# Patient Record
Sex: Male | Born: 1935 | Race: White | Hispanic: No | Marital: Married | Smoking: Former smoker
Health system: Southern US, Community
[De-identification: ages and names within clinical notes are randomized; demographics above are authoritative.]

## PROBLEM LIST (undated history)

## (undated) DIAGNOSIS — F0390 Unspecified dementia without behavioral disturbance: Secondary | ICD-10-CM

## (undated) DIAGNOSIS — K2270 Barrett's esophagus without dysplasia: Secondary | ICD-10-CM

## (undated) DIAGNOSIS — J45909 Unspecified asthma, uncomplicated: Secondary | ICD-10-CM

## (undated) DIAGNOSIS — I499 Cardiac arrhythmia, unspecified: Secondary | ICD-10-CM

## (undated) DIAGNOSIS — F1910 Other psychoactive substance abuse, uncomplicated: Secondary | ICD-10-CM

## (undated) DIAGNOSIS — J449 Chronic obstructive pulmonary disease, unspecified: Secondary | ICD-10-CM

## (undated) DIAGNOSIS — M549 Dorsalgia, unspecified: Secondary | ICD-10-CM

## (undated) DIAGNOSIS — R1310 Dysphagia, unspecified: Secondary | ICD-10-CM

## (undated) DIAGNOSIS — K219 Gastro-esophageal reflux disease without esophagitis: Secondary | ICD-10-CM

## (undated) DIAGNOSIS — F32A Depression, unspecified: Secondary | ICD-10-CM

## (undated) DIAGNOSIS — I509 Heart failure, unspecified: Secondary | ICD-10-CM

## (undated) DIAGNOSIS — C801 Malignant (primary) neoplasm, unspecified: Secondary | ICD-10-CM

## (undated) DIAGNOSIS — F329 Major depressive disorder, single episode, unspecified: Secondary | ICD-10-CM

## (undated) DIAGNOSIS — I10 Essential (primary) hypertension: Secondary | ICD-10-CM

## (undated) DIAGNOSIS — Z950 Presence of cardiac pacemaker: Secondary | ICD-10-CM

## (undated) DIAGNOSIS — F419 Anxiety disorder, unspecified: Secondary | ICD-10-CM

## (undated) HISTORY — PX: UPPER GASTROINTESTINAL ENDOSCOPY: SHX188

## (undated) HISTORY — PX: ANKLE SURGERY: SHX546

## (undated) HISTORY — PX: COLONOSCOPY: SHX174

## (undated) HISTORY — PX: PACEMAKER INSERTION: SHX728

## (undated) HISTORY — DX: Other psychoactive substance abuse, uncomplicated: F19.10

## (undated) HISTORY — DX: Major depressive disorder, single episode, unspecified: F32.9

## (undated) HISTORY — PX: TONSILLECTOMY: SUR1361

## (undated) HISTORY — DX: Depression, unspecified: F32.A

## (undated) HISTORY — DX: Gastro-esophageal reflux disease without esophagitis: K21.9

## (undated) HISTORY — DX: Malignant (primary) neoplasm, unspecified: C80.1

## (undated) HISTORY — DX: Anxiety disorder, unspecified: F41.9

## (undated) HISTORY — PX: SKIN GRAFT: SHX250

## (undated) HISTORY — PX: ESOPHAGEAL DILATION: SHX303

## (undated) HISTORY — PX: NECK SURGERY: SHX720

---

## 2007-03-27 DIAGNOSIS — K430 Incisional hernia with obstruction, without gangrene: Secondary | ICD-10-CM | POA: Insufficient documentation

## 2012-03-23 ENCOUNTER — Encounter (HOSPITAL_COMMUNITY): Payer: Self-pay | Admitting: Emergency Medicine

## 2012-03-23 ENCOUNTER — Emergency Department (HOSPITAL_COMMUNITY): Payer: Medicare Other

## 2012-03-23 ENCOUNTER — Observation Stay (HOSPITAL_COMMUNITY)
Admission: EM | Admit: 2012-03-23 | Discharge: 2012-03-24 | Disposition: A | Discharge status: Home / Self Care | Payer: Medicare Other | Attending: Internal Medicine | Admitting: Internal Medicine

## 2012-03-23 DIAGNOSIS — R4182 Altered mental status, unspecified: Secondary | ICD-10-CM | POA: Diagnosis present

## 2012-03-23 DIAGNOSIS — J440 Chronic obstructive pulmonary disease with acute lower respiratory infection: Secondary | ICD-10-CM | POA: Insufficient documentation

## 2012-03-23 DIAGNOSIS — M48061 Spinal stenosis, lumbar region without neurogenic claudication: Secondary | ICD-10-CM | POA: Diagnosis present

## 2012-03-23 DIAGNOSIS — Z950 Presence of cardiac pacemaker: Secondary | ICD-10-CM | POA: Insufficient documentation

## 2012-03-23 DIAGNOSIS — R404 Transient alteration of awareness: Secondary | ICD-10-CM | POA: Insufficient documentation

## 2012-03-23 DIAGNOSIS — J449 Chronic obstructive pulmonary disease, unspecified: Secondary | ICD-10-CM

## 2012-03-23 DIAGNOSIS — M5416 Radiculopathy, lumbar region: Secondary | ICD-10-CM

## 2012-03-23 DIAGNOSIS — S2249XA Multiple fractures of ribs, unspecified side, initial encounter for closed fracture: Principal | ICD-10-CM | POA: Diagnosis present

## 2012-03-23 DIAGNOSIS — Y92009 Unspecified place in unspecified non-institutional (private) residence as the place of occurrence of the external cause: Secondary | ICD-10-CM | POA: Insufficient documentation

## 2012-03-23 DIAGNOSIS — W108XXA Fall (on) (from) other stairs and steps, initial encounter: Secondary | ICD-10-CM | POA: Diagnosis present

## 2012-03-23 DIAGNOSIS — I10 Essential (primary) hypertension: Secondary | ICD-10-CM | POA: Diagnosis present

## 2012-03-23 DIAGNOSIS — Z981 Arthrodesis status: Secondary | ICD-10-CM | POA: Insufficient documentation

## 2012-03-23 DIAGNOSIS — J209 Acute bronchitis, unspecified: Secondary | ICD-10-CM | POA: Insufficient documentation

## 2012-03-23 DIAGNOSIS — F0390 Unspecified dementia without behavioral disturbance: Secondary | ICD-10-CM | POA: Insufficient documentation

## 2012-03-23 HISTORY — DX: Chronic obstructive pulmonary disease, unspecified: J44.9

## 2012-03-23 HISTORY — DX: Essential (primary) hypertension: I10

## 2012-03-23 HISTORY — DX: Unspecified dementia, unspecified severity, without behavioral disturbance, psychotic disturbance, mood disturbance, and anxiety: F03.90

## 2012-03-23 HISTORY — DX: Dorsalgia, unspecified: M54.9

## 2012-03-23 HISTORY — DX: Presence of cardiac pacemaker: Z95.0

## 2012-03-23 HISTORY — DX: Unspecified asthma, uncomplicated: J45.909

## 2012-03-23 LAB — CBC WITH DIFFERENTIAL/PLATELET
Basophils Absolute: 0.1 10*3/uL (ref 0.0–0.1)
Basophils Relative: 1 % (ref 0–1)
Eosinophils Absolute: 0.4 10*3/uL (ref 0.0–0.7)
Eosinophils Relative: 3 % (ref 0–5)
HCT: 41.5 % (ref 39.0–52.0)
Hemoglobin: 14.2 g/dL (ref 13.0–17.0)
Lymphocytes Relative: 29 % (ref 12–46)
Lymphs Abs: 3.2 10*3/uL (ref 0.7–4.0)
MCH: 31.8 pg (ref 26.0–34.0)
MCHC: 34.2 g/dL (ref 30.0–36.0)
MCV: 92.8 fL (ref 78.0–100.0)
Monocytes Absolute: 0.9 10*3/uL (ref 0.1–1.0)
Monocytes Relative: 8 % (ref 3–12)
Neutro Abs: 6.6 10*3/uL (ref 1.7–7.7)
Neutrophils Relative %: 59 % (ref 43–77)
Platelets: 328 10*3/uL (ref 150–400)
RBC: 4.47 MIL/uL (ref 4.22–5.81)
RDW: 15.3 % (ref 11.5–15.5)
WBC: 11.2 10*3/uL — ABNORMAL HIGH (ref 4.0–10.5)

## 2012-03-23 LAB — COMPREHENSIVE METABOLIC PANEL WITH GFR
ALT: 21 U/L (ref 0–53)
AST: 32 U/L (ref 0–37)
Albumin: 3.9 g/dL (ref 3.5–5.2)
Alkaline Phosphatase: 125 U/L — ABNORMAL HIGH (ref 39–117)
BUN: 19 mg/dL (ref 6–23)
CO2: 23 meq/L (ref 19–32)
Calcium: 10.2 mg/dL (ref 8.4–10.5)
Chloride: 97 meq/L (ref 96–112)
Creatinine, Ser: 0.82 mg/dL (ref 0.50–1.35)
GFR calc Af Amer: >90 mL/min
GFR calc non Af Amer: 84 mL/min — ABNORMAL LOW
Glucose, Bld: 126 mg/dL — ABNORMAL HIGH (ref 70–99)
Potassium: 4 meq/L (ref 3.5–5.1)
Sodium: 138 meq/L (ref 135–145)
Total Bilirubin: 0.3 mg/dL (ref 0.3–1.2)
Total Protein: 7.7 g/dL (ref 6.0–8.3)

## 2012-03-23 LAB — PROTIME-INR
INR: 1.03 (ref 0.00–1.49)
Prothrombin Time: 13.4 seconds (ref 11.6–15.2)

## 2012-03-23 MED ORDER — HYDROCODONE-ACETAMINOPHEN 5-325 MG PO TABS
2.0000 | ORAL_TABLET | ORAL | Status: DC | PRN
Start: 2012-03-23 — End: 2012-03-24

## 2012-03-23 MED ORDER — ALBUTEROL SULFATE (5 MG/ML) 0.5% IN NEBU
5.0000 mg | INHALATION_SOLUTION | Freq: Once | RESPIRATORY_TRACT | Status: AC
  Administered 2012-03-23: 5 mg via RESPIRATORY_TRACT

## 2012-03-23 MED ORDER — LORAZEPAM 2 MG/ML IJ SOLN
INTRAMUSCULAR | Status: AC
  Filled 2012-03-23: qty 1

## 2012-03-23 MED ORDER — LORAZEPAM 2 MG/ML IJ SOLN
0.5000 mg | Freq: Once | INTRAMUSCULAR | Status: AC
  Administered 2012-03-23: 0.5 mg via INTRAVENOUS

## 2012-03-23 MED ORDER — ALBUTEROL SULFATE (5 MG/ML) 0.5% IN NEBU
INHALATION_SOLUTION | RESPIRATORY_TRACT | Status: AC
  Filled 2012-03-23: qty 1

## 2012-03-23 NOTE — ED Notes (Signed)
Dr Delo to bedside 

## 2012-03-23 NOTE — ED Notes (Signed)
To ED from home via EMS for fall down unknown number of steps, ?LOC, pt able to asn questions, will follow commands, pt has dementia, pt moaning on arrival, pt vomited pta, no obvious truama or deformity, 208/116, ST 108, 18g left AC, is on LSB, CBG 105

## 2012-03-23 NOTE — ED Provider Notes (Signed)
History     CSN: [id number]  Arrival date & time 03/23/12  2140   First MD Initiated Contact with Patient 03/23/12 2147      Chief Complaint  Patient presents with  . Fall    (Consider location/radiation/quality/duration/timing/severity/associated sxs/prior treatment) Patient is a 78 y.o. male presenting with injury. The history is provided by the patient. No language interpreter was used.  Injury  The incident occurred just prior to arrival. The incident occurred at home. The injury mechanism was a fall. Context: stairs. The wounds were self-inflicted. No protective equipment was used. He came to the ER via EMS. There is an injury to the head. There is an injury to the chest. The pain is moderate. Associated symptoms include chest pain. Pertinent negatives include no abdominal pain, no nausea, no vomiting, no headaches and no cough.    Past Medical History  Diagnosis Date  . Asthma   . Dementia   . Back pain   . COPD (chronic obstructive pulmonary disease)   . Hypertension   . Pacemaker     Past Surgical History  Procedure Date  . Neck surgery     No family history on file.  History  Substance Use Topics  . Smoking status: Never Smoker   . Smokeless tobacco: Not on file  . Alcohol Use: Yes      Review of Systems  Constitutional: Negative for fever and chills.  HENT: Negative for congestion and sore throat.   Respiratory: Positive for shortness of breath. Negative for cough.   Cardiovascular: Positive for chest pain. Negative for leg swelling.  Gastrointestinal: Negative for nausea, vomiting, abdominal pain, diarrhea and constipation.  Genitourinary: Negative for dysuria and frequency.  Musculoskeletal: Positive for back pain.  Skin: Negative for color change and rash.  Neurological: Negative for dizziness and headaches.  Psychiatric/Behavioral: Negative for confusion and agitation.  All other systems reviewed and are negative.    Allergies  Review of  patient's allergies indicates not on file.  Home Medications  No current outpatient prescriptions on file.  BP 210/100  Pulse 114  Temp 96.6 F (35.9 C) (Rectal)  Resp 20  SpO2 96%  Physical Exam  Constitutional: He is oriented to person, place, and time. He appears well-developed and well-nourished. No distress.  HENT:  Head: Normocephalic and atraumatic.  Eyes: EOM are normal. Pupils are equal, round, and reactive to light.  Neck: Normal range of motion. Neck supple.  Cardiovascular: Normal rate and regular rhythm.     Pulmonary/Chest: Effort normal. No respiratory distress.  Abdominal: Soft. He exhibits no distension.  Musculoskeletal: Normal range of motion. He exhibits no edema.  Neurological: He is alert and oriented to person, place, and time. He has normal strength. No cranial nerve deficit. GCS eye subscore is 4. GCS verbal subscore is 4. GCS motor subscore is 6.  Skin: Skin is warm and dry.  Psychiatric: He has a normal mood and affect. His behavior is normal.    ED Course  Procedures (including critical care time)   Labs Reviewed  PROTIME-INR  CBC WITH DIFFERENTIAL  COMPREHENSIVE METABOLIC PANEL   Results for orders placed during the hospital encounter of 03/23/12  PROTIME-INR      Component Value Range   Prothrombin Time 13.4  11.6 - 15.2 seconds   INR 1.03  0.00 - 1.49  CBC WITH DIFFERENTIAL      Component Value Range   WBC 11.2 (*) 4.0 - 10.5 K/uL   RBC 4.47  4.22 -  5.81 MIL/uL   Hemoglobin 14.2  13.0 - 17.0 g/dL   HCT 16.1  09.6 - 04.5 %   MCV 92.8  78.0 - 100.0 fL   MCH 31.8  26.0 - 34.0 pg   MCHC 34.2  30.0 - 36.0 g/dL   RDW 40.9  81.1 - 91.4 %   Platelets 328  150 - 400 K/uL   Neutrophils Relative 59  43 - 77 %   Neutro Abs 6.6  1.7 - 7.7 K/uL   Lymphocytes Relative 29  12 - 46 %   Lymphs Abs 3.2  0.7 - 4.0 K/uL   Monocytes Relative 8  3 - 12 %   Monocytes Absolute 0.9  0.1 - 1.0 K/uL   Eosinophils Relative 3  0 - 5 %   Eosinophils  Absolute 0.4  0.0 - 0.7 K/uL   Basophils Relative 1  0 - 1 %   Basophils Absolute 0.1  0.0 - 0.1 K/uL  COMPREHENSIVE METABOLIC PANEL      Component Value Range   Sodium 138  135 - 145 mEq/L   Potassium 4.0  3.5 - 5.1 mEq/L   Chloride 97  96 - 112 mEq/L   CO2 23  19 - 32 mEq/L   Glucose, Bld 126 (*) 70 - 99 mg/dL   BUN 19  6 - 23 mg/dL   Creatinine, Ser 7.82  0.50 - 1.35 mg/dL   Calcium 95.6  8.4 - 21.3 mg/dL   Total Protein 7.7  6.0 - 8.3 g/dL   Albumin 3.9  3.5 - 5.2 g/dL   AST 32  0 - 37 U/L   ALT 21  0 - 53 U/L   Alkaline Phosphatase 125 (*) 39 - 117 U/L   Total Bilirubin 0.3  0.3 - 1.2 mg/dL   GFR calc non Af Amer 84 (*) >90 mL/min   GFR calc Af Amer >90  >90 mL/min    CT Head Wo Contrast (Final result)   Result time:03/23/12 2253    Final result by Rad Results In Interface (03/23/12 22:53:35)    Narrative:   *RADIOLOGY REPORT*  Clinical Data: Status post fall down steps; question of loss of consciousness. Vomiting. Concern for head or cervical spine injury.  CT HEAD WITHOUT CONTRAST AND CT CERVICAL SPINE WITHOUT CONTRAST  Technique: Multidetector CT imaging of the head and cervical spine was performed following the standard protocol without intravenous contrast. Multiplanar CT image reconstructions of the cervical spine were also generated.  Comparison: None  CT HEAD  Findings: There is no evidence of acute infarction, mass lesion, or intra- or extra-axial hemorrhage on CT. Evaluation is suboptimal due to motion artifact.  Prominence of the ventricles and sulci reflects mild cortical volume loss. Scattered periventricular white matter change likely reflects small vessel ischemic microangiopathy. Mild cerebellar atrophy is noted.  The brainstem and fourth ventricle are within normal limits. The basal ganglia are unremarkable in appearance. The cerebral hemispheres demonstrate grossly normal gray-white differentiation. No mass effect or midline shift is  seen.  There is no evidence of fracture; visualized osseous structures are unremarkable in appearance. The orbits are within normal limits. The paranasal sinuses and mastoid air cells are well-aerated. No significant soft tissue abnormalities are seen.  IMPRESSION:  1. No evidence of traumatic intracranial injury or fracture. 2. Mild cortical volume loss and scattered small vessel ischemic microangiopathy.  CT CERVICAL SPINE  Findings: There is no evidence of fracture or subluxation. Vertebral bodies demonstrate normal height and alignment.  The patient is status post anterior cervical spinal fusion at C3-C5, with an associated spacer element and partial resection of C4. The patient is also status post posterior cervical spinal fusion at C3- C7, with associated decompression. Prevertebral soft tissues are within normal limits. The right-sided screw at C7 appears to extend just barely into the right-sided neural foramen.  The visualized portions of the thyroid gland are grossly unremarkable in appearance. The visualized lung apices are clear. Calcification is noted at the carotid bifurcations bilaterally, extending more superiorly on the right side.  IMPRESSION:  1. No evidence of fracture or subluxation along the cervical spine. 2. Postoperative changes noted along the cervical spine, with fusion from C3-C7 and associated decompression. 3. Calcification at the carotid bifurcations bilaterally, extending more superiorly on the right side. Carotid ultrasound would be helpful for further evaluation, when and as deemed clinically appropriate.   Original Report Authenticated By: Tonia Ghent, M.D.             CT Cervical Spine Wo Contrast (Final result)   Result time:03/23/12 2253    Final result by Rad Results In Interface (03/23/12 22:53:35)    Narrative:   *RADIOLOGY REPORT*  Clinical Data: Status post fall down steps; question of loss of consciousness. Vomiting.  Concern for head or cervical spine injury.  CT HEAD WITHOUT CONTRAST AND CT CERVICAL SPINE WITHOUT CONTRAST  Technique: Multidetector CT imaging of the head and cervical spine was performed following the standard protocol without intravenous contrast. Multiplanar CT image reconstructions of the cervical spine were also generated.  Comparison: None  CT HEAD  Findings: There is no evidence of acute infarction, mass lesion, or intra- or extra-axial hemorrhage on CT. Evaluation is suboptimal due to motion artifact.  Prominence of the ventricles and sulci reflects mild cortical volume loss. Scattered periventricular white matter change likely reflects small vessel ischemic microangiopathy. Mild cerebellar atrophy is noted.  The brainstem and fourth ventricle are within normal limits. The basal ganglia are unremarkable in appearance. The cerebral hemispheres demonstrate grossly normal gray-white differentiation. No mass effect or midline shift is seen.  There is no evidence of fracture; visualized osseous structures are unremarkable in appearance. The orbits are within normal limits. The paranasal sinuses and mastoid air cells are well-aerated. No significant soft tissue abnormalities are seen.  IMPRESSION:  1. No evidence of traumatic intracranial injury or fracture. 2. Mild cortical volume loss and scattered small vessel ischemic microangiopathy.  CT CERVICAL SPINE  Findings: There is no evidence of fracture or subluxation. Vertebral bodies demonstrate normal height and alignment. The patient is status post anterior cervical spinal fusion at C3-C5, with an associated spacer element and partial resection of C4. The patient is also status post posterior cervical spinal fusion at C3- C7, with associated decompression. Prevertebral soft tissues are within normal limits. The right-sided screw at C7 appears to extend just barely into the right-sided neural foramen.  The  visualized portions of the thyroid gland are grossly unremarkable in appearance. The visualized lung apices are clear. Calcification is noted at the carotid bifurcations bilaterally, extending more superiorly on the right side.  IMPRESSION:  1. No evidence of fracture or subluxation along the cervical spine. 2. Postoperative changes noted along the cervical spine, with fusion from C3-C7 and associated decompression. 3. Calcification at the carotid bifurcations bilaterally, extending more superiorly on the right side. Carotid ultrasound would be helpful for further evaluation, when and as deemed clinically appropriate.   Original Report Authenticated By: Tonia Ghent, M.D.  DG Pelvis Portable (Final result)   Result time:03/23/12 2231    Final result by Rad Results In Interface (03/23/12 22:31:25)    Narrative:   *RADIOLOGY REPORT*  Clinical Data: Larey Seat down stairs, shortness of breath, asthma, COPD, hypertension, and pacemaker  PORTABLE PELVIS  Comparison: Portable exam 2214 hours without priors for comparison  Findings: Diffuse osseous demineralization. Symmetric hip and SI joints. No acute fracture, dislocation, or bone destruction. Corticated deformity at the lateral margin of the right iliac wing question sequela of prior surgery. Scattered vascular calcifications.  IMPRESSION: Osseous demineralization. No definite acute osseous abnormalities.   Original Report Authenticated By: Ulyses Southward, M.D.             DG Chest Portable 1 View (Final result)   Result time:03/23/12 2229    Final result by Rad Results In Interface (03/23/12 22:29:15)    Narrative:   *RADIOLOGY REPORT*  Clinical Data: Status post fall down stairs; shortness of breath. History of asthma.  PORTABLE CHEST - 1 VIEW  Comparison: None.  Findings: The lungs are well-aerated and clear. There is no evidence of focal opacification, pleural effusion or  pneumothorax.  The cardiomediastinal silhouette is borderline normal in size; a pacemaker is noted overlying the left chest wall, with leads ending overlying the right atrium and right ventricle. There appear to be mildly displaced fractures of the left lateral seventh and eighth ribs.  IMPRESSION:  1. Apparent mildly displaced fractures of the left lateral seventh and eighth ribs. 2. No acute cardiopulmonary process seen.   Original Report Authenticated By: Tonia Ghent, M.D.     No results found.   No diagnosis found.    MDM  Pt sustained un witnessed Fall - approx 15 stairs, + LOC. Vitals stable en route. On arrival - GCS 14, protecting airway, normotensive, head atraumatic, no signs of trauma to chest/abd/pelvis/ext. Plan for CT head/neck, xray chest and pelvis. Will check coags, cmp, cbc.   Course: reassessed, vitals stable, CT head - NAICA, CT neck - no acute fracture or malalignment. CXR reveals Left 7-8 rib fx. Pelvis - NAF. Labs unremarkable. D/w trauma surgery and medicine and pt admitted for obs status 2/2 persistent AMS. Remains hemodynamically stable, persistent tachycardia and mild hypoxia sats 95-92% on RA.   1. Multiple rib fractures   2. Fall down steps   3. Altered mental status            Audelia Hives, MD 03/24/12 [phone]

## 2012-03-23 NOTE — ED Notes (Signed)
Pt to CT with RN accompanyment and returned with RN

## 2012-03-23 NOTE — ED Notes (Signed)
Wife sts pt has been drinking tonight and that "he drinks a lot" - EMS also reported ETOH pta

## 2012-03-23 NOTE — Consult Note (Signed)
Reason for Consult: rib fx Referring Physician: Dr. Despina Arias Jerry Kerr is an 78 y.o. male.  HPI: s/p fall down stairs.  Pt was worked up in ED and found to have 2 rib fx that were minimally displaced.  Pt was able to breathe deep and cough without pain.  Pt was saturating well with no work of breathing.  Past Medical History  Diagnosis Date  . Asthma   . Dementia   . Back pain   . COPD (chronic obstructive pulmonary disease)   . Hypertension   . Pacemaker     Past Surgical History  Procedure Date  . Neck surgery     No family history on file.  Social History:  reports that he has never smoked. He does not have any smokeless tobacco history on file. He reports that he drinks alcohol. His drug history not on file.  Allergies: No Known Allergies  Medications: I have reviewed the patient's current medications.  Results for orders placed during the hospital encounter of 03/23/12 (from the past 48 hour(s))  PROTIME-INR     Status: Normal   Collection Time   03/23/12  9:48 PM      Component Value Range Comment   Prothrombin Time 13.4  11.6 - 15.2 seconds    INR 1.03  0.00 - 1.49   CBC WITH DIFFERENTIAL     Status: Abnormal   Collection Time   03/23/12  9:48 PM      Component Value Range Comment   WBC 11.2 (*) 4.0 - 10.5 K/uL    RBC 4.47  4.22 - 5.81 MIL/uL    Hemoglobin 14.2  13.0 - 17.0 g/dL    HCT 16.1  09.6 - 04.5 %    MCV 92.8  78.0 - 100.0 fL    MCH 31.8  26.0 - 34.0 pg    MCHC 34.2  30.0 - 36.0 g/dL    RDW 40.9  81.1 - 91.4 %    Platelets 328  150 - 400 K/uL    Neutrophils Relative 59  43 - 77 %    Neutro Abs 6.6  1.7 - 7.7 K/uL    Lymphocytes Relative 29  12 - 46 %    Lymphs Abs 3.2  0.7 - 4.0 K/uL    Monocytes Relative 8  3 - 12 %    Monocytes Absolute 0.9  0.1 - 1.0 K/uL    Eosinophils Relative 3  0 - 5 %    Eosinophils Absolute 0.4  0.0 - 0.7 K/uL    Basophils Relative 1  0 - 1 %    Basophils Absolute 0.1  0.0 - 0.1 K/uL   COMPREHENSIVE METABOLIC PANEL      Status: Abnormal   Collection Time   03/23/12  9:48 PM      Component Value Range Comment   Sodium 138  135 - 145 mEq/L    Potassium 4.0  3.5 - 5.1 mEq/L    Chloride 97  96 - 112 mEq/L    CO2 23  19 - 32 mEq/L    Glucose, Bld 126 (*) 70 - 99 mg/dL    BUN 19  6 - 23 mg/dL    Creatinine, Ser 7.82  0.50 - 1.35 mg/dL    Calcium 95.6  8.4 - 10.5 mg/dL    Total Protein 7.7  6.0 - 8.3 g/dL    Albumin 3.9  3.5 - 5.2 g/dL    AST 32  0 - 37 U/L  ALT 21  0 - 53 U/L    Alkaline Phosphatase 125 (*) 39 - 117 U/L    Total Bilirubin 0.3  0.3 - 1.2 mg/dL    GFR calc non Af Amer 84 (*) >90 mL/min    GFR calc Af Amer >90  >90 mL/min     Ct Head Wo Contrast  03/23/2012  *RADIOLOGY REPORT*  Clinical Data:  Status post fall down steps; question of loss of consciousness.  Vomiting.  Concern for head or cervical spine injury.  CT HEAD WITHOUT CONTRAST AND CT CERVICAL SPINE WITHOUT CONTRAST  Technique:  Multidetector CT imaging of the head and cervical spine was performed following the standard protocol without intravenous contrast.  Multiplanar CT image reconstructions of the cervical spine were also generated.  Comparison: None  CT HEAD  Findings: There is no evidence of acute infarction, mass lesion, or intra- or extra-axial hemorrhage on CT.  Evaluation is suboptimal due to motion artifact.  Prominence of the ventricles and sulci reflects mild cortical volume loss.  Scattered periventricular white matter change likely reflects small vessel ischemic microangiopathy.  Mild cerebellar atrophy is noted.  The brainstem and fourth ventricle are within normal limits.  The basal ganglia are unremarkable in appearance.  The cerebral hemispheres demonstrate grossly normal gray-white differentiation. No mass effect or midline shift is seen.  There is no evidence of fracture; visualized osseous structures are unremarkable in appearance.  The orbits are within normal limits. The paranasal sinuses and mastoid air cells are  well-aerated.  No significant soft tissue abnormalities are seen.  IMPRESSION:  1.  No evidence of traumatic intracranial injury or fracture. 2.  Mild cortical volume loss and scattered small vessel ischemic microangiopathy.  CT CERVICAL SPINE  Findings: There is no evidence of fracture or subluxation. Vertebral bodies demonstrate normal height and alignment.  The patient is status post anterior cervical spinal fusion at C3-C5, with an associated spacer element and partial resection of C4.  The patient is also status post posterior cervical spinal fusion at C3- C7, with associated decompression.  Prevertebral soft tissues are within normal limits.  The right-sided screw at C7 appears to extend just barely into the right-sided neural foramen.  The visualized portions of the thyroid gland are grossly unremarkable in appearance.  The visualized lung apices are clear. Calcification is noted at the carotid bifurcations bilaterally, extending more superiorly on the right side.  IMPRESSION:  1.  No evidence of fracture or subluxation along the cervical spine. 2.  Postoperative changes noted along the cervical spine, with fusion from C3-C7 and associated decompression. 3.  Calcification at the carotid bifurcations bilaterally, extending more superiorly on the right side.  Carotid ultrasound would be helpful for further evaluation, when and as deemed clinically appropriate.   Original Report Authenticated By: Tonia Ghent, M.D.    Ct Cervical Spine Wo Contrast  03/23/2012  *RADIOLOGY REPORT*  Clinical Data:  Status post fall down steps; question of loss of consciousness.  Vomiting.  Concern for head or cervical spine injury.  CT HEAD WITHOUT CONTRAST AND CT CERVICAL SPINE WITHOUT CONTRAST  Technique:  Multidetector CT imaging of the head and cervical spine was performed following the standard protocol without intravenous contrast.  Multiplanar CT image reconstructions of the cervical spine were also generated.   Comparison: None  CT HEAD  Findings: There is no evidence of acute infarction, mass lesion, or intra- or extra-axial hemorrhage on CT.  Evaluation is suboptimal due to motion artifact.  Prominence of  the ventricles and sulci reflects mild cortical volume loss.  Scattered periventricular white matter change likely reflects small vessel ischemic microangiopathy.  Mild cerebellar atrophy is noted.  The brainstem and fourth ventricle are within normal limits.  The basal ganglia are unremarkable in appearance.  The cerebral hemispheres demonstrate grossly normal gray-white differentiation. No mass effect or midline shift is seen.  There is no evidence of fracture; visualized osseous structures are unremarkable in appearance.  The orbits are within normal limits. The paranasal sinuses and mastoid air cells are well-aerated.  No significant soft tissue abnormalities are seen.  IMPRESSION:  1.  No evidence of traumatic intracranial injury or fracture. 2.  Mild cortical volume loss and scattered small vessel ischemic microangiopathy.  CT CERVICAL SPINE  Findings: There is no evidence of fracture or subluxation. Vertebral bodies demonstrate normal height and alignment.  The patient is status post anterior cervical spinal fusion at C3-C5, with an associated spacer element and partial resection of C4.  The patient is also status post posterior cervical spinal fusion at C3- C7, with associated decompression.  Prevertebral soft tissues are within normal limits.  The right-sided screw at C7 appears to extend just barely into the right-sided neural foramen.  The visualized portions of the thyroid gland are grossly unremarkable in appearance.  The visualized lung apices are clear. Calcification is noted at the carotid bifurcations bilaterally, extending more superiorly on the right side.  IMPRESSION:  1.  No evidence of fracture or subluxation along the cervical spine. 2.  Postoperative changes noted along the cervical spine, with  fusion from C3-C7 and associated decompression. 3.  Calcification at the carotid bifurcations bilaterally, extending more superiorly on the right side.  Carotid ultrasound would be helpful for further evaluation, when and as deemed clinically appropriate.   Original Report Authenticated By: Tonia Ghent, M.D.    Dg Pelvis Portable  03/23/2012  *RADIOLOGY REPORT*  Clinical Data: Larey Seat down stairs, shortness of breath, asthma, COPD, hypertension, and pacemaker  PORTABLE PELVIS  Comparison: Portable exam 2214 hours without priors for comparison  Findings: Diffuse osseous demineralization. Symmetric hip and SI joints. No acute fracture, dislocation, or bone destruction. Corticated deformity at the lateral margin of the right iliac wing question sequela of prior surgery. Scattered vascular calcifications.  IMPRESSION: Osseous demineralization. No definite acute osseous abnormalities.   Original Report Authenticated By: Ulyses Southward, M.D.    Dg Chest Portable 1 View  03/23/2012  *RADIOLOGY REPORT*  Clinical Data: Status post fall down stairs; shortness of breath. History of asthma.  PORTABLE CHEST - 1 VIEW  Comparison: None.  Findings: The lungs are well-aerated and clear.  There is no evidence of focal opacification, pleural effusion or pneumothorax.  The cardiomediastinal silhouette is borderline normal in size; a pacemaker is noted overlying the left chest wall, with leads ending overlying the right atrium and right ventricle.  There appear to be mildly displaced fractures of the left lateral seventh and eighth ribs.  IMPRESSION:  1.  Apparent mildly displaced fractures of the left lateral seventh and eighth ribs. 2.  No acute cardiopulmonary process seen.   Original Report Authenticated By: Tonia Ghent, M.D.     Review of Systems  Constitutional: Negative for fever and chills.  HENT: Negative.   Respiratory: Negative.   Cardiovascular: Negative.   Musculoskeletal: Negative.   Skin: Negative.     Blood pressure 169/90, pulse 109, temperature 96.6 F (35.9 C), temperature source Rectal, resp. rate 22, SpO2 93.00%. Physical Exam  Constitutional: He  appears well-developed and well-nourished.  HENT:  Head: Normocephalic and atraumatic.  Eyes: Conjunctivae normal are normal. Pupils are equal, round, and reactive to light.  Neck: Normal range of motion. Neck supple.  Cardiovascular: Normal rate and normal heart sounds.   Respiratory: Effort normal and breath sounds normal.  GI: Soft. Bowel sounds are normal.  Musculoskeletal: Normal range of motion.    Assessment/Plan: 70 M with 2 rib fxs  -We will admit the pt to OBS, pain control. -Home in AM if pain control good and satting well. -Medicine to see the patient to assist with his multp medical problems.  Marigene Ehlers., Signa Cheek 03/23/2012, 11:48 PM

## 2012-03-24 ENCOUNTER — Encounter (HOSPITAL_COMMUNITY): Payer: Self-pay | Admitting: Internal Medicine

## 2012-03-24 DIAGNOSIS — R4182 Altered mental status, unspecified: Secondary | ICD-10-CM

## 2012-03-24 DIAGNOSIS — M5416 Radiculopathy, lumbar region: Secondary | ICD-10-CM | POA: Diagnosis present

## 2012-03-24 DIAGNOSIS — M48061 Spinal stenosis, lumbar region without neurogenic claudication: Secondary | ICD-10-CM

## 2012-03-24 DIAGNOSIS — S2249XA Multiple fractures of ribs, unspecified side, initial encounter for closed fracture: Secondary | ICD-10-CM

## 2012-03-24 DIAGNOSIS — I10 Essential (primary) hypertension: Secondary | ICD-10-CM | POA: Diagnosis present

## 2012-03-24 DIAGNOSIS — W108XXA Fall (on) (from) other stairs and steps, initial encounter: Secondary | ICD-10-CM

## 2012-03-24 DIAGNOSIS — IMO0002 Reserved for concepts with insufficient information to code with codable children: Secondary | ICD-10-CM

## 2012-03-24 DIAGNOSIS — J449 Chronic obstructive pulmonary disease, unspecified: Secondary | ICD-10-CM

## 2012-03-24 DIAGNOSIS — D62 Acute posthemorrhagic anemia: Secondary | ICD-10-CM

## 2012-03-24 LAB — BASIC METABOLIC PANEL
BUN: 15 mg/dL (ref 6–23)
Chloride: 100 mEq/L (ref 96–112)
Creatinine, Ser: 0.76 mg/dL (ref 0.50–1.35)
GFR calc Af Amer: >90 mL/min (ref >90)
Glucose, Bld: 140 mg/dL — ABNORMAL HIGH (ref 70–99)
Potassium: 3.9 mEq/L (ref 3.5–5.1)

## 2012-03-24 LAB — CBC
HCT: 34.1 % — ABNORMAL LOW (ref 39.0–52.0)
Hemoglobin: 11.9 g/dL — ABNORMAL LOW (ref 13.0–17.0)
MCHC: 34.9 g/dL (ref 30.0–36.0)
MCV: 91.7 fL (ref 78.0–100.0)
RDW: 15.4 % (ref 11.5–15.5)
WBC: 11 10*3/uL — ABNORMAL HIGH (ref 4.0–10.5)

## 2012-03-24 LAB — URINALYSIS, ROUTINE W REFLEX MICROSCOPIC
Glucose, UA: NEGATIVE mg/dL
Leukocytes, UA: NEGATIVE
Specific Gravity, Urine: 1.016 (ref 1.005–1.030)
pH: 5 (ref 5.0–8.0)

## 2012-03-24 LAB — TROPONIN I: Troponin I: <0.3 ng/mL (ref <0.30)

## 2012-03-24 LAB — URINE MICROSCOPIC-ADD ON

## 2012-03-24 MED ORDER — ONDANSETRON HCL 4 MG PO TABS: 4.0000 mg | ORAL_TABLET | Freq: Four times a day (QID) | ORAL | Status: DC | PRN

## 2012-03-24 MED ORDER — ZOLPIDEM TARTRATE 5 MG PO TABS: 5.0000 mg | ORAL_TABLET | Freq: Every evening | ORAL | Status: DC | PRN

## 2012-03-24 MED ORDER — SODIUM CHLORIDE 0.9 % IV SOLN: INTRAVENOUS | Status: DC

## 2012-03-24 MED ORDER — ACETAMINOPHEN 325 MG PO TABS: 650.0000 mg | ORAL_TABLET | Freq: Four times a day (QID) | ORAL | Status: DC | PRN

## 2012-03-24 MED ORDER — LORAZEPAM 2 MG/ML IJ SOLN
0.5000 mg | Freq: Once | INTRAMUSCULAR | Status: AC
  Administered 2012-03-24: 0.5 mg via INTRAVENOUS
  Filled 2012-03-24: qty 1

## 2012-03-24 MED ORDER — ONDANSETRON HCL 4 MG/2ML IJ SOLN: 4.0000 mg | Freq: Four times a day (QID) | INTRAMUSCULAR | Status: DC | PRN

## 2012-03-24 MED ORDER — HYDROCODONE-ACETAMINOPHEN 5-325 MG PO TABS: 2.0000 | ORAL_TABLET | ORAL | Status: DC | PRN

## 2012-03-24 MED ORDER — AZITHROMYCIN 500 MG PO TABS
500.0000 mg | ORAL_TABLET | Freq: Every day | ORAL | Status: DC
  Administered 2012-03-24: 500 mg via ORAL
  Filled 2012-03-24: qty 1

## 2012-03-24 MED ORDER — GUAIFENESIN ER 600 MG PO TB12: 1200.0000 mg | ORAL_TABLET | Freq: Two times a day (BID) | ORAL | Status: AC

## 2012-03-24 MED ORDER — ACETAMINOPHEN 650 MG RE SUPP: 650.0000 mg | Freq: Four times a day (QID) | RECTAL | Status: DC | PRN

## 2012-03-24 MED ORDER — ENOXAPARIN SODIUM 40 MG/0.4ML ~~LOC~~ SOLN
40.0000 mg | SUBCUTANEOUS | Status: DC
  Filled 2012-03-24: qty 0.4

## 2012-03-24 MED ORDER — LEVALBUTEROL HCL 1.25 MG/0.5ML IN NEBU
1.2500 mg | INHALATION_SOLUTION | Freq: Three times a day (TID) | RESPIRATORY_TRACT | Status: DC
  Administered 2012-03-24: 1.25 mg via RESPIRATORY_TRACT
  Filled 2012-03-24 (×5): qty 0.5

## 2012-03-24 MED ORDER — OXYCODONE HCL 5 MG PO TABS: 5.0000 mg | ORAL_TABLET | ORAL | Status: DC | PRN

## 2012-03-24 MED ORDER — OXYCODONE HCL 5 MG PO TABS
5.0000 mg | ORAL_TABLET | ORAL | Status: DC | PRN
  Administered 2012-03-24: 5 mg via ORAL
  Filled 2012-03-24: qty 1

## 2012-03-24 MED ORDER — AZITHROMYCIN 500 MG PO TABS: 500.0000 mg | ORAL_TABLET | Freq: Every day | ORAL | Status: DC

## 2012-03-24 MED ORDER — IPRATROPIUM-ALBUTEROL 18-103 MCG/ACT IN AERO: 2.0000 | INHALATION_SPRAY | Freq: Four times a day (QID) | RESPIRATORY_TRACT | Status: DC | PRN

## 2012-03-24 MED ORDER — HYDROMORPHONE HCL PF 1 MG/ML IJ SOLN: 0.5000 mg | INTRAMUSCULAR | Status: DC | PRN

## 2012-03-24 MED ORDER — GUAIFENESIN ER 600 MG PO TB12
1200.0000 mg | ORAL_TABLET | Freq: Two times a day (BID) | ORAL | Status: DC
  Filled 2012-03-24 (×2): qty 2

## 2012-03-24 MED ORDER — ALUM & MAG HYDROXIDE-SIMETH 200-200-20 MG/5ML PO SUSP: 30.0000 mL | Freq: Four times a day (QID) | ORAL | Status: DC | PRN

## 2012-03-24 NOTE — Progress Notes (Signed)
Pt discharged, ordered rolling walker.  Pt refused to wait for walker, discharged home without equipment per patients request.

## 2012-03-24 NOTE — Discharge Summary (Signed)
Discharge diagnosis  Multiple rib fractures  Fall down steps  Altered mental status  COPD (chronic obstructive pulmonary disease)  Hypertension  Spinal stenosis of lumbar region with radiculopathy  Acute bronchitis   Chief Complaint: Larey Seat down Stairs, now with rib pain  HPI: Jerry Kerr is a 78 y.o. male who was brought to the ED after falling down 15 stairs. He had complaints of severe pain on the left side. He has a history of Dementia so his wife gives the history and reports that he may have fallen due to back pain from spinal stenosis and arthritis. In the ED he was found to have fractures of the left lateral 7th and 8th ribs. The EDP spoke to the Trauma Service about the patient, Trauma was   consulted.   Review of Systems: The patient denies anorexia, fever, weight loss, vision loss, decreased hearing, hoarseness, chest pain, syncope, dyspnea on exertion, peripheral edema, balance deficits, hemoptysis, abdominal pain, nausea, vomiting, diarrhea, constipation, hematemesis, melena, hematochezia, severe indigestion/heartburn, hematuria, Dysuria, incontinence, genital sores, muscle weakness, suspicious skin lesions, transient blindness, difficulty walking, depression, unusual weight change, abnormal bleeding, enlarged lymph nodes, angioedema, and breast masses.  Past Medical History   Diagnosis  Date   .  Asthma    .  Dementia    .  Back pain    .  COPD (chronic obstructive pulmonary disease)    .  Hypertension    .  Pacemaker     Past Surgical History   Procedure  Date   .  Neck surgery     Medications:  HOME MEDS:  Prior to Admission medications   Medication  Sig  Start Date  End Date  Taking?  Authorizing Provider   HYDROcodone-acetaminophen (NORCO) 5-325 MG per tablet  Take 2 tablets by mouth every 4 (four) hours as needed for pain.  03/23/12    Audelia Hives, MD   Allergies:  No Known Allergies  Social History:  reports that he has never smoked. He does not have any  smokeless tobacco history on file. He reports that he drinks alcohol. His drug history not on file.  Family History:  CAD in Father  HTN in Mother  Mother had AAA  Physical Exam:  GEN: Pleasant 78 year old well nourished and well developed Caucasian Elderly examined and in no acute distress; cooperative with exam  Filed Vitals:    03/23/12 2230  03/23/12 2330  03/23/12 2351  03/24/12 0206   BP:  169/90   147/80  145/86   Pulse:  114  109  116  106   Temp:     98.6 F (37 C)   TempSrc:       Resp:  28  22  20  20    SpO2:  98%  93%  92%  96%   Blood pressure 145/86, pulse 106, temperature 98.6 F (37 C), temperature source Rectal, resp. rate 20, SpO2 96.00%.  PSYCH: He is alert and oriented x4; does not appear anxious does not appear depressed; affect is normal  HEENT: Normocephalic and Atraumatic, Mucous membranes pink; PERRLA; EOM intact; Fundi: Benign; No scleral icterus, Nares: Patent, Oropharynx: Clear, Edentulous or Fair Dentition, Neck: FROM, no cervical lymphadenopathy nor thyromegaly or carotid bruit; no JVD;  Breasts:: Not examined  CHEST WALL: No tenderness  CHEST: Decreased Breach Sounds, Diffuse Rhonchorus Breath Sounds (Upeer Airway Noise)  HEART: Regular rate and rhythm; no murmurs rubs or gallops  BACK: No kyphosis or scoliosis; no CVA tenderness  ABDOMEN: Positive Bowel Sounds, soft non-tender; no masses, no organomegaly.  Rectal Exam: Not done  EXTREMITIES: No bone or joint deformity; age-appropriate arthropathy of the hands and knees; no cyanosis, clubbing or edema; no ulcerations.  Genitalia: not examined  PULSES: 2+ and symmetric  SKIN: Normal hydration no rash or ulceration  CNS: Cranial nerves 2-12 grossly intact no focal neurologic deficit  Labs on Admission:  Basic Metabolic Panel:   Lab  03/23/12 2148   NA  138   K  4.0   CL  97   CO2  23   GLUCOSE  126*   BUN  19   CREATININE  0.82   CALCIUM  10.2   MG  --   PHOS  --    Liver Function Tests:    Lab  03/23/12 2148   AST  32   ALT  21   ALKPHOS  125*   BILITOT  0.3   PROT  7.7   ALBUMIN  3.9    No results found for this basename: LIPASE:5,AMYLASE:5 in the last 168 hours  No results found for this basename: AMMONIA:5 in the last 168 hours  CBC:   Lab  03/23/12 2148   WBC  11.2*   NEUTROABS  6.6   HGB  14.2   HCT  41.5   MCV  92.8   PLT  328    Cardiac Enzymes:  No results found for this basename: CKTOTAL:5,CKMB:5,CKMBINDEX:5,TROPONINI:5 in the last 168 hours  BNP (last 3 results)  No results found for this basename: PROBNP:3 in the last 8760 hours  CBG:  No results found for this basename: GLUCAP:5 in the last 168 hours  Radiological Exams on Admission:  Ct Head Wo Contrast  03/23/2012 *RADIOLOGY REPORT* Clinical Data: Status post fall down steps; question of loss of consciousness. Vomiting. Concern for head or cervical spine injury. CT HEAD WITHOUT CONTRAST AND CT CERVICAL SPINE WITHOUT CONTRAST Technique: Multidetector CT imaging of the head and cervical spine was performed following the standard protocol without intravenous contrast. Multiplanar CT image reconstructions of the cervical spine were also generated. Comparison: None CT HEAD Findings: There is no evidence of acute infarction, mass lesion, or intra- or extra-axial hemorrhage on CT. Evaluation is suboptimal due to motion artifact. Prominence of the ventricles and sulci reflects mild cortical volume loss. Scattered periventricular white matter change likely reflects small vessel ischemic microangiopathy. Mild cerebellar atrophy is noted. The brainstem and fourth ventricle are within normal limits. The basal ganglia are unremarkable in appearance. The cerebral hemispheres demonstrate grossly normal gray-white differentiation. No mass effect or midline shift is seen. There is no evidence of fracture; visualized osseous structures are unremarkable in appearance. The orbits are within normal limits. The paranasal  sinuses and mastoid air cells are well-aerated. No significant soft tissue abnormalities are seen. IMPRESSION: 1. No evidence of traumatic intracranial injury or fracture. 2. Mild cortical volume loss and scattered small vessel ischemic microangiopathy. CT CERVICAL SPINE Findings: There is no evidence of fracture or subluxation. Vertebral bodies demonstrate normal height and alignment. The patient is status post anterior cervical spinal fusion at C3-C5, with an associated spacer element and partial resection of C4. The patient is also status post posterior cervical spinal fusion at C3- C7, with associated decompression. Prevertebral soft tissues are within normal limits. The right-sided screw at C7 appears to extend just barely into the right-sided neural foramen. The visualized portions of the thyroid gland are grossly unremarkable in appearance. The visualized lung apices  are clear. Calcification is noted at the carotid bifurcations bilaterally, extending more superiorly on the right side. IMPRESSION: 1. No evidence of fracture or subluxation along the cervical spine. 2. Postoperative changes noted along the cervical spine, with fusion from C3-C7 and associated decompression. 3. Calcification at the carotid bifurcations bilaterally, extending more superiorly on the right side. Carotid ultrasound would be helpful for further evaluation, when and as deemed clinically appropriate. Original Report Authenticated By: Tonia Ghent, M.D.  Ct Cervical Spine Wo Contrast  03/23/2012 *RADIOLOGY REPORT* Clinical Data: Status post fall down steps; question of loss of consciousness. Vomiting. Concern for head or cervical spine injury. CT HEAD WITHOUT CONTRAST AND CT CERVICAL SPINE WITHOUT CONTRAST Technique: Multidetector CT imaging of the head and cervical spine was performed following the standard protocol without intravenous contrast. Multiplanar CT image reconstructions of the cervical spine were also generated.  Comparison: None CT HEAD Findings: There is no evidence of acute infarction, mass lesion, or intra- or extra-axial hemorrhage on CT. Evaluation is suboptimal due to motion artifact. Prominence of the ventricles and sulci reflects mild cortical volume loss. Scattered periventricular white matter change likely reflects small vessel ischemic microangiopathy. Mild cerebellar atrophy is noted. The brainstem and fourth ventricle are within normal limits. The basal ganglia are unremarkable in appearance. The cerebral hemispheres demonstrate grossly normal gray-white differentiation. No mass effect or midline shift is seen. There is no evidence of fracture; visualized osseous structures are unremarkable in appearance. The orbits are within normal limits. The paranasal sinuses and mastoid air cells are well-aerated. No significant soft tissue abnormalities are seen. IMPRESSION: 1. No evidence of traumatic intracranial injury or fracture. 2. Mild cortical volume loss and scattered small vessel ischemic microangiopathy. CT CERVICAL SPINE Findings: There is no evidence of fracture or subluxation. Vertebral bodies demonstrate normal height and alignment. The patient is status post anterior cervical spinal fusion at C3-C5, with an associated spacer element and partial resection of C4. The patient is also status post posterior cervical spinal fusion at C3- C7, with associated decompression. Prevertebral soft tissues are within normal limits. The right-sided screw at C7 appears to extend just barely into the right-sided neural foramen. The visualized portions of the thyroid gland are grossly unremarkable in appearance. The visualized lung apices are clear. Calcification is noted at the carotid bifurcations bilaterally, extending more superiorly on the right side. IMPRESSION: 1. No evidence of fracture or subluxation along the cervical spine. 2. Postoperative changes noted along the cervical spine, with fusion from C3-C7 and  associated decompression. 3. Calcification at the carotid bifurcations bilaterally, extending more superiorly on the right side. Carotid ultrasound would be helpful for further evaluation, when and as deemed clinically appropriate. Original Report Authenticated By: Tonia Ghent, M.D.  Dg Pelvis Portable  03/23/2012 *RADIOLOGY REPORT* Clinical Data: Larey Seat down stairs, shortness of breath, asthma, COPD, hypertension, and pacemaker PORTABLE PELVIS Comparison: Portable exam 2214 hours without priors for comparison Findings: Diffuse osseous demineralization. Symmetric hip and SI joints. No acute fracture, dislocation, or bone destruction. Corticated deformity at the lateral margin of the right iliac wing question sequela of prior surgery. Scattered vascular calcifications. IMPRESSION: Osseous demineralization. No definite acute osseous abnormalities. Original Report Authenticated By: Ulyses Southward, M.D.  Dg Chest Portable 1 View  03/23/2012 *RADIOLOGY REPORT* Clinical Data: Status post fall down stairs; shortness of breath. History of asthma. PORTABLE CHEST - 1 VIEW Comparison: None. Findings: The lungs are well-aerated and clear. There is no evidence of focal opacification, pleural effusion or pneumothorax. The cardiomediastinal silhouette  is borderline normal in size; a pacemaker is noted overlying the left chest wall, with leads ending overlying the right atrium and right ventricle. There appear to be mildly displaced fractures of the left lateral seventh and eighth ribs. IMPRESSION: 1. Apparent mildly displaced fractures of the left lateral seventh and eighth ribs. 2. No acute cardiopulmonary process seen. Original Report Authenticated By: Tonia Ghent, M.D.   EKG: Independently reviewed.     Hospital course #1 rib fracture Pulmonary toilet, treated conservatively with pain management incentive spirometry Patient was seen by the trauma team prior to discharge Patient was assessed by physical therapy because  of her fall risk He was given a rolling walker  #2 acute bronchitis The patient was started on azithromycin for 5 days He was also given Combivent inhaler prescription and Mucinex No steroids were given because of no wheezing  #3 normocytic anemia hemoglobin remained in the acceptable range without any suspicion for active bleeding

## 2012-03-24 NOTE — ED Provider Notes (Signed)
I saw and evaluated the patient, reviewed the resident's note and I agree with the findings and plan. The patient was brought here for eval after a fall down multiple steps.  He has a history of parkinsons and dementia.  He adds little to history due to this.  For this reason, a Level 5 caveat applies.    On exam, the patient is anxious and confused.  He is unable to cooperate.  The heart and lung exam are unremarkable.  The abd is benign.  He is ttp over the left ribs but breath sounds are equal.    The patient arrived by ems and was seen immediately upon arrival.  His nrb was changed to a nasal cannula and he was given ativan.  This settled him down significantly.  He was then sent for imaging studies which only revealed rib fractures.  He was evaluated by Dr. Derrell Lolling from trauma surgery who feels as though the patient is stable for admission to medicine.  Medicine contacted, patient admitted to them.  Geoffery Lyons, MD 03/24/12 [phone]

## 2012-03-24 NOTE — Progress Notes (Signed)
LOS: 1 day   Subjective: Pt feeling okay, pt with minimal pain, no significant SOB, normally takes albuterol 5-6x/day at home.  Pt didn't feel like eating this am.  Pt is up to chair.  Urinating well and BM yesterday.  No other complaints.  Objective: Vital signs in last 24 hours: Temp:  [96.6 F (35.9 C)-98.6 F (37 C)] 98.6 F (37 C) (12/25 0206) Pulse Rate:  [106-119] 106  (12/25 0206) Resp:  [20-28] 20  (12/25 0206) BP: (145-210)/(80-105) 145/86 mmHg (12/25 0206) SpO2:  [92 %-98 %] 97 % (12/25 0813) Last BM Date: 03/23/12  Lab Results:  CBC  Basename 03/24/12 0530 03/23/12 2148  WBC 11.0* 11.2*  HGB 11.9* 14.2  HCT 34.1* 41.5  PLT 286 328   BMET  Basename 03/24/12 0530 03/23/12 2148  NA 137 138  K 3.9 4.0  CL 100 97  CO2 21 23  GLUCOSE 140* 126*  BUN 15 19  CREATININE 0.76 0.82  CALCIUM 9.5 10.2    Imaging: Ct Head Wo Contrast  03/23/2012  *RADIOLOGY REPORT*  Clinical Data:  Status post fall down steps; question of loss of consciousness.  Vomiting.  Concern for head or cervical spine injury.  CT HEAD WITHOUT CONTRAST AND CT CERVICAL SPINE WITHOUT CONTRAST  Technique:  Multidetector CT imaging of the head and cervical spine was performed following the standard protocol without intravenous contrast.  Multiplanar CT image reconstructions of the cervical spine were also generated.  Comparison: None  CT HEAD  Findings: There is no evidence of acute infarction, mass lesion, or intra- or extra-axial hemorrhage on CT.  Evaluation is suboptimal due to motion artifact.  Prominence of the ventricles and sulci reflects mild cortical volume loss.  Scattered periventricular white matter change likely reflects small vessel ischemic microangiopathy.  Mild cerebellar atrophy is noted.  The brainstem and fourth ventricle are within normal limits.  The basal ganglia are unremarkable in appearance.  The cerebral hemispheres demonstrate grossly normal gray-white differentiation. No mass  effect or midline shift is seen.  There is no evidence of fracture; visualized osseous structures are unremarkable in appearance.  The orbits are within normal limits. The paranasal sinuses and mastoid air cells are well-aerated.  No significant soft tissue abnormalities are seen.  IMPRESSION:  1.  No evidence of traumatic intracranial injury or fracture. 2.  Mild cortical volume loss and scattered small vessel ischemic microangiopathy.  CT CERVICAL SPINE  Findings: There is no evidence of fracture or subluxation. Vertebral bodies demonstrate normal height and alignment.  The patient is status post anterior cervical spinal fusion at C3-C5, with an associated spacer element and partial resection of C4.  The patient is also status post posterior cervical spinal fusion at C3- C7, with associated decompression.  Prevertebral soft tissues are within normal limits.  The right-sided screw at C7 appears to extend just barely into the right-sided neural foramen.  The visualized portions of the thyroid gland are grossly unremarkable in appearance.  The visualized lung apices are clear. Calcification is noted at the carotid bifurcations bilaterally, extending more superiorly on the right side.  IMPRESSION:  1.  No evidence of fracture or subluxation along the cervical spine. 2.  Postoperative changes noted along the cervical spine, with fusion from C3-C7 and associated decompression. 3.  Calcification at the carotid bifurcations bilaterally, extending more superiorly on the right side.  Carotid ultrasound would be helpful for further evaluation, when and as deemed clinically appropriate.   Original Report Authenticated By:  Tonia Ghent, M.D.    Ct Cervical Spine Wo Contrast  03/23/2012  *RADIOLOGY REPORT*  Clinical Data:  Status post fall down steps; question of loss of consciousness.  Vomiting.  Concern for head or cervical spine injury.  CT HEAD WITHOUT CONTRAST AND CT CERVICAL SPINE WITHOUT CONTRAST  Technique:   Multidetector CT imaging of the head and cervical spine was performed following the standard protocol without intravenous contrast.  Multiplanar CT image reconstructions of the cervical spine were also generated.  Comparison: None  CT HEAD  Findings: There is no evidence of acute infarction, mass lesion, or intra- or extra-axial hemorrhage on CT.  Evaluation is suboptimal due to motion artifact.  Prominence of the ventricles and sulci reflects mild cortical volume loss.  Scattered periventricular white matter change likely reflects small vessel ischemic microangiopathy.  Mild cerebellar atrophy is noted.  The brainstem and fourth ventricle are within normal limits.  The basal ganglia are unremarkable in appearance.  The cerebral hemispheres demonstrate grossly normal gray-white differentiation. No mass effect or midline shift is seen.  There is no evidence of fracture; visualized osseous structures are unremarkable in appearance.  The orbits are within normal limits. The paranasal sinuses and mastoid air cells are well-aerated.  No significant soft tissue abnormalities are seen.  IMPRESSION:  1.  No evidence of traumatic intracranial injury or fracture. 2.  Mild cortical volume loss and scattered small vessel ischemic microangiopathy.  CT CERVICAL SPINE  Findings: There is no evidence of fracture or subluxation. Vertebral bodies demonstrate normal height and alignment.  The patient is status post anterior cervical spinal fusion at C3-C5, with an associated spacer element and partial resection of C4.  The patient is also status post posterior cervical spinal fusion at C3- C7, with associated decompression.  Prevertebral soft tissues are within normal limits.  The right-sided screw at C7 appears to extend just barely into the right-sided neural foramen.  The visualized portions of the thyroid gland are grossly unremarkable in appearance.  The visualized lung apices are clear. Calcification is noted at the carotid  bifurcations bilaterally, extending more superiorly on the right side.  IMPRESSION:  1.  No evidence of fracture or subluxation along the cervical spine. 2.  Postoperative changes noted along the cervical spine, with fusion from C3-C7 and associated decompression. 3.  Calcification at the carotid bifurcations bilaterally, extending more superiorly on the right side.  Carotid ultrasound would be helpful for further evaluation, when and as deemed clinically appropriate.   Original Report Authenticated By: Tonia Ghent, M.D.    Dg Pelvis Portable  03/23/2012  *RADIOLOGY REPORT*  Clinical Data: Larey Seat down stairs, shortness of breath, asthma, COPD, hypertension, and pacemaker  PORTABLE PELVIS  Comparison: Portable exam 2214 hours without priors for comparison  Findings: Diffuse osseous demineralization. Symmetric hip and SI joints. No acute fracture, dislocation, or bone destruction. Corticated deformity at the lateral margin of the right iliac wing question sequela of prior surgery. Scattered vascular calcifications.  IMPRESSION: Osseous demineralization. No definite acute osseous abnormalities.   Original Report Authenticated By: Ulyses Southward, M.D.    Dg Chest Portable 1 View  03/23/2012  *RADIOLOGY REPORT*  Clinical Data: Status post fall down stairs; shortness of breath. History of asthma.  PORTABLE CHEST - 1 VIEW  Comparison: None.  Findings: The lungs are well-aerated and clear.  There is no evidence of focal opacification, pleural effusion or pneumothorax.  The cardiomediastinal silhouette is borderline normal in size; a pacemaker is noted overlying the left chest  wall, with leads ending overlying the right atrium and right ventricle.  There appear to be mildly displaced fractures of the left lateral seventh and eighth ribs.  IMPRESSION:  1.  Apparent mildly displaced fractures of the left lateral seventh and eighth ribs. 2.  No acute cardiopulmonary process seen.   Original Report Authenticated By: Tonia Ghent, M.D.      PE: General appearance: alert, cooperative and no distress Resp: clear to auscultation bilaterally, low effort, but 1500 in IS Chest wall: no tenderness Cardio: regular rate and rhythm GI: soft, non-tender; bowel sounds normal; no masses,  no organomegaly Extremities: extremities normal, atraumatic, no cyanosis or edema   Assessment/Plan: 85 M s/p fall Left lateral 7th, 8th rib fxs - Pulmonary Toilet, up to 1500 today COPD - nebs, per patient uses albuterol 5-6x/day Multiple medical problems - per IM team ABL anemia - mild VTE - SCD's, Lovenox FEN - Pt with anorexia, Heart diet when able Dispo -- May be able to go home today if pain well controlled and eating, walking   Aris [place], [place] Pager: [phone] General Trauma PA Pager: [phone]   03/24/2012

## 2012-03-24 NOTE — H&P (Signed)
[facility] History and Physical  Jerry Kerr ZOX:[id number] DOB: 14-Nov-1933 DOA: 03/23/2012  Referring physician: EDP PCP: No primary provider on file.  Specialists:   Chief Complaint: Larey Seat down Stairs, now with rib pain   HPI: Jerry Kerr is a 78 y.o. male who was brought to the ED after falling down 15 stairs.  He had complaints of severe pain on the left side.   He has a history of Dementia so his wife gives the history and reports that he may have fallen due to back pain from spinal stenosis and arthritis.   In the ED he was found to have fractures of the left lateral 7th and 8th ribs.  The EDP spoke to the Trauma Service about the patient, Trauma was not consulted.    Review of Systems: The patient denies anorexia, fever, weight loss, vision loss, decreased hearing, hoarseness, chest pain, syncope, dyspnea on exertion, peripheral edema, balance deficits, hemoptysis, abdominal pain, nausea, vomiting, diarrhea, constipation, hematemesis, melena, hematochezia, severe indigestion/heartburn, hematuria,  Dysuria, incontinence, genital sores, muscle weakness, suspicious skin lesions, transient blindness, difficulty walking, depression, unusual weight change, abnormal bleeding, enlarged lymph nodes, angioedema, and breast masses.    Past Medical History  Diagnosis Date  . Asthma   . Dementia   . Back pain   . COPD (chronic obstructive pulmonary disease)   . Hypertension   . Pacemaker    Past Surgical History  Procedure Date  . Neck surgery     Medications:  HOME MEDS: Prior to Admission medications   Medication Sig Start Date End Date Taking? Authorizing Provider  HYDROcodone-acetaminophen (NORCO) 5-325 MG per tablet Take 2 tablets by mouth every 4 (four) hours as needed for pain. 03/23/12   Audelia Hives, MD    Allergies:  No Known Allergies   Social History:   reports that he has never smoked. He does not have any smokeless tobacco history on file. He reports  that he drinks alcohol. His drug history not on file.   Family History:         CAD in Father      HTN in Mother      Mother had AAA       Physical Exam:  GEN:  Pleasant 78 year old well nourished and well developed Caucasian Elderly  examined  and in no acute distress; cooperative with exam Filed Vitals:   03/23/12 2230 03/23/12 2330 03/23/12 2351 03/24/12 0206  BP: 169/90  147/80 145/86  Pulse: 114 109 116 106  Temp:    98.6 F (37 C)  TempSrc:      Resp: 28 22 20 20   SpO2: 98% 93% 92% 96%   Blood pressure 145/86, pulse 106, temperature 98.6 F (37 C), temperature source Rectal, resp. rate 20, SpO2 96.00%. PSYCH: He is alert and oriented x4; does not appear anxious does not appear depressed; affect is normal HEENT: Normocephalic and Atraumatic, Mucous membranes pink; PERRLA; EOM intact; Fundi:  Benign;  No scleral icterus, Nares: Patent, Oropharynx: Clear, Edentulous or Fair Dentition, Neck:  FROM, no cervical lymphadenopathy nor thyromegaly or carotid bruit; no JVD; Breasts:: Not examined CHEST WALL: No tenderness CHEST: Decreased Breach Sounds,   Diffuse Rhonchorus Breath Sounds ([place] Airway Noise) HEART: Regular rate and rhythm; no murmurs rubs or gallops BACK: No kyphosis or scoliosis; no CVA tenderness ABDOMEN: Positive Bowel Sounds, soft non-tender; no masses, no organomegaly. Rectal Exam: Not done EXTREMITIES: No bone or joint deformity; age-appropriate arthropathy of the hands and knees; no cyanosis,  clubbing or edema; no ulcerations. Genitalia: not examined PULSES: 2+ and symmetric SKIN: Normal hydration no rash or ulceration CNS: Cranial nerves 2-12 grossly intact no focal neurologic deficit    Labs on Admission:  Basic Metabolic Panel:  Lab 03/23/12 9604  NA 138  K 4.0  CL 97  CO2 23  GLUCOSE 126*  BUN 19  CREATININE 0.82  CALCIUM 10.2  MG --  PHOS --   Liver Function Tests:  Lab 03/23/12 2148  AST 32  ALT 21  ALKPHOS 125*  BILITOT 0.3   PROT 7.7  ALBUMIN 3.9   No results found for this basename: LIPASE:5,AMYLASE:5 in the last 168 hours No results found for this basename: AMMONIA:5 in the last 168 hours CBC:  Lab 03/23/12 2148  WBC 11.2*  NEUTROABS 6.6  HGB 14.2  HCT 41.5  MCV 92.8  PLT 328   Cardiac Enzymes: No results found for this basename: CKTOTAL:5,CKMB:5,CKMBINDEX:5,TROPONINI:5 in the last 168 hours  BNP (last 3 results) No results found for this basename: PROBNP:3 in the last 8760 hours CBG: No results found for this basename: GLUCAP:5 in the last 168 hours  Radiological Exams on Admission: Ct Head Wo Contrast  03/23/2012  *RADIOLOGY REPORT*  Clinical Data:  Status post fall down steps; question of loss of consciousness.  Vomiting.  Concern for head or cervical spine injury.  CT HEAD WITHOUT CONTRAST AND CT CERVICAL SPINE WITHOUT CONTRAST  Technique:  Multidetector CT imaging of the head and cervical spine was performed following the standard protocol without intravenous contrast.  Multiplanar CT image reconstructions of the cervical spine were also generated.  Comparison: None  CT HEAD  Findings: There is no evidence of acute infarction, mass lesion, or intra- or extra-axial hemorrhage on CT.  Evaluation is suboptimal due to motion artifact.  Prominence of the ventricles and sulci reflects mild cortical volume loss.  Scattered periventricular white matter change likely reflects small vessel ischemic microangiopathy.  Mild cerebellar atrophy is noted.  The brainstem and fourth ventricle are within normal limits.  The basal ganglia are unremarkable in appearance.  The cerebral hemispheres demonstrate grossly normal gray-white differentiation. No mass effect or midline shift is seen.  There is no evidence of fracture; visualized osseous structures are unremarkable in appearance.  The orbits are within normal limits. The paranasal sinuses and mastoid air cells are well-aerated.  No significant soft tissue  abnormalities are seen.  IMPRESSION:  1.  No evidence of traumatic intracranial injury or fracture. 2.  Mild cortical volume loss and scattered small vessel ischemic microangiopathy.  CT CERVICAL SPINE  Findings: There is no evidence of fracture or subluxation. Vertebral bodies demonstrate normal height and alignment.  The patient is status post anterior cervical spinal fusion at C3-C5, with an associated spacer element and partial resection of C4.  The patient is also status post posterior cervical spinal fusion at C3- C7, with associated decompression.  Prevertebral soft tissues are within normal limits.  The right-sided screw at C7 appears to extend just barely into the right-sided neural foramen.  The visualized portions of the thyroid gland are grossly unremarkable in appearance.  The visualized lung apices are clear. Calcification is noted at the carotid bifurcations bilaterally, extending more superiorly on the right side.  IMPRESSION:  1.  No evidence of fracture or subluxation along the cervical spine. 2.  Postoperative changes noted along the cervical spine, with fusion from C3-C7 and associated decompression. 3.  Calcification at the carotid bifurcations bilaterally, extending more superiorly  on the right side.  Carotid ultrasound would be helpful for further evaluation, when and as deemed clinically appropriate.   Original Report Authenticated By: Tonia Ghent, M.D.    Ct Cervical Spine Wo Contrast  03/23/2012  *RADIOLOGY REPORT*  Clinical Data:  Status post fall down steps; question of loss of consciousness.  Vomiting.  Concern for head or cervical spine injury.  CT HEAD WITHOUT CONTRAST AND CT CERVICAL SPINE WITHOUT CONTRAST  Technique:  Multidetector CT imaging of the head and cervical spine was performed following the standard protocol without intravenous contrast.  Multiplanar CT image reconstructions of the cervical spine were also generated.  Comparison: None  CT HEAD  Findings: There is no  evidence of acute infarction, mass lesion, or intra- or extra-axial hemorrhage on CT.  Evaluation is suboptimal due to motion artifact.  Prominence of the ventricles and sulci reflects mild cortical volume loss.  Scattered periventricular white matter change likely reflects small vessel ischemic microangiopathy.  Mild cerebellar atrophy is noted.  The brainstem and fourth ventricle are within normal limits.  The basal ganglia are unremarkable in appearance.  The cerebral hemispheres demonstrate grossly normal gray-white differentiation. No mass effect or midline shift is seen.  There is no evidence of fracture; visualized osseous structures are unremarkable in appearance.  The orbits are within normal limits. The paranasal sinuses and mastoid air cells are well-aerated.  No significant soft tissue abnormalities are seen.  IMPRESSION:  1.  No evidence of traumatic intracranial injury or fracture. 2.  Mild cortical volume loss and scattered small vessel ischemic microangiopathy.  CT CERVICAL SPINE  Findings: There is no evidence of fracture or subluxation. Vertebral bodies demonstrate normal height and alignment.  The patient is status post anterior cervical spinal fusion at C3-C5, with an associated spacer element and partial resection of C4.  The patient is also status post posterior cervical spinal fusion at C3- C7, with associated decompression.  Prevertebral soft tissues are within normal limits.  The right-sided screw at C7 appears to extend just barely into the right-sided neural foramen.  The visualized portions of the thyroid gland are grossly unremarkable in appearance.  The visualized lung apices are clear. Calcification is noted at the carotid bifurcations bilaterally, extending more superiorly on the right side.  IMPRESSION:  1.  No evidence of fracture or subluxation along the cervical spine. 2.  Postoperative changes noted along the cervical spine, with fusion from C3-C7 and associated decompression. 3.   Calcification at the carotid bifurcations bilaterally, extending more superiorly on the right side.  Carotid ultrasound would be helpful for further evaluation, when and as deemed clinically appropriate.   Original Report Authenticated By: Tonia Ghent, M.D.    Dg Pelvis Portable  03/23/2012  *RADIOLOGY REPORT*  Clinical Data: Larey Seat down stairs, shortness of breath, asthma, COPD, hypertension, and pacemaker  PORTABLE PELVIS  Comparison: Portable exam 2214 hours without priors for comparison  Findings: Diffuse osseous demineralization. Symmetric hip and SI joints. No acute fracture, dislocation, or bone destruction. Corticated deformity at the lateral margin of the right iliac wing question sequela of prior surgery. Scattered vascular calcifications.  IMPRESSION: Osseous demineralization. No definite acute osseous abnormalities.   Original Report Authenticated By: Ulyses Southward, M.D.    Dg Chest Portable 1 View  03/23/2012  *RADIOLOGY REPORT*  Clinical Data: Status post fall down stairs; shortness of breath. History of asthma.  PORTABLE CHEST - 1 VIEW  Comparison: None.  Findings: The lungs are well-aerated and clear.  There is no  evidence of focal opacification, pleural effusion or pneumothorax.  The cardiomediastinal silhouette is borderline normal in size; a pacemaker is noted overlying the left chest wall, with leads ending overlying the right atrium and right ventricle.  There appear to be mildly displaced fractures of the left lateral seventh and eighth ribs.  IMPRESSION:  1.  Apparent mildly displaced fractures of the left lateral seventh and eighth ribs. 2.  No acute cardiopulmonary process seen.   Original Report Authenticated By: Tonia Ghent, M.D.     EKG: Independently reviewed.   Assessment: Active Problems:  Multiple rib fractures  Fall down steps  Altered mental status  COPD (chronic obstructive pulmonary disease)  Hypertension  Spinal stenosis of lumbar region with radiculopathy   URI    Plan:   Pain Control Incentive Spirometry PT and OT evaluations Azithromycin and Mucinex Nebs, O2 PRN Reconcile Home Medications DVT Prophylaxis   Code Status:  FULL CODE Family Communication:  Family at Bedside Disposition Plan:  TBA  Time spent:  41 Minutes  Ron Parker [facility] Pager [phone]  If 7PM-7AM, please contact night-coverage [email or web address] Password [email or web address] 03/24/2012, 2:50 AM

## 2012-03-24 NOTE — Progress Notes (Signed)
Patient seen and examined   #1 COPD exacerbation continue azithromycin started on Xopenex nebulizer treatments, if continues to wheeze will start low-dose Solu-Medrol #2 dementia/fall will need physical occupational therapy evaluation. No evidence of intracranial injury. Does have a history of severe cervical spinal stenosis which could be contribution to the falls #3 rib fracture continue incentive spirometry/oxygen pain control

## 2012-03-24 NOTE — Evaluation (Signed)
Physical Therapy Evaluation Patient Details Name: Jerry Kerr MRN: [id number] DOB: Sep 20, 1933 Today's Date: 03/24/2012 Time: 3086-5784 PT Time Calculation (min): 12 min  PT Assessment / Plan / Recommendation Clinical Impression  Pt admitted s/p fall down stairs with multiple rib fxs. Pt with history of back pain and plan for surgery in the near future in [place]. Pt currently requiring assistance secondary to pain only, will have 24/7 family support at home. Plan to d/c this afternoon for Christmas dinner. Discussed use of RW for increased support as well as family prior to surgery for safety. No further acute PT needs    PT Assessment  Patent does not need any further PT services    Follow Up Recommendations  No PT follow up    Does the patient have the potential to tolerate intense rehabilitation      Barriers to Discharge        Equipment Recommendations  Rolling walker with 5" wheels    Recommendations for Other Services     Frequency      Precautions / Restrictions Precautions Precautions: Fall Restrictions Weight Bearing Restrictions: No   Pertinent Vitals/Pain Pain in back. RN aware.       Mobility  Bed Mobility Bed Mobility: Not assessed Transfers Transfers: Sit to Stand;Stand to Sit Sit to Stand: 5: Supervision;With upper extremity assist;From chair/3-in-1 Stand to Sit: 5: Supervision;With upper extremity assist;To chair/3-in-1 Details for Transfer Assistance: Supervision for safety Ambulation/Gait Ambulation/Gait Assistance: 4: Min guard Ambulation Distance (Feet): 100 Feet Assistive device: None Ambulation/Gait Assistance Details: Minguard as pt with antalgic gait(back) and holds onto things for support. Discussed RW for discharge at home.  Gait Pattern: Antalgic;Trunk flexed Gait velocity: slow Stairs: Yes Stairs Assistance: 5: Supervision Stairs Assistance Details (indicate cue type and reason): Supervision for safety. Pt with multiple rest breaks  secondary to increased pain Stair Management Technique: One rail Right;Forwards;Step to pattern Number of Stairs: 10     Shoulder Instructions     Exercises     PT Diagnosis:    PT Problem List:   PT Treatment Interventions:     PT Goals    Visit Information  Last PT Received On: 03/24/12 Assistance Needed: +1    Subjective Data  Patient Stated Goal: to go home ASAP for christmas lunch/dinner   Prior Functioning  Home Living Lives With: Spouse Available Help at Discharge: Family;Available 24 hours/day Type of Home: House Home Access: Stairs to enter [employer] of Steps: 10 Entrance Stairs-Rails: Can reach both Home Layout: Two level Alternate Level Stairs-Number of Steps: 13 Alternate Level Stairs-Rails: Right;Left;Can reach both Bathroom Shower/Tub: Walk-in shower;Door [employer] Toilet: Standard Bathroom Accessibility: Yes How Accessible: Accessible via walker Home Adaptive Equipment: Walker - rolling Prior Function Level of Independence: Independent Able to Take Stairs?: Yes Driving: Yes Vocation: Retired Musician: No difficulties Dominant Hand: Right    Cognition  Overall Cognitive Status: Appears within functional limits for tasks assessed/performed Arousal/Alertness: Awake/alert Orientation Level: Appears intact for tasks assessed Behavior During Session: [facility] for tasks performed    Extremity/Trunk Assessment Right Lower Extremity Assessment RLE ROM/Strength/Tone: Within functional levels RLE Sensation: WFL - Light Touch Left Lower Extremity Assessment LLE ROM/Strength/Tone: Within functional levels LLE Sensation: WFL - Light Touch Trunk Assessment Trunk Assessment: Kyphotic   Balance    End of Session PT - End of Session Equipment Utilized During Treatment: Gait belt Activity Tolerance: Patient limited by pain Patient left: in chair;with call bell/phone within reach Nurse Communication: Mobility status   GP  Functional Assessment Tool Used: clinical judgement Functional Limitation: Mobility: Walking and moving around Mobility: Walking and Moving Around Current Status [phone]): At least 1 percent but less than 20 percent impaired, limited or restricted Mobility: Walking and Moving Around Goal Status [phone]): At least 1 percent but less than 20 percent impaired, limited or restricted Mobility: Walking and Moving Around Discharge Status [phone]): At least 1 percent but less than 20 percent impaired, limited or restricted   Milana Kidney 03/24/2012, 12:06 PM  03/24/2012 Milana Kidney DPT PAGER: [phone] OFFICE: [phone]

## 2012-03-25 NOTE — Progress Notes (Signed)
Utilization review completed. Kaleeyah Cuffie, RN, BSN. 

## 2013-03-08 DIAGNOSIS — G309 Alzheimer's disease, unspecified: Secondary | ICD-10-CM | POA: Insufficient documentation

## 2013-03-08 DIAGNOSIS — F0281 Dementia in other diseases classified elsewhere with behavioral disturbance: Secondary | ICD-10-CM | POA: Insufficient documentation

## 2013-03-08 DIAGNOSIS — Z950 Presence of cardiac pacemaker: Secondary | ICD-10-CM | POA: Insufficient documentation

## 2013-03-08 DIAGNOSIS — Z8371 Family history of colonic polyps: Secondary | ICD-10-CM | POA: Insufficient documentation

## 2013-03-08 DIAGNOSIS — I10 Essential (primary) hypertension: Secondary | ICD-10-CM | POA: Insufficient documentation

## 2013-03-08 DIAGNOSIS — J449 Chronic obstructive pulmonary disease, unspecified: Secondary | ICD-10-CM | POA: Insufficient documentation

## 2013-03-08 DIAGNOSIS — F1010 Alcohol abuse, uncomplicated: Secondary | ICD-10-CM | POA: Insufficient documentation

## 2013-03-08 DIAGNOSIS — M8580 Other specified disorders of bone density and structure, unspecified site: Secondary | ICD-10-CM | POA: Insufficient documentation

## 2013-03-08 DIAGNOSIS — D649 Anemia, unspecified: Secondary | ICD-10-CM | POA: Insufficient documentation

## 2013-03-08 DIAGNOSIS — Z8546 Personal history of malignant neoplasm of prostate: Secondary | ICD-10-CM | POA: Insufficient documentation

## 2013-03-08 DIAGNOSIS — E785 Hyperlipidemia, unspecified: Secondary | ICD-10-CM | POA: Insufficient documentation

## 2013-03-08 DIAGNOSIS — R945 Abnormal results of liver function studies: Secondary | ICD-10-CM | POA: Insufficient documentation

## 2013-03-08 DIAGNOSIS — I4891 Unspecified atrial fibrillation: Secondary | ICD-10-CM | POA: Insufficient documentation

## 2013-06-08 ENCOUNTER — Telehealth: Payer: Self-pay | Admitting: Internal Medicine

## 2013-06-08 NOTE — Telephone Encounter (Signed)
Patient is scheduled for 9;15 on 06/20/13

## 2013-06-15 ENCOUNTER — Encounter: Payer: Self-pay | Admitting: Internal Medicine

## 2013-06-20 ENCOUNTER — Encounter: Payer: Self-pay | Admitting: Internal Medicine

## 2013-06-20 ENCOUNTER — Ambulatory Visit (INDEPENDENT_AMBULATORY_CARE_PROVIDER_SITE_OTHER): Payer: Medicare Other | Admitting: Internal Medicine

## 2013-06-20 VITALS — BP 134/88 | HR 64 | Ht 69.0 in | Wt 152.0 lb

## 2013-06-20 DIAGNOSIS — R1314 Dysphagia, pharyngoesophageal phase: Secondary | ICD-10-CM

## 2013-06-20 DIAGNOSIS — Z86010 Personal history of colonic polyps: Secondary | ICD-10-CM

## 2013-06-20 DIAGNOSIS — Q391 Atresia of esophagus with tracheo-esophageal fistula: Secondary | ICD-10-CM

## 2013-06-20 DIAGNOSIS — Q393 Congenital stenosis and stricture of esophagus: Secondary | ICD-10-CM

## 2013-06-20 DIAGNOSIS — K222 Esophageal obstruction: Secondary | ICD-10-CM | POA: Insufficient documentation

## 2013-06-20 DIAGNOSIS — K219 Gastro-esophageal reflux disease without esophagitis: Secondary | ICD-10-CM

## 2013-06-20 DIAGNOSIS — R1310 Dysphagia, unspecified: Secondary | ICD-10-CM | POA: Insufficient documentation

## 2013-06-20 DIAGNOSIS — R1319 Other dysphagia: Secondary | ICD-10-CM

## 2013-06-20 DIAGNOSIS — Z8719 Personal history of other diseases of the digestive system: Secondary | ICD-10-CM

## 2013-06-20 NOTE — Progress Notes (Signed)
Patient ID: Jerry Kerr, male   DOB: Nov 06, 1933, 79 y.o.   MRN: [id number] HPI: Mr. Seehafer is a 80 yo male with PMH of Alzheimer's dementia, alcohol abuse, COPD, hypertension, afib s/p pacemaker placement, GERD with possible eosinophilic esophagitis and Barrett's, diverticulosis and colon polyps who is seen to evaluate dysphagia. He is here today with his daughter, [name], who made the appointment.  Prior to the appointment his daughter reported that he had been having trouble swallowing pills and occasionally swallowing solid food. She wondered if it is time for a repeat dilation. Today the patient reports he is doing fine with his swallowing he doesn't feel he needs a repeat dilation. He reports he does have trouble with large pills but this has been an ongoing problem for many years which did not respond to dilation in the past. He reports he is eating fine and denies solid or liquid food dysphagia today. He denies odynophagia. No nausea or vomiting. Appetite is good weight is stable. He reports normal bowel habits without blood in his stool or melena. He has recently moved to [facility] to be closer to family and has entered an assisted living type setting. There are times when he is very angry about this move.  Past Medical History  Diagnosis Date  . Asthma   . Dementia   . Back pain   . COPD (chronic obstructive pulmonary disease)   . Hypertension   . Pacemaker     Past Surgical History  Procedure Laterality Date  . Neck surgery      Current Outpatient Prescriptions  Medication Sig Dispense Refill  . acetaminophen (TYLENOL) 500 MG tablet Take 500 mg by mouth every 6 (six) hours as needed.      Marland Kitchen albuterol (PROAIR HFA) 108 (90 BASE) MCG/ACT inhaler Inhale 2 puffs into the lungs every 6 (six) hours as needed for wheezing or shortness of breath.      Marland Kitchen albuterol-ipratropium (COMBIVENT) 18-103 MCG/ACT inhaler Inhale 2 puffs into the lungs every 6 (six) hours as needed for wheezing.  1  Inhaler  0  . alum & mag hydroxide-simeth (MAALOX ADVANCED) 200-200-20 MG/5ML suspension Take 30 mLs by mouth every 6 (six) hours as needed for indigestion or heartburn.      Marland Kitchen aspirin 81 MG tablet Take 81 mg by mouth daily.      Marland Kitchen azithromycin (ZITHROMAX) 500 MG tablet Take 1 tablet (500 mg total) by mouth daily.  5 tablet  0  . cyclobenzaprine (FLEXERIL) 5 MG tablet Take 5 mg by mouth 3 (three) times daily as needed for muscle spasms.      . divalproex (DEPAKOTE) 125 MG DR tablet Take 125 mg by mouth 3 (three) times daily.      . haloperidol (HALDOL) 2 MG tablet Take 2 mg by mouth 2 (two) times daily.      Marland Kitchen HYDROcodone-acetaminophen (NORCO) 5-325 MG per tablet Take 2 tablets by mouth every 4 (four) hours as needed for pain.  20 tablet  0  . Lactobacillus Rhamnosus, GG, (CULTURELLE KIDS) PACK Take 1 packet by mouth daily.      Marland Kitchen lisinopril (PRINIVIL,ZESTRIL) 5 MG tablet Take 5 mg by mouth daily.      Marland Kitchen loperamide (IMODIUM) 2 MG capsule Take 2 mg by mouth as needed for diarrhea or loose stools.      Marland Kitchen LORazepam (ATIVAN) 0.5 MG tablet Take 0.5 mg by mouth every 8 (eight) hours.      . memantine (NAMENDA) 10 MG tablet  Take 10 mg by mouth 2 (two) times daily.      . metoprolol (LOPRESSOR) 50 MG tablet Take 50 mg by mouth 2 (two) times daily.      Marland Kitchen omeprazole (PRILOSEC) 20 MG capsule Take 20 mg by mouth daily.      . potassium chloride 20 MEQ/15ML (10%) solution Take 20 mEq by mouth daily.      . QUEtiapine (SEROQUEL) 50 MG tablet Take 50 mg by mouth at bedtime.      . sertraline (ZOLOFT) 50 MG tablet Take 50 mg by mouth daily.      Marland Kitchen tiotropium (SPIRIVA) 18 MCG inhalation capsule Place 18 mcg into inhaler and inhale daily.      . traMADol (ULTRAM) 50 MG tablet Take by mouth every 6 (six) hours as needed.      . traZODone (DESYREL) 50 MG tablet Take 50 mg by mouth at bedtime.       No current facility-administered medications for this visit.    No Known Allergies  Family History  Problem  Relation Age of Onset  . CAD Father   . Hypertension Mother   . AAA (abdominal aortic aneurysm) Mother     History  Substance Use Topics  . 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 Smokeless tobacco: Never Used  . Alcohol Use: No    ROS: As per history of present illness, otherwise negative  BP 134/88  Pulse 64  Ht 5\' 9"  (1.753 m)  Wt 152 lb (68.947 kg)  BMI 22.44 kg/m2 Constitutional: Well-developed and well-nourished. No distress. HEENT: Normocephalic and atraumatic. Oropharynx is clear and moist. No oropharyngeal exudate. Conjunctivae are normal.  No scleral icterus. Neck: Neck supple. Trachea midline. Cardiovascular: Normal rate, regular rhythm and intact distal pulses. Pacemaker in place, nontender Pulmonary/chest: Effort normal and breath sounds normal. No wheezing, rales or rhonchi. Abdominal: Soft, nontender, nondistended. Bowel sounds active throughout.  Extremities: no clubbing, cyanosis, or edema Neurological: Alert and oriented to person place and time. Psychiatric: Normal mood and affect. Behavior is normal.  RELEVANT LABS AND IMAGING: [facility] --EGD and colonoscopy 05/17/2010, Dr. Lyda Kalata, MD --EGD moderate narrowing at the end of the esophagus which was dilated to 15 mm with balloon. Changes suspicious for eosinophilic esophagitis.  2-3 cm sliding hernia. Severe pan gastritis biopsies taken. Superficial ulcerations throughout the stomach with adherent coffee-ground material. Duodenitis in the bulb otherwise unremarkable duodenum. --Colonoscopy radiation proctitis treated with APC, 2+ internal hemorrhoids, severe pan diverticulosis, moderate polyp in the mid transverse colon removed with cold forcep = tubular adenoma without high-grade dysplasia  Additional records not available but requested today. The patient's daughter feels that endoscopy has been performed since 2012  ASSESSMENT/PLAN: 80 yo male with PMH of  Alzheimer's dementia, alcohol abuse, COPD, hypertension, afib s/p pacemaker placement, GERD with possible eosinophilic esophagitis and Barrett's, diverticulosis and colon polyps who is seen to evaluate dysphagia.  1.  Dysphagia/hx of esophageal ring/? EoE/hiatal hernia/? Barrett's -- at present the patient does not feel he needs an endoscopy nor does he want to have an endoscopy performed. He reports that he is eating fine and will let me know as soon as he has trouble. We discussed barium swallow a tablet but he does not want to pursue this test. There may be a disconnect between what the patient is telling me today and what symptoms he is actually having. I tried in several ways to discuss endoscopy but he is adamant he  does not wish to proceed at this time. I have advised that he remain on omeprazole daily given his history of reflux, esophageal stricture, and the possibility of eosinophilic esophagitis. There was no mention of Barrett's and 2012, and therefore my suspicion for Barrett's esophagus is low. I will review any additional records that we receive. Regarding the possibility of eosinophilic esophagitis, he does not recall ever being treated with topical steroid such as fluticasone or budesonide.  2.  Hx of colon polyp -- small adenoma in 2012, await additional records. Surveillance would be due in 5621, though I'm uncertain if you won't proceed with this test. This can be discussed with his primary care provider and with me in the future.  Return as needed

## 2013-06-20 NOTE — Patient Instructions (Signed)
Continue omeprazole daily.  Dr. Hilarie Fredrickson recommends a endoscopy with Dilation, please call our office when you are ready to schedule this procedure.   Follow up with Dr. Hilarie Fredrickson in office as needed

## 2013-06-21 ENCOUNTER — Telehealth: Payer: Self-pay | Admitting: Internal Medicine

## 2013-06-21 NOTE — Telephone Encounter (Signed)
Received 28 pages from [facility], sent to Dr. Norman Herrlich on 06/21/13/ss.

## 2013-07-20 ENCOUNTER — Other Ambulatory Visit: Payer: Self-pay | Admitting: Gastroenterology

## 2013-07-20 ENCOUNTER — Telehealth: Payer: Self-pay | Admitting: Gastroenterology

## 2013-07-20 DIAGNOSIS — K2270 Barrett's esophagus without dysplasia: Secondary | ICD-10-CM

## 2013-07-20 DIAGNOSIS — R1310 Dysphagia, unspecified: Secondary | ICD-10-CM

## 2013-07-20 NOTE — Telephone Encounter (Signed)
Received records from [facility]. If daughter calls please let her know she will need to be added to pt's list of people we can discuss info with.

## 2013-08-23 ENCOUNTER — Encounter: Payer: Medicare Other | Admitting: Internal Medicine

## 2013-08-29 ENCOUNTER — Ambulatory Visit (INDEPENDENT_AMBULATORY_CARE_PROVIDER_SITE_OTHER)
Admission: RE | Admit: 2013-08-29 | Discharge: 2013-08-29 | Disposition: A | Discharge status: Home / Self Care | Payer: Medicare Other | Source: Ambulatory Visit | Attending: Internal Medicine | Admitting: Internal Medicine

## 2013-08-29 ENCOUNTER — Telehealth: Payer: Self-pay | Admitting: Internal Medicine

## 2013-08-29 ENCOUNTER — Encounter: Payer: Self-pay | Admitting: Internal Medicine

## 2013-08-29 ENCOUNTER — Ambulatory Visit (AMBULATORY_SURGERY_CENTER): Payer: Medicare Other | Admitting: Internal Medicine

## 2013-08-29 VITALS — BP 191/95 | HR 60 | Temp 97.4°F | Resp 15 | Ht 69.0 in | Wt 152.0 lb

## 2013-08-29 DIAGNOSIS — Q391 Atresia of esophagus with tracheo-esophageal fistula: Secondary | ICD-10-CM

## 2013-08-29 DIAGNOSIS — R1310 Dysphagia, unspecified: Secondary | ICD-10-CM

## 2013-08-29 DIAGNOSIS — K222 Esophageal obstruction: Secondary | ICD-10-CM

## 2013-08-29 DIAGNOSIS — K219 Gastro-esophageal reflux disease without esophagitis: Secondary | ICD-10-CM

## 2013-08-29 DIAGNOSIS — K2270 Barrett's esophagus without dysplasia: Secondary | ICD-10-CM

## 2013-08-29 DIAGNOSIS — Q393 Congenital stenosis and stricture of esophagus: Secondary | ICD-10-CM

## 2013-08-29 DIAGNOSIS — R1314 Dysphagia, pharyngoesophageal phase: Secondary | ICD-10-CM

## 2013-08-29 DIAGNOSIS — R1319 Other dysphagia: Secondary | ICD-10-CM

## 2013-08-29 MED ORDER — SODIUM CHLORIDE 0.9 % IV SOLN: 500.0000 mL | INTRAVENOUS | Status: DC

## 2013-08-29 NOTE — Telephone Encounter (Signed)
I spoke with Crystal from the patient's nursing home, and she had a lot of medication questions about the patient.   I phoned Dr. Hilarie Fredrickson, and he said that he would speak to her.  Dr. Aletha Halim call me if he needs the nursing home med list.

## 2013-08-29 NOTE — Progress Notes (Addendum)
Patient's daughter asked for a washcloth to clean the patient's ear.   The Left ear is scaley and scabby; she cleaned it, and now it is a little bloody do to the fact that it was washed.  The family will take him to see a Dermatologist per the wife of patient.  Patient does not speak unless spoken to directly.   The patient's daughter states that she "knows Dr. Hilarie Fredrickson".  Dr. Hilarie Fredrickson has ordered a chest X-ray for the patient on the way out of the building.

## 2013-08-29 NOTE — Patient Instructions (Signed)
YOU HAD AN ENDOSCOPIC PROCEDURE TODAY AT [facility]: Refer to the procedure report that was given to you for any specific questions about what was found during the examination.  If the procedure report does not answer your questions, please call your gastroenterologist to clarify.  If you requested that your care partner not be given the details of your procedure findings, then the procedure report has been included in a sealed envelope for you to review at your convenience later.  YOU SHOULD EXPECT: Some feelings of bloating in the abdomen. Passage of more gas than usual.  Walking can help get rid of the air that was put into your GI tract during the procedure and reduce the bloating. If you had a lower endoscopy (such as a colonoscopy or flexible sigmoidoscopy) you may notice spotting of blood in your stool or on the toilet paper. If you underwent a bowel prep for your procedure, then you may not have a normal bowel movement for a few days.  DIET: Stay on a clear liquid diet for lunch, and he may go to a soft diet for the rest of today.  Continue the PPI 1/2 hour before breakfast every day.  Drink plenty of fluids but you should avoid alcoholic beverages for 24 hours.  ACTIVITY: Your care partner should take you home directly after the procedure.  You should plan to take it easy, moving slowly for the rest of the day.  You can resume normal activity the day after the procedure however you should NOT DRIVE or use heavy machinery for 24 hours (because of the sedation medicines used during the test).    SYMPTOMS TO REPORT IMMEDIATELY: A gastroenterologist can be reached at any hour.  During normal business hours, 8:30 AM to 5:00 PM Monday through Friday, call [phone].  After hours and on weekends, please call the GI answering service at [phone] who will take a message and have the physician on call contact you.   Following upper endoscopy (EGD)  Vomiting of blood or coffee  ground material  New chest pain or pain under the shoulder blades  Painful or persistently difficult swallowing  New shortness of breath  Fever of 100F or higher  Black, tarry-looking stools  FOLLOW UP: If any biopsies were taken you will be contacted by phone or by letter within the next 1-3 weeks.  Call your gastroenterologist if you have not heard about the biopsies in 3 weeks.  Our staff will call the home number listed on your records the next business day following your procedure to check on you and address any questions or concerns that you may have at that time regarding the information given to you following your procedure. This is a courtesy call and so if there is no answer at the home number and we have not heard from you through the emergency physician on call, we will assume that you have returned to your regular daily activities without incident.  SIGNATURES/CONFIDENTIALITY: You and/or your care partner have signed paperwork which will be entered into your electronic medical record.  These signatures attest to the fact that that the information above on your After Visit Summary has been reviewed and is understood.  Full responsibility of the confidentiality of this discharge information lies with you and/or your care-partner.

## 2013-08-29 NOTE — Op Note (Signed)
[place]  [address] [place], [id number]   ENDOSCOPY PROCEDURE REPORT  PATIENT: Jerry Kerr, Jerry Kerr  MR#: [id number] BIRTHDATE: [birth year] , 77  yrs. old GENDER: Male ENDOSCOPIST: Jerene Bears, MD PROCEDURE DATE:  08/29/2013 PROCEDURE:  EGD w/ biopsy ASA CLASS:     Class III INDICATIONS:  esophageal dysphagia, history of distal esophageal stricture with multiple previous dilations, last dilation January 2015 with balloon to 13.5 mm (performed in [place]), history of Barrett's esophagus, history of GERD. MEDICATIONS: MAC sedation, administered by CRNA and Propofol (Diprivan) 90 mg IV TOPICAL ANESTHETIC: none  DESCRIPTION OF PROCEDURE: After the risks benefits and alternatives of the procedure were thoroughly explained, informed consent was obtained.  The LB [id number] endoscope was introduced through the mouth and advanced to the second portion of the duodenum. Without limitations.  The instrument was slowly withdrawn as the mucosa was fully examined.   ESOPHAGUS: A series of distal esophageal rings was found in the lower third of the esophagus.  The esophageal mucosa had a ringed/feline type appearance raising the suspicion of eosinophilic esophagitis.  There was evidence of suspected Barrett's esophagus in the lower third of the esophagus. There are multiple islands of salmon-colored mucosa in one tongue extending from the top of the gastric folds for approximately 1 cm.  Multiple biopsies were performed using cold forceps.  Sample sent for histology.   A 3 cm hiatal hernia was noted.   On withdrawal of the endoscope a superficial mucosal tear was seen in the distal esophagus at the place of esophageal narrowing/rings.  For this reason, further dilation was not performed today.  STOMACH: The mucosa of the stomach appeared normal.  DUODENUM: The duodenal mucosa showed no abnormalities in the bulb and second portion of the duodenum.  Retroflexed views  revealed a hiatal hernia.     The scope was then withdrawn from the patient and the procedure completed.  COMPLICATIONS: There were no complications.  ENDOSCOPIC IMPRESSION: 1.   Feline appearance to the esophageal mucosa, changes of reflux versus eosinophilic esophagitis 2.   Distal esophageal mucosal rings were found in the lower third of the esophagus, mucosal tear with passage of endoscope, thus no further dilation performed 2.   There was evidence of suspected Barrett's esophagus; multiple biopsies 3.   3 cm hiatal hernia 4.   The mucosa of the stomach appeared normal 5.   The duodenal mucosa showed no abnormalities in the bulb and second portion of the duodenum  RECOMMENDATIONS: 1.  Await biopsy results 2.  Continue taking your PPI (antiacid medicine) once daily.  It is best to be taken 20-30 minutes prior to breakfast meal. 3.  Liquid diet for lunch and soft solids thereafter 4.  Repeat dilation can be performed as symptoms necessitate   eSigned:  Jerene Bears, MD 08/29/2013 10:44 AM   CC:The Patient  PATIENT NAME:  [name], [name] MR#: [id number]

## 2013-08-29 NOTE — Progress Notes (Signed)
Pt stable to RR Mental status as preop

## 2013-08-29 NOTE — Progress Notes (Signed)
Called to room to assist during endoscopic procedure.  Patient ID and intended procedure confirmed with present staff. Received instructions for my participation in the procedure from the performing physician.  

## 2013-08-30 ENCOUNTER — Telehealth: Payer: Self-pay | Admitting: *Deleted

## 2013-08-30 NOTE — Telephone Encounter (Signed)
  Follow up Call-  Call back number 08/29/2013  Post procedure Call Back phone  # [phone]  ROOM 211 ASK FOR NURSE ASK TO SPEAK TO LEAD  Permission to leave phone message Yes     Patient questions:  Do you have a fever, pain , or abdominal swelling? no Pain Score  0 *  Have you tolerated food without any problems? yes  Have you been able to return to your normal activities? yes  Do you have any questions about your discharge instructions: Diet   no Medications  no Follow up visit  no  Do you have questions or concerns about your Care? no  Actions: * If pain score is 4 or above: No action needed, pain <4.

## 2013-08-30 NOTE — Telephone Encounter (Signed)
  Follow up Call-  Call back number 08/29/2013  Post procedure Call Back phone  # [phone]  ROOM 211 ASK FOR NURSE ASK TO SPEAK TO LEAD  Permission to leave phone message Yes     Patient questions:  The staff member where the patient lives told me to call back later to speak to the patient's long term care nurse.  She said to try to call back in 10 minutes.  Will try to call back after next few patients.

## 2013-09-01 ENCOUNTER — Encounter: Payer: Self-pay | Admitting: Internal Medicine

## 2013-09-28 ENCOUNTER — Other Ambulatory Visit: Payer: Self-pay | Admitting: Dermatology

## 2013-11-07 ENCOUNTER — Encounter (HOSPITAL_COMMUNITY): Payer: Self-pay | Admitting: Emergency Medicine

## 2013-11-07 ENCOUNTER — Emergency Department (HOSPITAL_COMMUNITY): Payer: Medicare Other

## 2013-11-07 ENCOUNTER — Observation Stay (HOSPITAL_COMMUNITY)
Admission: EM | Admit: 2013-11-07 | Discharge: 2013-11-09 | Disposition: A | Discharge status: Home / Self Care | Payer: Medicare Other | Attending: Family Medicine | Admitting: Family Medicine

## 2013-11-07 DIAGNOSIS — R1314 Dysphagia, pharyngoesophageal phase: Secondary | ICD-10-CM | POA: Diagnosis not present

## 2013-11-07 DIAGNOSIS — J4489 Other specified chronic obstructive pulmonary disease: Secondary | ICD-10-CM | POA: Insufficient documentation

## 2013-11-07 DIAGNOSIS — R0602 Shortness of breath: Secondary | ICD-10-CM | POA: Diagnosis present

## 2013-11-07 DIAGNOSIS — M48061 Spinal stenosis, lumbar region without neurogenic claudication: Secondary | ICD-10-CM

## 2013-11-07 DIAGNOSIS — Z87891 Personal history of nicotine dependence: Secondary | ICD-10-CM | POA: Insufficient documentation

## 2013-11-07 DIAGNOSIS — W19XXXA Unspecified fall, initial encounter: Secondary | ICD-10-CM | POA: Insufficient documentation

## 2013-11-07 DIAGNOSIS — K222 Esophageal obstruction: Secondary | ICD-10-CM

## 2013-11-07 DIAGNOSIS — Z79899 Other long term (current) drug therapy: Secondary | ICD-10-CM | POA: Diagnosis not present

## 2013-11-07 DIAGNOSIS — R55 Syncope and collapse: Principal | ICD-10-CM

## 2013-11-07 DIAGNOSIS — I10 Essential (primary) hypertension: Secondary | ICD-10-CM

## 2013-11-07 DIAGNOSIS — R1319 Other dysphagia: Secondary | ICD-10-CM

## 2013-11-07 DIAGNOSIS — Z8546 Personal history of malignant neoplasm of prostate: Secondary | ICD-10-CM | POA: Diagnosis not present

## 2013-11-07 DIAGNOSIS — K219 Gastro-esophageal reflux disease without esophagitis: Secondary | ICD-10-CM | POA: Diagnosis not present

## 2013-11-07 DIAGNOSIS — F411 Generalized anxiety disorder: Secondary | ICD-10-CM | POA: Diagnosis not present

## 2013-11-07 DIAGNOSIS — F3289 Other specified depressive episodes: Secondary | ICD-10-CM | POA: Diagnosis not present

## 2013-11-07 DIAGNOSIS — Z860101 Personal history of adenomatous and serrated colon polyps: Secondary | ICD-10-CM

## 2013-11-07 DIAGNOSIS — Z7982 Long term (current) use of aspirin: Secondary | ICD-10-CM | POA: Diagnosis not present

## 2013-11-07 DIAGNOSIS — Z86010 Personal history of colonic polyps: Secondary | ICD-10-CM

## 2013-11-07 DIAGNOSIS — J449 Chronic obstructive pulmonary disease, unspecified: Secondary | ICD-10-CM

## 2013-11-07 DIAGNOSIS — F0390 Unspecified dementia without behavioral disturbance: Secondary | ICD-10-CM

## 2013-11-07 DIAGNOSIS — F329 Major depressive disorder, single episode, unspecified: Secondary | ICD-10-CM | POA: Diagnosis not present

## 2013-11-07 DIAGNOSIS — M5416 Radiculopathy, lumbar region: Secondary | ICD-10-CM

## 2013-11-07 DIAGNOSIS — IMO0002 Reserved for concepts with insufficient information to code with codable children: Secondary | ICD-10-CM | POA: Diagnosis not present

## 2013-11-07 DIAGNOSIS — Z950 Presence of cardiac pacemaker: Secondary | ICD-10-CM | POA: Diagnosis not present

## 2013-11-07 DIAGNOSIS — R1310 Dysphagia, unspecified: Secondary | ICD-10-CM

## 2013-11-07 LAB — COMPREHENSIVE METABOLIC PANEL
ALBUMIN: 3.4 g/dL — AB (ref 3.5–5.2)
ALT: 11 U/L (ref 0–53)
AST: 18 U/L (ref 0–37)
Alkaline Phosphatase: 103 U/L (ref 39–117)
Anion gap: 15 (ref 5–15)
BILIRUBIN TOTAL: 0.4 mg/dL (ref 0.3–1.2)
BUN: 19 mg/dL (ref 6–23)
CO2: 24 mEq/L (ref 19–32)
CREATININE: 0.89 mg/dL (ref 0.50–1.35)
Calcium: 10.1 mg/dL (ref 8.4–10.5)
Chloride: 98 mEq/L (ref 96–112)
GFR calc Af Amer: >90 mL/min (ref >90)
GFR calc non Af Amer: 80 mL/min — ABNORMAL LOW (ref >90)
Glucose, Bld: 90 mg/dL (ref 70–99)
Potassium: 4.4 mEq/L (ref 3.7–5.3)
SODIUM: 137 meq/L (ref 137–147)
Total Protein: 7 g/dL (ref 6.0–8.3)

## 2013-11-07 LAB — I-STAT CHEM 8, ED
BUN: 18 mg/dL (ref 6–23)
Calcium, Ion: 1.19 mmol/L (ref 1.13–1.30)
Chloride: 101 mEq/L (ref 96–112)
Creatinine, Ser: 0.9 mg/dL (ref 0.50–1.35)
Glucose, Bld: 89 mg/dL (ref 70–99)
HEMATOCRIT: 40 % (ref 39.0–52.0)
HEMOGLOBIN: 13.6 g/dL (ref 13.0–17.0)
Potassium: 4.3 mEq/L (ref 3.7–5.3)
SODIUM: 138 meq/L (ref 137–147)
TCO2: 23 mmol/L (ref 0–100)

## 2013-11-07 LAB — URINALYSIS, ROUTINE W REFLEX MICROSCOPIC
Bilirubin Urine: NEGATIVE
Glucose, UA: NEGATIVE mg/dL
Ketones, ur: NEGATIVE mg/dL
LEUKOCYTES UA: NEGATIVE
NITRITE: NEGATIVE
Protein, ur: NEGATIVE mg/dL
Specific Gravity, Urine: 1.015 (ref 1.005–1.030)
Urobilinogen, UA: 0.2 mg/dL (ref 0.0–1.0)
pH: 6 (ref 5.0–8.0)

## 2013-11-07 LAB — I-STAT TROPONIN, ED: Troponin i, poc: 0 ng/mL (ref 0.00–0.08)

## 2013-11-07 LAB — CBC
HCT: 37 % — ABNORMAL LOW (ref 39.0–52.0)
Hemoglobin: 12.6 g/dL — ABNORMAL LOW (ref 13.0–17.0)
MCH: 30.4 pg (ref 26.0–34.0)
MCHC: 34.1 g/dL (ref 30.0–36.0)
MCV: 89.2 fL (ref 78.0–100.0)
PLATELETS: 172 10*3/uL (ref 150–400)
RBC: 4.15 MIL/uL — ABNORMAL LOW (ref 4.22–5.81)
RDW: 13.1 % (ref 11.5–15.5)
WBC: 12.1 10*3/uL — ABNORMAL HIGH (ref 4.0–10.5)

## 2013-11-07 LAB — I-STAT CG4 LACTIC ACID, ED: LACTIC ACID, VENOUS: 2.42 mmol/L — AB (ref 0.5–2.2)

## 2013-11-07 LAB — PRO B NATRIURETIC PEPTIDE: Pro B Natriuretic peptide (BNP): 1584 pg/mL — ABNORMAL HIGH (ref 0–450)

## 2013-11-07 LAB — CBG MONITORING, ED: Glucose-Capillary: 91 mg/dL (ref 70–99)

## 2013-11-07 LAB — URINE MICROSCOPIC-ADD ON

## 2013-11-07 NOTE — H&P (Signed)
[facility] History and Physical  Jerry Kerr FOY:[id number] DOB: 1933/05/10 DOA: 11/07/2013  Referring physician: Debby Freiberg, MD PCP: No primary provider on file.   Chief Complaint: Near Syncope  HPI: Jerry Kerr is a 80 y.o. male with prior history of dementia presents with an episode of possible near syncope. Apparently the patient resides in a assisted living community and was taking a stroll outside. He states that next thing he knew he had fallen. He did not hit his head. He did not lose consciousness. Patient states he had no chest pain. He had no injury to his hip or extremities. Patient states that he has no headaches. His companion who is with him states that he thinks he just tripped. Patient has a prior history of cardiac disease and is therefore being admitted for further workup.   Review of Systems:  Constitutional:  No weight loss, night sweats, Fevers HEENT:  No headaches  Cardio-vascular:  No chest pain, Orthopnea, swelling in lower extremities, dizziness, palpitations  GI:  No heartburn, indigestion, abdominal pain, nausea, vomiting, diarrhea Resp:  No shortness of breath with exertion or at rest. No excess mucus, no productive cough, No non-productive cough, No coughing up of blood  Skin:  no rash or lesions. GU:  no dysuria, change in color of urine, no urgency or frequency. Musculoskeletal:  ++back pain.  Psych:  No change in mood or affect. No depression or anxiety. ++memory loss.   Past Medical History  Diagnosis Date  . Asthma   . Dementia   . Back pain   . COPD (chronic obstructive pulmonary disease)   . Hypertension   . Pacemaker   . Anxiety   . Depression   . Cancer     SKIN, PROSTATE  . GERD (gastroesophageal reflux disease)   . Substance abuse     ETOH   Past Surgical History  Procedure Laterality Date  . Neck surgery    . Upper gastrointestinal endoscopy    . Colonoscopy    . Ankle surgery    . Skin graft    .  Pacemaker insertion    . Tonsillectomy     Social History:  reports that he has quit smoking. He has never used smokeless tobacco. He reports that he does not drink alcohol or use illicit drugs.  No Known Allergies  Family History  Problem Relation Age of Onset  . CAD Father   . Heart disease Father   . Hypertension Mother   . AAA (abdominal aortic aneurysm) Mother   . Heart disease Mother      Prior to Admission medications   Medication Sig Start Date End Date Taking? Authorizing Provider  acetaminophen (TYLENOL) 500 MG tablet Take 500 mg by mouth every 6 (six) hours as needed.   Yes Historical Provider, MD  albuterol (PROAIR HFA) 108 (90 BASE) MCG/ACT inhaler Inhale 2 puffs into the lungs every 6 (six) hours as needed for wheezing or shortness of breath.   Yes Historical Provider, MD  aspirin 81 MG tablet Take 81 mg by mouth daily.   Yes Historical Provider, MD  divalproex (DEPAKOTE SPRINKLE) 125 MG capsule Take 500 mg by mouth 2 (two) times daily.   Yes Historical Provider, MD  Lactobacillus Rhamnosus, GG, (CULTURELLE KIDS) PACK Take 1 packet by mouth daily.   Yes Historical Provider, MD  lisinopril (PRINIVIL,ZESTRIL) 5 MG tablet Take 5 mg by mouth daily.   Yes Historical Provider, MD  LORazepam (ATIVAN) 0.5 MG tablet Take 0.5 mg  by mouth every 8 (eight) hours.   Yes Historical Provider, MD  LORazepam (ATIVAN) 1 MG tablet Take 1 mg by mouth See admin instructions. Take 1 tab by mouth every 6 hours as needed for anxiety and 1 tablet at bedtime   Yes Historical Provider, MD  memantine (NAMENDA) 10 MG tablet Take 10 mg by mouth 2 (two) times daily.   Yes Historical Provider, MD  metoprolol (LOPRESSOR) 50 MG tablet Take 50 mg by mouth 2 (two) times daily.   Yes Historical Provider, MD  omeprazole (PRILOSEC) 20 MG capsule Take 20 mg by mouth daily.   Yes Historical Provider, MD  potassium chloride 20 MEQ/15ML (10%) solution Take 20 mEq by mouth daily.   Yes Historical Provider, MD    QUEtiapine (SEROQUEL) 50 MG tablet Take 50 mg by mouth at bedtime.   Yes Historical Provider, MD  sertraline (ZOLOFT) 50 MG tablet Take 50 mg by mouth daily.   Yes Historical Provider, MD  tiotropium (SPIRIVA) 18 MCG inhalation capsule Place 18 mcg into inhaler and inhale daily.   Yes Historical Provider, MD  traMADol (ULTRAM) 50 MG tablet Take by mouth every 6 (six) hours as needed.   Yes Historical Provider, MD  alum & mag hydroxide-simeth (MAALOX ADVANCED) 200-200-20 MG/5ML suspension Take 30 mLs by mouth every 6 (six) hours as needed for indigestion or heartburn.    Historical Provider, MD  loperamide (IMODIUM) 2 MG capsule Take 2 mg by mouth as needed for diarrhea or loose stools.    Historical Provider, MD  traZODone (DESYREL) 50 MG tablet Take 50 mg by mouth at bedtime.    Historical Provider, MD   Physical Exam: Filed Vitals:   11/07/13 2000 11/07/13 2149 11/07/13 2247  BP: 189/101 174/79 195/86  Temp: 98.8 F (37.1 C)    TempSrc: Oral    Resp: 17 17 21   SpO2: 97% 94% 94%    Wt Readings from Last 3 Encounters:  08/29/13 68.947 kg (152 lb)  06/20/13 68.947 kg (152 lb)    General:  Appears calm and comfortable Eyes: PERRL, normal lids, irises & conjunctiva ENT: grossly normal hearing, lips & tongue Neck: no LAD, masses or thyromegaly Cardiovascular: RRR, no m/r/g. No LE edema. Respiratory: CTA bilaterally, no w/r/r. Normal respiratory effort. Abdomen: soft, ntnd Skin: no rash or induration seen on limited exam Musculoskeletal: grossly normal tone BUE/BLE Psychiatric: grossly normal mood pleasent Neurologic: grossly non-focal.          Labs on Admission:  Basic Metabolic Panel:  Recent Labs Lab 11/07/13 2031 11/07/13 2048  NA 137 138  K 4.4 4.3  CL 98 101  CO2 24  --   GLUCOSE 90 89  BUN 19 18  CREATININE 0.89 0.90  CALCIUM 10.1  --    Liver Function Tests:  Recent Labs Lab 11/07/13 2031  AST 18  ALT 11  ALKPHOS 103  BILITOT 0.4  PROT 7.0   ALBUMIN 3.4*   No results found for this basename: LIPASE, AMYLASE,  in the last 168 hours No results found for this basename: AMMONIA,  in the last 168 hours CBC:  Recent Labs Lab 11/07/13 2031 11/07/13 2048  WBC 12.1*  --   HGB 12.6* 13.6  HCT 37.0* 40.0  MCV 89.2  --   PLT 172  --    Cardiac Enzymes: No results found for this basename: CKTOTAL, CKMB, CKMBINDEX, TROPONINI,  in the last 168 hours  BNP (last 3 results)  Recent Labs  11/07/13 2031  PROBNP 1584.0*   CBG:  Recent Labs Lab 11/07/13 2035  GLUCAP 91    Radiological Exams on Admission: Dg Chest 2 View  11/07/2013   CLINICAL DATA:  Weakness.  History of fall.  EXAM: CHEST  2 VIEW  COMPARISON:  Chest x-ray 08/29/2013.  FINDINGS: Emphysematous changes again noted in the lungs bilaterally. No acute consolidative airspace disease. No pleural effusions. No evidence of pulmonary edema. Heart size is normal. The patient is rotated to the left on today's exam, resulting in distortion of the mediastinal contours and reduced diagnostic sensitivity and specificity for mediastinal pathology. Atherosclerosis in the thoracic aorta. Left-sided pacemaker device in place with lead tip projecting over the right atrium and right ventricular apex. Orthopedic fixation hardware in the lower cervical spine is incompletely visualized. Old fracture of the lateral aspect of the right seventh rib.  IMPRESSION: 1. No radiographic evidence of acute cardiopulmonary disease. 2. Emphysema. 3. Atherosclerosis.   Electronically Signed   By: Vinnie Langton M.D.   On: 11/07/2013 21:41   Ct Head Wo Contrast  11/07/2013   CLINICAL DATA:  Generalized weakness  EXAM: CT HEAD WITHOUT CONTRAST  TECHNIQUE: Contiguous axial images were obtained from the base of the skull through the vertex without intravenous contrast.  COMPARISON:  03/23/2012  FINDINGS: The brain shows chronic generalized atrophy. There chronic small-vessel changes of the deep white matter.  No sign of acute infarction, mass lesion, hemorrhage, hydrocephalus or extra-axial collection. Sinuses, middle ears and mastoids are clear. No skull or skullbase lesion. There is atherosclerotic calcification of the major vessels at the base of the brain.  IMPRESSION: No acute finding. Chronic atrophy. Chronic small-vessel disease of the white matter.   Electronically Signed   By: Nelson Chimes M.D.   On: 11/07/2013 21:27     Assessment/Plan Principal Problem:   Near syncope Active Problems:   COPD (chronic obstructive pulmonary disease)   Hypertension   Dementia   1. Near Syncope -based on his companions observation he likely fell -no LOC noted -he is back to baseline at this time -will admit for observation and check enzymes  2. COPD -will continue with inhalers -oxygen as needed  3. Hypertension -will continue with home medications -Currently elevated  4. Dementia -continue with present home medications    Code Status: Full Code (must indicate code status--if unknown or must be presumed, indicate so) DVT Prophylaxis:Heparin Family Communication: No Immediate family present (indicate person spoken with, if applicable, with phone number if by telephone) Disposition Plan: Home (indicate anticipated LOS)  Time spent: 93min  Izabell Schalk A [facility] Pager [phone]  **Disclaimer: This note may have been dictated with voice recognition software. Similar sounding words can inadvertently be transcribed and this note may contain transcription errors which may not have been corrected upon publication of note.**

## 2013-11-07 NOTE — ED Provider Notes (Signed)
CSN: [id number]     Arrival date & time 11/07/13  1948 History   First MD Initiated Contact with Patient 11/07/13 2013     Chief Complaint  Patient presents with  . Weakness     (Consider location/radiation/quality/duration/timing/severity/associated sxs/prior Treatment) Patient is a 80 y.o. male presenting with general illness.  Illness Location:  Generalized Quality:  Weakness Severity:  Moderate Onset quality:  Gradual Duration:  1 day Timing:  Constant Progression:  Worsening Chronicity:  New Context:  Dementia, lives in [place], stumbled 1 time Relieved by:  Nothing Worsened by:  Nothing Associated symptoms: cough   Associated symptoms: no abdominal pain, no chest pain, no congestion, no diarrhea, no fever (but has had chills), no headaches, no loss of consciousness, no nausea, no rash, no rhinorrhea, no shortness of breath, no sore throat and no vomiting     Past Medical History  Diagnosis Date  . Asthma   . Dementia   . Back pain   . COPD (chronic obstructive pulmonary disease)   . Hypertension   . Pacemaker   . Anxiety   . Depression   . Cancer     SKIN, PROSTATE  . GERD (gastroesophageal reflux disease)   . Substance abuse     ETOH   Past Surgical History  Procedure Laterality Date  . Neck surgery    . Upper gastrointestinal endoscopy    . Colonoscopy    . Ankle surgery    . Skin graft    . Pacemaker insertion    . Tonsillectomy     Family History  Problem Relation Age of Onset  . CAD Father   . Heart disease Father   . Hypertension Mother   . AAA (abdominal aortic aneurysm) Mother   . Heart disease Mother    History  Substance Use Topics  . 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 Smokeless tobacco: Never Used  . Alcohol Use: No    Review of Systems  Constitutional: Negative for fever (but has had chills) and chills.  HENT: Negative for congestion, rhinorrhea and sore throat.   Eyes: Negative for photophobia and visual disturbance.   Respiratory: Positive for cough. Negative for shortness of breath.   Cardiovascular: Negative for chest pain and leg swelling.  Gastrointestinal: Negative for nausea, vomiting, abdominal pain, diarrhea and constipation.  Endocrine: Negative for polydipsia and polyuria.  Genitourinary: Negative for dysuria and hematuria.  Musculoskeletal: Negative for arthralgias and back pain.  Skin: Negative for color change and rash.  Neurological: Negative for dizziness, loss of consciousness, syncope, light-headedness and headaches.  Hematological: Negative for adenopathy. Does not bruise/bleed easily.  All other systems reviewed and are negative.     Allergies  Review of patient's allergies indicates no known allergies.  Home Medications   Prior to Admission medications   Medication Sig Start Date End Date Taking? Authorizing Provider  acetaminophen (TYLENOL) 500 MG tablet Take 500 mg by mouth every 6 (six) hours as needed.   Yes Historical Provider, MD  albuterol (PROAIR HFA) 108 (90 BASE) MCG/ACT inhaler Inhale 2 puffs into the lungs every 6 (six) hours as needed for wheezing or shortness of breath.   Yes Historical Provider, MD  aspirin 81 MG tablet Take 81 mg by mouth daily.   Yes Historical Provider, MD  divalproex (DEPAKOTE SPRINKLE) 125 MG capsule Take 500 mg by mouth 2 (two) times daily.   Yes Historical Provider, MD  Lactobacillus Rhamnosus, GG, (CULTURELLE KIDS) PACK Take 1 packet by mouth daily.  Yes Historical Provider, MD  lisinopril (PRINIVIL,ZESTRIL) 5 MG tablet Take 5 mg by mouth daily.   Yes Historical Provider, MD  LORazepam (ATIVAN) 0.5 MG tablet Take 0.5 mg by mouth every 8 (eight) hours.   Yes Historical Provider, MD  LORazepam (ATIVAN) 1 MG tablet Take 1 mg by mouth See admin instructions. Take 1 tab by mouth every 6 hours as needed for anxiety and 1 tablet at bedtime   Yes Historical Provider, MD  memantine (NAMENDA) 10 MG tablet Take 10 mg by mouth 2 (two) times daily.    Yes Historical Provider, MD  metoprolol (LOPRESSOR) 50 MG tablet Take 50 mg by mouth 2 (two) times daily.   Yes Historical Provider, MD  omeprazole (PRILOSEC) 20 MG capsule Take 20 mg by mouth daily.   Yes Historical Provider, MD  potassium chloride 20 MEQ/15ML (10%) solution Take 20 mEq by mouth daily.   Yes Historical Provider, MD  QUEtiapine (SEROQUEL) 50 MG tablet Take 50 mg by mouth at bedtime.   Yes Historical Provider, MD  sertraline (ZOLOFT) 50 MG tablet Take 50 mg by mouth daily.   Yes Historical Provider, MD  tiotropium (SPIRIVA) 18 MCG inhalation capsule Place 18 mcg into inhaler and inhale daily.   Yes Historical Provider, MD  traMADol (ULTRAM) 50 MG tablet Take by mouth every 6 (six) hours as needed.   Yes Historical Provider, MD  alum & mag hydroxide-simeth (MAALOX ADVANCED) 200-200-20 MG/5ML suspension Take 30 mLs by mouth every 6 (six) hours as needed for indigestion or heartburn.    Historical Provider, MD  loperamide (IMODIUM) 2 MG capsule Take 2 mg by mouth as needed for diarrhea or loose stools.    Historical Provider, MD  traZODone (DESYREL) 50 MG tablet Take 50 mg by mouth at bedtime.    Historical Provider, MD   BP 195/86  Temp(Src) 98.8 F (37.1 C) (Oral)  Resp 21  SpO2 94% Physical Exam  Vitals reviewed. Constitutional: He appears well-developed and well-nourished.  HENT:  Head: Normocephalic and atraumatic.  Eyes: Conjunctivae and EOM are normal.  Neck: Normal range of motion. Neck supple.  Cardiovascular: Normal rate, regular rhythm and normal heart sounds.   Pulmonary/Chest: Effort normal and breath sounds normal. No respiratory distress.  Abdominal: He exhibits no distension. There is no tenderness. There is no rebound and no guarding.  Musculoskeletal: Normal range of motion.  Neurological: He is alert. He has normal strength and normal reflexes. He is disoriented (chronically). No cranial nerve deficit or sensory deficit.  Skin: Skin is warm and dry.     ED Course  Procedures (including critical care time) Labs Review Labs Reviewed  CBC - Abnormal; Notable for the following:    WBC 12.1 (*)    RBC 4.15 (*)    Hemoglobin 12.6 (*)    HCT 37.0 (*)    All other components within normal limits  COMPREHENSIVE METABOLIC PANEL - Abnormal; Notable for the following:    Albumin 3.4 (*)    GFR calc non Af Amer 80 (*)    All other components within normal limits  PRO B NATRIURETIC PEPTIDE - Abnormal; Notable for the following:    Pro B Natriuretic peptide (BNP) 1584.0 (*)    All other components within normal limits  URINALYSIS, ROUTINE W REFLEX MICROSCOPIC - Abnormal; Notable for the following:    Hgb urine dipstick SMALL (*)    All other components within normal limits  I-STAT CG4 LACTIC ACID, ED - Abnormal; Notable for the  following:    Lactic Acid, Venous 2.42 (*)    All other components within normal limits  URINE MICROSCOPIC-ADD ON  CBG MONITORING, ED  I-STAT CHEM 8, ED  Randolm Idol, ED    Imaging Review Dg Chest 2 View  11/07/2013   CLINICAL DATA:  Weakness.  History of fall.  EXAM: CHEST  2 VIEW  COMPARISON:  Chest x-ray 08/29/2013.  FINDINGS: Emphysematous changes again noted in the lungs bilaterally. No acute consolidative airspace disease. No pleural effusions. No evidence of pulmonary edema. Heart size is normal. The patient is rotated to the left on today's exam, resulting in distortion of the mediastinal contours and reduced diagnostic sensitivity and specificity for mediastinal pathology. Atherosclerosis in the thoracic aorta. Left-sided pacemaker device in place with lead tip projecting over the right atrium and right ventricular apex. Orthopedic fixation hardware in the lower cervical spine is incompletely visualized. Old fracture of the lateral aspect of the right seventh rib.  IMPRESSION: 1. No radiographic evidence of acute cardiopulmonary disease. 2. Emphysema. 3. Atherosclerosis.   Electronically Signed   By:  Vinnie Langton M.D.   On: 11/07/2013 21:41   Ct Head Wo Contrast  11/07/2013   CLINICAL DATA:  Generalized weakness  EXAM: CT HEAD WITHOUT CONTRAST  TECHNIQUE: Contiguous axial images were obtained from the base of the skull through the vertex without intravenous contrast.  COMPARISON:  03/23/2012  FINDINGS: The brain shows chronic generalized atrophy. There chronic small-vessel changes of the deep white matter. No sign of acute infarction, mass lesion, hemorrhage, hydrocephalus or extra-axial collection. Sinuses, middle ears and mastoids are clear. No skull or skullbase lesion. There is atherosclerotic calcification of the major vessels at the base of the brain.  IMPRESSION: No acute finding. Chronic atrophy. Chronic small-vessel disease of the white matter.   Electronically Signed   By: Nelson Chimes M.D.   On: 11/07/2013 21:27     EKG Interpretation None       Date: 11/07/2013  Rate: 77  Rhythm: normal sinus rhythm  QRS Axis: normal  Intervals: normal  ST/T Wave abnormalities: normal  Conduction Disutrbances: none  Narrative Interpretation: unremarkable      MDM   Final diagnoses:  None    80 y.o. male  with pertinent PMH of dementia, HTN, COPD presents with generalized weakness and stumbling episode today.  Pt was in normal state of health, and is generally functional and able A. metolazone, however today he was walking with one of his caregivers and nearly fell. They deny any syncope, antecedent symptoms, or hit the head, however the mechanism is unclear based on the patient's history of dementia.  On arrival physical exam as above without signs of trauma, no focal motor deficits on exam, patient mildly hypoxic at times, however maintaining sats greater than 90%.    Labs and imaging as above reviewed. BNP elevated and pt with mild bibasilar rales.  No echo was available for review, and the patient has no history of CHF which is documented.  Attempted to ambulate pt multiple  times, initially he was unable, then able to ambulate with significant assistance.  In speaking with caregiver this is apparently a significant decrease from baseline, however at times the patient does need significant assistance walking and refuses to ask for the assistance in this facility.  He caregiver does not feel the patient would be safe returning to the facility at this time.  Consult hospitalist for admission.    1. Near syncope  Debby Freiberg, MD 11/07/13 [phone]

## 2013-11-07 NOTE — ED Notes (Signed)
Pt here from [facility] via [facility] [place] c/o generalized weakness and "being cold" since this a.m. Pt reports pain, shortness of breath. Pt alert and oriented per baseline.

## 2013-11-07 NOTE — ED Notes (Signed)
Family at bedside. 

## 2013-11-07 NOTE — ED Notes (Signed)
Bed: LT53 Expected date:  Expected time:  Means of arrival:  Comments: EMS/80 yo male from [facility]

## 2013-11-07 NOTE — ED Notes (Signed)
MD at bedside. 

## 2013-11-07 NOTE — ED Notes (Signed)
Family contacts persons: Will [phone] son in-law [name] [phone] daughter

## 2013-11-08 ENCOUNTER — Observation Stay (HOSPITAL_COMMUNITY): Payer: Medicare Other

## 2013-11-08 DIAGNOSIS — Q393 Congenital stenosis and stricture of esophagus: Secondary | ICD-10-CM

## 2013-11-08 DIAGNOSIS — F0390 Unspecified dementia without behavioral disturbance: Secondary | ICD-10-CM

## 2013-11-08 DIAGNOSIS — M48061 Spinal stenosis, lumbar region without neurogenic claudication: Secondary | ICD-10-CM

## 2013-11-08 DIAGNOSIS — IMO0002 Reserved for concepts with insufficient information to code with codable children: Secondary | ICD-10-CM

## 2013-11-08 DIAGNOSIS — Q391 Atresia of esophagus with tracheo-esophageal fistula: Secondary | ICD-10-CM

## 2013-11-08 DIAGNOSIS — R1314 Dysphagia, pharyngoesophageal phase: Secondary | ICD-10-CM

## 2013-11-08 DIAGNOSIS — R55 Syncope and collapse: Secondary | ICD-10-CM | POA: Diagnosis not present

## 2013-11-08 LAB — TROPONIN I
Troponin I: <0.3 ng/mL (ref <0.30)
Troponin I: <0.3 ng/mL (ref <0.30)

## 2013-11-08 LAB — TSH: TSH: 2.23 u[IU]/mL (ref 0.350–4.500)

## 2013-11-08 LAB — CBC
HCT: 34.8 % — ABNORMAL LOW (ref 39.0–52.0)
HEMOGLOBIN: 11.4 g/dL — AB (ref 13.0–17.0)
MCH: 29.5 pg (ref 26.0–34.0)
MCHC: 32.8 g/dL (ref 30.0–36.0)
MCV: 90.2 fL (ref 78.0–100.0)
Platelets: 149 10*3/uL — ABNORMAL LOW (ref 150–400)
RBC: 3.86 MIL/uL — ABNORMAL LOW (ref 4.22–5.81)
RDW: 13.3 % (ref 11.5–15.5)
WBC: 10.9 10*3/uL — ABNORMAL HIGH (ref 4.0–10.5)

## 2013-11-08 LAB — COMPREHENSIVE METABOLIC PANEL
ALK PHOS: 88 U/L (ref 39–117)
ALT: 8 U/L (ref 0–53)
AST: 14 U/L (ref 0–37)
Albumin: 3 g/dL — ABNORMAL LOW (ref 3.5–5.2)
Anion gap: 12 (ref 5–15)
BUN: 17 mg/dL (ref 6–23)
CALCIUM: 9.3 mg/dL (ref 8.4–10.5)
CO2: 25 meq/L (ref 19–32)
Chloride: 98 mEq/L (ref 96–112)
Creatinine, Ser: 0.91 mg/dL (ref 0.50–1.35)
GFR, EST NON AFRICAN AMERICAN: 79 mL/min — AB (ref >90)
Glucose, Bld: 109 mg/dL — ABNORMAL HIGH (ref 70–99)
Potassium: 4.2 mEq/L (ref 3.7–5.3)
SODIUM: 135 meq/L — AB (ref 137–147)
TOTAL PROTEIN: 6.1 g/dL (ref 6.0–8.3)
Total Bilirubin: 0.5 mg/dL (ref 0.3–1.2)

## 2013-11-08 LAB — HEMOGLOBIN A1C
Hgb A1c MFr Bld: 5.7 % — ABNORMAL HIGH (ref <5.7)
MEAN PLASMA GLUCOSE: 117 mg/dL — AB (ref <117)

## 2013-11-08 LAB — MRSA PCR SCREENING: MRSA by PCR: POSITIVE — AB

## 2013-11-08 MED ORDER — SODIUM CHLORIDE 0.9 % IV SOLN
INTRAVENOUS | Status: DC
  Administered 2013-11-08 (×2): via INTRAVENOUS

## 2013-11-08 MED ORDER — ONDANSETRON HCL 4 MG PO TABS: 4.0000 mg | ORAL_TABLET | Freq: Four times a day (QID) | ORAL | Status: DC | PRN

## 2013-11-08 MED ORDER — QUETIAPINE FUMARATE 50 MG PO TABS
50.0000 mg | ORAL_TABLET | Freq: Every day | ORAL | Status: DC
  Administered 2013-11-08 (×2): 50 mg via ORAL
  Filled 2013-11-08 (×3): qty 1

## 2013-11-08 MED ORDER — MEMANTINE HCL 10 MG PO TABS
10.0000 mg | ORAL_TABLET | Freq: Two times a day (BID) | ORAL | Status: DC
  Administered 2013-11-08 – 2013-11-09 (×4): 10 mg via ORAL
  Filled 2013-11-08 (×5): qty 1

## 2013-11-08 MED ORDER — TIOTROPIUM BROMIDE MONOHYDRATE 18 MCG IN CAPS
18.0000 ug | ORAL_CAPSULE | Freq: Every day | RESPIRATORY_TRACT | Status: DC
  Filled 2013-11-08: qty 5

## 2013-11-08 MED ORDER — IOHEXOL 300 MG/ML  SOLN
100.0000 mL | Freq: Once | INTRAMUSCULAR | Status: AC | PRN
  Administered 2013-11-08: 100 mL via INTRAVENOUS

## 2013-11-08 MED ORDER — ADULT MULTIVITAMIN W/MINERALS CH
1.0000 | ORAL_TABLET | Freq: Every day | ORAL | Status: DC
  Administered 2013-11-08 – 2013-11-09 (×2): 1 via ORAL
  Filled 2013-11-08 (×2): qty 1

## 2013-11-08 MED ORDER — PANTOPRAZOLE SODIUM 40 MG PO TBEC
40.0000 mg | DELAYED_RELEASE_TABLET | Freq: Every day | ORAL | Status: DC
  Administered 2013-11-08 – 2013-11-09 (×2): 40 mg via ORAL
  Filled 2013-11-08 (×2): qty 1

## 2013-11-08 MED ORDER — LORAZEPAM 1 MG PO TABS: 1.0000 mg | ORAL_TABLET | Freq: Four times a day (QID) | ORAL | Status: DC | PRN

## 2013-11-08 MED ORDER — RISAQUAD PO CAPS
1.0000 | ORAL_CAPSULE | Freq: Every day | ORAL | Status: DC
  Administered 2013-11-08 – 2013-11-09 (×2): 1 via ORAL
  Filled 2013-11-08 (×2): qty 1

## 2013-11-08 MED ORDER — DIVALPROEX SODIUM 125 MG PO CPSP
500.0000 mg | ORAL_CAPSULE | Freq: Two times a day (BID) | ORAL | Status: DC
  Administered 2013-11-08 – 2013-11-09 (×4): 500 mg via ORAL
  Filled 2013-11-08 (×5): qty 4

## 2013-11-08 MED ORDER — LOPERAMIDE HCL 2 MG PO CAPS: 2.0000 mg | ORAL_CAPSULE | ORAL | Status: DC | PRN

## 2013-11-08 MED ORDER — HEPARIN SODIUM (PORCINE) 5000 UNIT/ML IJ SOLN
5000.0000 [IU] | Freq: Three times a day (TID) | INTRAMUSCULAR | Status: DC
  Administered 2013-11-08 – 2013-11-09 (×4): 5000 [IU] via SUBCUTANEOUS
  Filled 2013-11-08 (×7): qty 1

## 2013-11-08 MED ORDER — SODIUM CHLORIDE 0.9 % IJ SOLN
3.0000 mL | Freq: Two times a day (BID) | INTRAMUSCULAR | Status: DC
  Administered 2013-11-08 (×2): 3 mL via INTRAVENOUS

## 2013-11-08 MED ORDER — OXYCODONE HCL 5 MG PO TABS: 5.0000 mg | ORAL_TABLET | ORAL | Status: DC | PRN

## 2013-11-08 MED ORDER — TRAZODONE HCL 50 MG PO TABS
50.0000 mg | ORAL_TABLET | Freq: Every day | ORAL | Status: DC
  Administered 2013-11-08 (×2): 50 mg via ORAL
  Filled 2013-11-08 (×3): qty 1

## 2013-11-08 MED ORDER — FOLIC ACID 1 MG PO TABS
1.0000 mg | ORAL_TABLET | Freq: Every day | ORAL | Status: DC
  Administered 2013-11-08 – 2013-11-09 (×2): 1 mg via ORAL
  Filled 2013-11-08 (×2): qty 1

## 2013-11-08 MED ORDER — METOPROLOL TARTRATE 50 MG PO TABS
50.0000 mg | ORAL_TABLET | Freq: Two times a day (BID) | ORAL | Status: DC
  Administered 2013-11-08 – 2013-11-09 (×4): 50 mg via ORAL
  Filled 2013-11-08 (×5): qty 1

## 2013-11-08 MED ORDER — RESOURCE THICKENUP CLEAR PO POWD
ORAL | Status: DC | PRN
  Administered 2013-11-08: 22:00:00 via ORAL
  Filled 2013-11-08: qty 125

## 2013-11-08 MED ORDER — ONDANSETRON HCL 4 MG/2ML IJ SOLN: 4.0000 mg | Freq: Four times a day (QID) | INTRAMUSCULAR | Status: DC | PRN

## 2013-11-08 MED ORDER — ASPIRIN 81 MG PO CHEW
81.0000 mg | CHEWABLE_TABLET | Freq: Every day | ORAL | Status: DC
  Administered 2013-11-08 – 2013-11-09 (×2): 81 mg via ORAL
  Filled 2013-11-08 (×2): qty 1

## 2013-11-08 MED ORDER — SERTRALINE HCL 50 MG PO TABS
50.0000 mg | ORAL_TABLET | Freq: Every day | ORAL | Status: DC
  Administered 2013-11-08 – 2013-11-09 (×2): 50 mg via ORAL
  Filled 2013-11-08 (×2): qty 1

## 2013-11-08 MED ORDER — TRAMADOL HCL 50 MG PO TABS
50.0000 mg | ORAL_TABLET | Freq: Four times a day (QID) | ORAL | Status: DC | PRN
  Administered 2013-11-08 (×2): 50 mg via ORAL
  Filled 2013-11-08 (×2): qty 1

## 2013-11-08 MED ORDER — ALBUTEROL SULFATE (2.5 MG/3ML) 0.083% IN NEBU
3.0000 mL | INHALATION_SOLUTION | Freq: Four times a day (QID) | RESPIRATORY_TRACT | Status: DC | PRN
  Administered 2013-11-08 (×2): 3 mL via RESPIRATORY_TRACT
  Filled 2013-11-08 (×2): qty 3

## 2013-11-08 MED ORDER — LISINOPRIL 5 MG PO TABS
5.0000 mg | ORAL_TABLET | Freq: Every day | ORAL | Status: DC
  Administered 2013-11-08 – 2013-11-09 (×2): 5 mg via ORAL
  Filled 2013-11-08 (×2): qty 1

## 2013-11-08 MED ORDER — DOCUSATE SODIUM 100 MG PO CAPS
100.0000 mg | ORAL_CAPSULE | Freq: Two times a day (BID) | ORAL | Status: DC
  Administered 2013-11-08 – 2013-11-09 (×3): 100 mg via ORAL
  Filled 2013-11-08 (×5): qty 1

## 2013-11-08 MED ORDER — VITAMIN B-1 100 MG PO TABS
100.0000 mg | ORAL_TABLET | Freq: Every day | ORAL | Status: DC
  Administered 2013-11-08 – 2013-11-09 (×2): 100 mg via ORAL
  Filled 2013-11-08 (×2): qty 1

## 2013-11-08 MED ORDER — ALUM & MAG HYDROXIDE-SIMETH 200-200-20 MG/5ML PO SUSP: 30.0000 mL | Freq: Four times a day (QID) | ORAL | Status: DC | PRN

## 2013-11-08 NOTE — Procedures (Signed)
Objective Swallowing Evaluation: Modified Barium Swallowing Study  Patient Details  Name: Jerry Kerr MRN: [id number] Date of Birth: 1933/04/04  Today's Date: 11/08/2013 Time: 1445-1510 SLP Time Calculation (min): 25 min  Past Medical History:  Past Medical History  Diagnosis Date  . Asthma   . Dementia   . Back pain   . COPD (chronic obstructive pulmonary disease)   . Hypertension   . Pacemaker   . Anxiety   . Depression   . Cancer     SKIN, PROSTATE  . GERD (gastroesophageal reflux disease)   . Substance abuse     ETOH   Past Surgical History:  Past Surgical History  Procedure Laterality Date  . Neck surgery    . Upper gastrointestinal endoscopy    . Colonoscopy    . Ankle surgery    . Skin graft    . Pacemaker insertion    . Tonsillectomy     HPI:  Jerry Kerr is a 80 y.o. male with prior history of dementia presents with an episode of possible near syncope. Pt has a history of GERD. Was observed to have a choing episode today with breakfast, this is a new problem per pt. 2008 Barium swallow demonstrated Barretts esophagus, inflammation of distal esophagus, significant delay of pill passing distal esophagus.      Assessment / Plan / Recommendation Clinical Impression  Dysphagia Diagnosis: Severe cervical esophageal phase dysphagia;Severe pharyngeal phase dysphagia Clinical impression: Pt demonstrates a primary cervical esophageal dysphagia due to cervical hardware impacting oropharyngeal function. Pts oral function is sufficient. There is cervical hardware extending from C4 to C7 pressing against pts UES and impeding hyolaryngeal movement and epiglottic deflection. Pt also with significant forward leaning posture. As pt transit bolus to pharynx the airway cannot fully close and 50% of the bolus is penetrated/aspirated with resulting hard cough. Pt does expel  and swallow most aspirate with coughing and swallowing. Nectar thick liquids and pureed textures result in min  to moderate penetration that pt can clear with cues to clear throat and swallow again. Masticated solids and pills are likely to lodge more signficantly in the vestibule with high risk of asphyxiation. Compensatory postures were not effective in increasing airway protection. Recommend dys 1 (puree) diet and nectar thick liquids though aspiration risk will still be moderate to severe. Upright posture is essential.     Treatment Recommendation  F/U MBS in ___ days (Comment)    Diet Recommendation Dysphagia 1 (Puree);Nectar-thick liquid   Liquid Administration via: Cup Medication Administration: Crushed with puree Supervision: Full supervision/cueing for compensatory strategies Compensations: Slow rate;Small sips/bites;Multiple dry swallows after each bite/sip;Clear throat after each swallow Postural Changes and/or Swallow Maneuvers: Seated upright 90 degrees;Upright 30-60 min after meal;Out of bed for meals    Other  Recommendations Recommended Consults: MBS Oral Care Recommendations: Oral care BID Other Recommendations: Order thickener from pharmacy   Follow Up Recommendations  Skilled Nursing facility    Frequency and Duration min 2x/week  2 weeks   Pertinent Vitals/Pain NA    SLP Swallow Goals     General HPI: [name] is a 80 y.o. male with prior history of dementia presents with an episode of possible near syncope. Pt has a history of GERD. Was observed to have a choing episode today with breakfast, this is a new problem per pt. 2008 Barium swallow demonstrated Barretts esophagus, inflammation of distal esophagus, significant delay of pill passing distal esophagus.  Type of Study: Modified Barium Swallowing Study Reason for Referral:  Objectively evaluate swallowing function Previous Swallow Assessment: see HPI Diet Prior to this Study: NPO Temperature Spikes Noted: No Respiratory Status: Room air History of Recent Intubation: No Behavior/Cognition: Alert;Cooperative Oral  Cavity - Dentition: Poor condition Oral Motor / Sensory Function: Within functional limits Self-Feeding Abilities: Able to feed self Patient Positioning: Upright in bed Baseline Vocal Quality: Wet Volitional Cough: Strong Volitional Swallow: Able to elicit Anatomy: Other (Comment) (Posterior and anterior cervical hardware from C4-7) Pharyngeal Secretions: Not observed secondary MBS    Reason for Referral Objectively evaluate swallowing function   Oral Phase Oral Preparation/Oral Phase Oral Phase: WFL   Pharyngeal Phase Pharyngeal Phase Pharyngeal Phase: Impaired Pharyngeal - Nectar Pharyngeal - Nectar Teaspoon: Reduced epiglottic inversion;Reduced airway/laryngeal closure;Reduced anterior laryngeal mobility;Penetration/Aspiration during swallow;Penetration/Aspiration after swallow;Pharyngeal residue - valleculae Penetration/Aspiration details (nectar teaspoon): Material enters airway, remains ABOVE vocal cords then ejected out;Material enters airway, remains ABOVE vocal cords and not ejected out Pharyngeal - Nectar Cup: Reduced epiglottic inversion;Reduced airway/laryngeal closure;Reduced anterior laryngeal mobility;Penetration/Aspiration during swallow;Penetration/Aspiration after swallow;Pharyngeal residue - valleculae Penetration/Aspiration details (nectar cup): Material enters airway, remains ABOVE vocal cords then ejected out;Material enters airway, remains ABOVE vocal cords and not ejected out;Material does not enter airway Pharyngeal - Nectar Straw: Not tested Pharyngeal - Thin Pharyngeal - Thin Cup: Reduced epiglottic inversion;Reduced airway/laryngeal closure;Reduced anterior laryngeal mobility;Penetration/Aspiration during swallow;Penetration/Aspiration after swallow;Pharyngeal residue - valleculae;Significant aspiration (Amount) Penetration/Aspiration details (thin cup): Material enters airway, passes BELOW cords then ejected out;Material enters airway, CONTACTS cords then ejected  out;Material enters airway, passes BELOW cords and not ejected out despite cough attempt by patient Pharyngeal - Thin Straw: Reduced epiglottic inversion;Reduced airway/laryngeal closure;Reduced anterior laryngeal mobility;Penetration/Aspiration during swallow;Penetration/Aspiration after swallow;Pharyngeal residue - valleculae;Significant aspiration (Amount) (head turn) Penetration/Aspiration details (thin straw): Material enters airway, CONTACTS cords then ejected out;Material enters airway, passes BELOW cords then ejected out;Material enters airway, passes BELOW cords and not ejected out despite cough attempt by patient Pharyngeal - Solids Pharyngeal - Puree: Reduced epiglottic inversion;Reduced airway/laryngeal closure;Reduced anterior laryngeal mobility;Pharyngeal residue - valleculae Penetration/Aspiration details (puree): Material does not enter airway Pharyngeal - Regular: Reduced epiglottic inversion;Reduced airway/laryngeal closure;Reduced anterior laryngeal mobility;Pharyngeal residue - valleculae;Penetration/Aspiration after swallow Penetration/Aspiration details (regular): Material enters airway, remains ABOVE vocal cords then ejected out  Cervical Esophageal Phase    GO    Cervical Esophageal Phase Cervical Esophageal Phase: Impaired (See impression statement)    Functional Assessment Tool Used: clinical judgement Functional Limitations: Swallowing Swallow Current Status (G2542): At least 40 percent but less than 60 percent impaired, limited or restricted Swallow Goal Status [phone]): At least 20 percent but less than 40 percent impaired, limited or restricted   [facility], [place] CCC-SLP [phone]   Lynann Beaver 11/08/2013, 3:37 PM

## 2013-11-08 NOTE — Progress Notes (Signed)
Clinical Social Work Department BRIEF PSYCHOSOCIAL ASSESSMENT 11/08/2013  Patient:  Jerry Kerr, Jerry Kerr     Account Number:  [id number]     Admit date:  11/07/2013  Clinical Social Worker:  Earlie Server  Date/Time:  11/08/2013 01:45 PM  Referred by:  Physician  Date Referred:  11/08/2013 Referred for  ALF Placement   Other Referral:   Interview type:  Family Other interview type:    PSYCHOSOCIAL DATA Living Status:  FACILITY Admitted from [facility] Level of care:  Assisted Living Primary support name:  Kitty Primary support relationship to patient:  CHILD, ADULT Degree of support available:   Strong    CURRENT CONCERNS Current Concerns  Post-Acute Placement   Other Concerns:    SOCIAL WORK ASSESSMENT / PLAN CSW received referral due to patient being admitted from [facility]. CSW went to room and met with patient and caregiver. Patient was not involved with assessment and caregiver reports best contact person is dtr Insurance account manager).    CSW called dtr via phone. Dtr is currently out of town but reports she is trying to handle affairs while away. Patient has been at [facility] but has planned to move to [facility]. Dtr reports she has already paid and patient was originally planned to move on Monday but then had to be admitted. Dtr reports she feels it will be best to move patient directly from the hospital to [facility]. Dtr reports she will call and talk with [place] to ensure all affairs are in order and then call CSW so that information can be faxed to [place].    CSW will continue to follow.   Assessment/plan status:  Psychosocial Support/Ongoing Assessment of Needs Other assessment/ plan:   Information/referral to community resources:   Will DC to alternative ALF    PATIENT'S/FAMILY'S RESPONSE TO PLAN OF CARE: Patient unable to fully participate in assessment at this time. Dtr reports she feels overwhelmed with making so many  decisions while out of town but feels that she is making the best decision for patient. Dtr reports that patient is not completely happy with ALF placement but feels that since he has 24 hour caregivers that he is enjoying stay better. Dtr agreeable to keep CSW updated and thanked CSW for call.       Sindy Messing, LCSW  (Coverage for [employer])

## 2013-11-08 NOTE — Progress Notes (Signed)
UR Completed.  Jerry Kerr Jane [phone] 11/08/2013  

## 2013-11-08 NOTE — Progress Notes (Signed)
[facility] PROGRESS NOTE   Jerry Kerr WFU:[id number] DOB: [birth year] DOA: 11/07/2013 PCP: No primary provider on file.  HPI/Subjective: Seen with a caregiver from [facility] at bedside. Patient very disengaged, reporting that he does not want physical therapy or other interventions.   Assessment/Plan: Principal Problem:   Near syncope Active Problems:   COPD (chronic obstructive pulmonary disease)   Hypertension   Dementia   Near Syncope -Reported by his caregivers, patient fell without loss of consciousness. -Patient had recent fall, he had polyps problems. -Patient supposed to use walker at his ALF but he is not using it.  Dysphagia -Patient was cough and almost choking while he was eating. -Seen by SLP and recommended dysphagia 1 with nectar thick liquids. -SOB he mentioned there is might be oropharyngeal narrowing secondary to cervical hardware, CT of the neck to be obtained.  COPD -will continue with inhalers  -oxygen as needed   Hypertension -will continue with home medications  -Currently elevated   Dementia -continue with present home medications   Code Status: Full code Family Communication: Plan discussed with the patient. Disposition Plan: Remains inpatient   Consultants:  None  Procedures:  None  Antibiotics:  None   Objective: Filed Vitals:   11/08/13 1507  BP: 152/78  Pulse:   Temp:   Resp:     Intake/Output Summary (Last 24 hours) at 11/08/13 1701 Last data filed at 11/08/13 1500  Gross per 24 hour  Intake 716.33 ml  Output      0 ml  Net 716.33 ml   Filed Weights   11/07/13 2300  Weight: 66.724 kg (147 lb 1.6 oz)    Exam: General: Alert and awake, oriented x3, not in any acute distress. HEENT: anicteric sclera, pupils reactive to light and accommodation, EOMI CVS: S1-S2 clear, no murmur rubs or gallops Chest: clear to auscultation bilaterally, no wheezing, rales or rhonchi Abdomen: soft nontender,  nondistended, normal bowel sounds, no organomegaly Extremities: no cyanosis, clubbing or edema noted bilaterally Neuro: Cranial nerves II-XII intact, no focal neurological deficits  Data Reviewed: Basic Metabolic Panel:  Recent Labs Lab 11/07/13 2031 11/07/13 2048 11/08/13 0545  NA 137 138 135*  K 4.4 4.3 4.2  CL 98 101 98  CO2 24  --  25  GLUCOSE 90 89 109*  BUN 19 18 17   CREATININE 0.89 0.90 0.91  CALCIUM 10.1  --  9.3   Liver Function Tests:  Recent Labs Lab 11/07/13 2031 11/08/13 0545  AST 18 14  ALT 11 8  ALKPHOS 103 88  BILITOT 0.4 0.5  PROT 7.0 6.1  ALBUMIN 3.4* 3.0*   No results found for this basename: LIPASE, AMYLASE,  in the last 168 hours No results found for this basename: AMMONIA,  in the last 168 hours CBC:  Recent Labs Lab 11/07/13 2031 11/07/13 2048 11/08/13 0545  WBC 12.1*  --  10.9*  HGB 12.6* 13.6 11.4*  HCT 37.0* 40.0 34.8*  MCV 89.2  --  90.2  PLT 172  --  149*   Cardiac Enzymes:  Recent Labs Lab 11/08/13 0020 11/08/13 0545 11/08/13 1204  TROPONINI <0.30 <0.30 <0.30   BNP (last 3 results)  Recent Labs  11/07/13 2031  PROBNP 1584.0*   CBG:  Recent Labs Lab 11/07/13 2035  GLUCAP 91    Micro Recent Results (from the past 240 hour(s))  MRSA PCR SCREENING     Status: Abnormal   Collection Time    11/08/13  6:59 AM  Result Value Ref Range Status   MRSA by PCR POSITIVE (*) NEGATIVE Final   Comment:            The GeneXpert MRSA Assay (FDA     approved for NASAL specimens     only), is one component of a     comprehensive MRSA colonization     surveillance program. It is not     intended to diagnose MRSA     infection nor to guide or     monitor treatment for     MRSA infections.     RESULT CALLED TO, READ BACK BY AND VERIFIED WITH:     Jule Ser [id number] @ 0786 BY J SCOTTON     Studies: Dg Chest 2 View  11/07/2013   CLINICAL DATA:  Weakness.  History of fall.  EXAM: CHEST  2 VIEW  COMPARISON:   Chest x-ray 08/29/2013.  FINDINGS: Emphysematous changes again noted in the lungs bilaterally. No acute consolidative airspace disease. No pleural effusions. No evidence of pulmonary edema. Heart size is normal. The patient is rotated to the left on today's exam, resulting in distortion of the mediastinal contours and reduced diagnostic sensitivity and specificity for mediastinal pathology. Atherosclerosis in the thoracic aorta. Left-sided pacemaker device in place with lead tip projecting over the right atrium and right ventricular apex. Orthopedic fixation hardware in the lower cervical spine is incompletely visualized. Old fracture of the lateral aspect of the right seventh rib.  IMPRESSION: 1. No radiographic evidence of acute cardiopulmonary disease. 2. Emphysema. 3. Atherosclerosis.   Electronically Signed   By: Vinnie Langton M.D.   On: 11/07/2013 21:41   Ct Head Wo Contrast  11/07/2013   CLINICAL DATA:  Generalized weakness  EXAM: CT HEAD WITHOUT CONTRAST  TECHNIQUE: Contiguous axial images were obtained from the base of the skull through the vertex without intravenous contrast.  COMPARISON:  03/23/2012  FINDINGS: The brain shows chronic generalized atrophy. There chronic small-vessel changes of the deep white matter. No sign of acute infarction, mass lesion, hemorrhage, hydrocephalus or extra-axial collection. Sinuses, middle ears and mastoids are clear. No skull or skullbase lesion. There is atherosclerotic calcification of the major vessels at the base of the brain.  IMPRESSION: No acute finding. Chronic atrophy. Chronic small-vessel disease of the white matter.   Electronically Signed   By: Nelson Chimes M.D.   On: 11/07/2013 21:27   Dg Swallowing Func-speech Pathology  11/08/2013   Katherene Ponto Deblois, CCC-SLP     11/08/2013  3:39 PM Objective Swallowing Evaluation: Modified Barium Swallowing Study   Patient Details  Name: Jerry Kerr MRN: [id number] Date of Birth: July 05, 1933  Today's Date:  11/08/2013 Time: 1445-1510 SLP Time Calculation (min): 25 min  Past Medical History:  Past Medical History  Diagnosis Date  . Asthma   . Dementia   . Back pain   . COPD (chronic obstructive pulmonary disease)   . Hypertension   . Pacemaker   . Anxiety   . Depression   . Cancer     SKIN, PROSTATE  . GERD (gastroesophageal reflux disease)   . Substance abuse     ETOH   Past Surgical History:  Past Surgical History  Procedure Laterality Date  . Neck surgery    . Upper gastrointestinal endoscopy    . Colonoscopy    . Ankle surgery    . Skin graft    . Pacemaker insertion    . Tonsillectomy     HPI:  Jerry Kerr  Besecker is a 80 y.o. male with prior history of dementia  presents with an episode of possible near syncope. Pt has a  history of GERD. Was observed to have a choing episode today with  breakfast, this is a new problem per pt. 2008 Barium swallow  demonstrated Barretts esophagus, inflammation of distal  esophagus, significant delay of pill passing distal esophagus.      Assessment / Plan / Recommendation Clinical Impression  Dysphagia Diagnosis: Severe cervical esophageal phase  dysphagia;Severe pharyngeal phase dysphagia Clinical impression: Pt demonstrates a primary cervical  esophageal dysphagia due to cervical hardware impacting  oropharyngeal function. Pts oral function is sufficient. There is  cervical hardware extending from C4 to C7 pressing against pts  UES and impeding hyolaryngeal movement and epiglottic deflection.  Pt also with significant forward leaning posture. As pt transit  bolus to pharynx the airway cannot fully close and 50% of the  bolus is penetrated/aspirated with resulting hard cough. Pt does  expel  and swallow most aspirate with coughing and swallowing.  Nectar thick liquids and pureed textures result in min to  moderate penetration that pt can clear with cues to clear throat  and swallow again. Masticated solids and pills are likely to  lodge more signficantly in the vestibule with high risk  of  asphyxiation. Compensatory postures were not effective in  increasing airway protection. Recommend dys 1 (puree) diet and  nectar thick liquids though aspiration risk will still be  moderate to severe. Upright posture is essential.     Treatment Recommendation  F/U MBS in ___ days (Comment)    Diet Recommendation Dysphagia 1 (Puree);Nectar-thick liquid   Liquid Administration via: Cup Medication Administration: Crushed with puree Supervision: Full supervision/cueing for compensatory strategies Compensations: Slow rate;Small sips/bites;Multiple dry swallows  after each bite/sip;Clear throat after each swallow Postural Changes and/or Swallow Maneuvers: Seated upright 90  degrees;Upright 30-60 min after meal;Out of bed for meals    Other  Recommendations Recommended Consults: MBS Oral Care Recommendations: Oral care BID Other Recommendations: Order thickener from pharmacy   Follow Up Recommendations  Skilled Nursing facility    Frequency and Duration min 2x/week  2 weeks   Pertinent Vitals/Pain NA    SLP Swallow Goals     General HPI: Daune Colgate is a 80 y.o. male with prior history  of dementia presents with an episode of possible near syncope. Pt  has a history of GERD. Was observed to have a choing episode  today with breakfast, this is a new problem per pt. 2008 Barium  swallow demonstrated Barretts esophagus, inflammation of distal  esophagus, significant delay of pill passing distal esophagus.  Type of Study: Modified Barium Swallowing Study Reason for Referral: Objectively evaluate swallowing function Previous Swallow Assessment: see HPI Diet Prior to this Study: NPO Temperature Spikes Noted: No Respiratory Status: Room air History of Recent Intubation: No Behavior/Cognition: Alert;Cooperative Oral Cavity - Dentition: Poor condition Oral Motor / Sensory Function: Within functional limits Self-Feeding Abilities: Able to feed self Patient Positioning: Upright in bed Baseline Vocal Quality: Wet Volitional  Cough: Strong Volitional Swallow: Able to elicit Anatomy: Other (Comment) (Posterior and anterior cervical  hardware from C4-7) Pharyngeal Secretions: Not observed secondary MBS    Reason for Referral Objectively evaluate swallowing function   Oral Phase Oral Preparation/Oral Phase Oral Phase: WFL   Pharyngeal Phase Pharyngeal Phase Pharyngeal Phase: Impaired Pharyngeal - Nectar Pharyngeal - Nectar Teaspoon: Reduced epiglottic  inversion;Reduced airway/laryngeal closure;Reduced anterior  laryngeal mobility;Penetration/Aspiration during  swallow;Penetration/Aspiration after  swallow;Pharyngeal residue -  valleculae Penetration/Aspiration details (nectar teaspoon): Material enters  airway, remains ABOVE vocal cords then ejected out;Material  enters airway, remains ABOVE vocal cords and not ejected out Pharyngeal - Nectar Cup: Reduced epiglottic inversion;Reduced  airway/laryngeal closure;Reduced anterior laryngeal  mobility;Penetration/Aspiration during  swallow;Penetration/Aspiration after swallow;Pharyngeal residue -  valleculae Penetration/Aspiration details (nectar cup): Material enters  airway, remains ABOVE vocal cords then ejected out;Material  enters airway, remains ABOVE vocal cords and not ejected  out;Material does not enter airway Pharyngeal - Nectar Straw: Not tested Pharyngeal - Thin Pharyngeal - Thin Cup: Reduced epiglottic inversion;Reduced  airway/laryngeal closure;Reduced anterior laryngeal  mobility;Penetration/Aspiration during  swallow;Penetration/Aspiration after swallow;Pharyngeal residue -  valleculae;Significant aspiration (Amount) Penetration/Aspiration details (thin cup): Material enters  airway, passes BELOW cords then ejected out;Material enters  airway, CONTACTS cords then ejected out;Material enters airway,  passes BELOW cords and not ejected out despite cough attempt by  patient Pharyngeal - Thin Straw: Reduced epiglottic inversion;Reduced  airway/laryngeal closure;Reduced anterior  laryngeal  mobility;Penetration/Aspiration during  swallow;Penetration/Aspiration after swallow;Pharyngeal residue -  valleculae;Significant aspiration (Amount) (head turn) Penetration/Aspiration details (thin straw): Material enters  airway, CONTACTS cords then ejected out;Material enters airway,  passes BELOW cords then ejected out;Material enters airway,  passes BELOW cords and not ejected out despite cough attempt by  patient Pharyngeal - Solids Pharyngeal - Puree: Reduced epiglottic inversion;Reduced  airway/laryngeal closure;Reduced anterior laryngeal  mobility;Pharyngeal residue - valleculae Penetration/Aspiration details (puree): Material does not enter  airway Pharyngeal - Regular: Reduced epiglottic inversion;Reduced  airway/laryngeal closure;Reduced anterior laryngeal  mobility;Pharyngeal residue - valleculae;Penetration/Aspiration  after swallow Penetration/Aspiration details (regular): Material enters airway,  remains ABOVE vocal cords then ejected out  Cervical Esophageal Phase    GO    Cervical Esophageal Phase Cervical Esophageal Phase: Impaired (See impression statement)    Functional Assessment Tool Used: clinical judgement Functional Limitations: Swallowing Swallow Current Status (N1700): At least 40 percent but less than  60 percent impaired, limited or restricted Swallow Goal Status [phone]): At least 20 percent but less than 40  percent impaired, limited or restricted   [facility], [place] CCC-SLP [phone]   Lynann Beaver 11/08/2013, 3:37 PM     Scheduled Meds: . acidophilus  1 capsule Oral Daily  . aspirin  81 mg Oral Daily  . divalproex  500 mg Oral BID  . docusate sodium  100 mg Oral BID  . folic acid  1 mg Oral Daily  . heparin  5,000 Units Subcutaneous 3 times per day  . lisinopril  5 mg Oral Daily  . memantine  10 mg Oral BID  . metoprolol  50 mg Oral BID  . multivitamin with minerals  1 tablet Oral Daily  . pantoprazole  40 mg Oral Daily  . QUEtiapine  50 mg Oral  QHS  . sertraline  50 mg Oral Daily  . sodium chloride  3 mL Intravenous Q12H  . thiamine  100 mg Oral Daily  . tiotropium  18 mcg Inhalation Daily  . traZODone  50 mg Oral QHS   Continuous Infusions: . sodium chloride 50 mL/hr at 11/08/13 0044       Time spent: 35 minutes    [facility]  [facility] Pager [phone] If 7PM-7AM, please contact night-coverage at [email or web address], password [facility] 11/08/2013, 5:01 PM  LOS: 1 day

## 2013-11-08 NOTE — Progress Notes (Signed)
*  PRELIMINARY RESULTS* Vascular Ultrasound Carotid Duplex (Doppler) has been completed.   Findings suggest 1-39% internal carotid artery stenosis bilaterally. Vertebral arteries are patent with antegrade flow.  11/08/2013 10:14 AM Maudry Mayhew, RVT, RDCS, RDMS

## 2013-11-08 NOTE — Evaluation (Signed)
Clinical/Bedside Swallow Evaluation Patient Details  Name: Jerry Kerr MRN: [id number] Date of Birth: 1933/09/22  Today's Date: 11/08/2013 Time: 1237-1300 SLP Time Calculation (min): 23 min  Past Medical History:  Past Medical History  Diagnosis Date  . Asthma   . Dementia   . Back pain   . COPD (chronic obstructive pulmonary disease)   . Hypertension   . Pacemaker   . Anxiety   . Depression   . Cancer     SKIN, PROSTATE  . GERD (gastroesophageal reflux disease)   . Substance abuse     ETOH   Past Surgical History:  Past Surgical History  Procedure Laterality Date  . Neck surgery    . Upper gastrointestinal endoscopy    . Colonoscopy    . Ankle surgery    . Skin graft    . Pacemaker insertion    . Tonsillectomy     HPI:  Jerry Kerr is a 80 y.o. male with prior history of dementia presents with an episode of possible near syncope. Pt has a history of GERD. Was observed to have a choing episode today with breakfast, this is a new problem per pt. 2008 Barium swallow demonstrated Barretts esophagus, inflammation of distal esophagus, significant delay of pill passing distal esophagus.    Assessment / Plan / Recommendation Clinical Impression  Pt presents with direct evidence of an oropharygneal or perhaps cervical esophageal dysphagia. Pt has wet vocal quality, throat clearing and multiple swallows with all trials. Pt also reports discomfort with swallowing and also fear fololwing episode today when he reportedly could not breathe after swallowing a bite of eggs. Though pt reprots this is a new problem, suspect it may be an acutely worsened baseline dysphagia. Recommend objective evaluation of swallowing prior to further POs being given. Pt could continue with meds crushed in puree if needed. Will attempt MBS today or tomorrow.     Aspiration Risk  Moderate    Diet Recommendation NPO;NPO except meds   Medication Administration: Crushed with puree Postural Changes  and/or Swallow Maneuvers: Seated upright 90 degrees    Other  Recommendations Recommended Consults: MBS Oral Care Recommendations: Oral care BID   Follow Up Recommendations  Skilled Nursing facility    Frequency and Duration        Pertinent Vitals/Pain NA    SLP Swallow Goals     Swallow Study Prior Functional Status       General HPI: Jerry Kerr is a 80 y.o. male with prior history of dementia presents with an episode of possible near syncope. Pt has a history of GERD. Was observed to have a choing episode today with breakfast, this is a new problem per pt. 2008 Barium swallow demonstrated Barretts esophagus, inflammation of distal esophagus, significant delay of pill passing distal esophagus.  Type of Study: Bedside swallow evaluation Diet Prior to this Study: Regular;Thin liquids Temperature Spikes Noted: No Respiratory Status: Room air History of Recent Intubation: No Behavior/Cognition: Alert;Cooperative Oral Cavity - Dentition: Poor condition Self-Feeding Abilities: Able to feed self Patient Positioning: Upright in bed Baseline Vocal Quality: Clear Volitional Cough: Strong Volitional Swallow: Able to elicit    Oral/Motor/Sensory Function Overall Oral Motor/Sensory Function: Appears within functional limits for tasks assessed   Ice Chips     Thin Liquid Thin Liquid: Impaired Presentation: Cup;Self Fed Pharyngeal  Phase Impairments: Decreased hyoid-laryngeal movement;Multiple swallows;Wet Vocal Quality;Throat Clearing - Immediate;Cough - Immediate    Nectar Thick Nectar Thick Liquid: Not tested   Honey Thick Honey Thick  Liquid: Not tested   Puree Puree: Impaired Presentation: Spoon;Self Fed Pharyngeal Phase Impairments: Decreased hyoid-laryngeal movement;Multiple swallows;Wet Vocal Quality;Throat Clearing - Immediate   Solid   GO Functional Assessment Tool Used: clinical judgement Functional Limitations: Swallowing Swallow Current Status (L9767): At least  40 percent but less than 60 percent impaired, limited or restricted Swallow Goal Status [phone]): At least 20 percent but less than 40 percent impaired, limited or restricted  Solid: Impaired Presentation: Self Fed Pharyngeal Phase Impairments: Decreased hyoid-laryngeal movement;Multiple swallows;Throat Clearing - Immediate      Herbie Baltimore, MA CCC-SLP [phone]  Lynann Beaver 11/08/2013,1:32 PM

## 2013-11-08 NOTE — Care Management Note (Addendum)
    Page 1 of 1   11/09/2013     1:07:29 PM CARE MANAGEMENT NOTE 11/09/2013  Patient:  Jerry Kerr, Jerry Kerr   Account Number:  [id number]  Date Initiated:  11/08/2013  Documentation initiated by:  Dessa Phi  Subjective/Objective Assessment:   80 Y/O M ADMITTED [facility].     Action/Plan:   FROM ALF.   Anticipated DC Date:  11/09/2013   Anticipated DC Plan:  [place]  CM consult      Choice offered to / List presented to:  C-4 Adult Children        HH arranged  HH-2 PT      [place]   Status of service:  Completed, signed off Medicare Important Message given?  NA - LOS <3 / Initial given by admissions (If response is "NO", the following Medicare IM given date fields will be blank) Date Medicare IM given:   Medicare IM given by:   Date Additional Medicare IM given:   Additional Medicare IM given by:    Discharge Disposition:  [place]  Per UR Regulation:  Reviewed for med. necessity/level of care/duration of stay  If discussed at [place] of Stay Meetings, dates discussed:    Comments:  11/09/13 Mardelle Pandolfi RN,BSN NCM [phone] 1:10P-HHPT ORDER PLACED.[facility] REP DEBBIE AWARE.  PT-HH.SPOKE TO [facility]TR [facility] WHOM [place] RECOMMENDS.TC [place] SPOKE TO [place] RECOMMENDS [facility].TC DEBBIE @ [facility] AWARE OF REFERRAL AWAIT HHPT ORDER.  11/08/13 Kayler Buckholtz RN,BSN NCM [phone] AWAIT PT RECOMMENDATIONS.

## 2013-11-09 DIAGNOSIS — I10 Essential (primary) hypertension: Secondary | ICD-10-CM

## 2013-11-09 DIAGNOSIS — R55 Syncope and collapse: Secondary | ICD-10-CM | POA: Diagnosis not present

## 2013-11-09 MED ORDER — MUPIROCIN 2 % EX OINT
1.0000 "application " | TOPICAL_OINTMENT | Freq: Two times a day (BID) | CUTANEOUS | Status: DC
  Administered 2013-11-09: 1 via NASAL
  Filled 2013-11-09: qty 22

## 2013-11-09 MED ORDER — TRAMADOL HCL 50 MG PO TABS: 50.0000 mg | ORAL_TABLET | Freq: Four times a day (QID) | ORAL | Status: DC | PRN

## 2013-11-09 MED ORDER — CHLORHEXIDINE GLUCONATE CLOTH 2 % EX PADS
6.0000 | MEDICATED_PAD | Freq: Every day | CUTANEOUS | Status: DC
  Administered 2013-11-09: 6 via TOPICAL

## 2013-11-09 NOTE — Progress Notes (Signed)
CSW called & spoke with patient's daughter, Jerry Kerr to confirm discharge plans - daughter has made arrangements for patient to go to [facility]. Daughter states that they have arranged for him to have 24 hour caregivers through [facility]. Daughter states that they have also arranged for a caregiver to provide transportation to [place], though they may not be able to pick patient up until 5pm as they are moving his belongings from [facility] to [place] [facility] today.   Dr. Darrick Meigs & RNCM, Juliann Pulse aware.   No further CSW needs identified - CSW signing off.   Raynaldo Opitz, [facility] Clinical Social Worker cell #: [phone]

## 2013-11-09 NOTE — Progress Notes (Signed)
Speech Language Pathology Treatment: Dysphagia  Patient Details Name: Jerry Kerr MRN: [id number] DOB: April 15, 1933 Today's Date: 11/09/2013 Time: 4765-4650 SLP Time Calculation (min): 22 min  Assessment / Plan / Recommendation Clinical Impression  Skilled follow up intervention after MBS yesterday with pt. And caregivers from private agency.  Pt. With obvious dementia and was unable to recall results/recommedations from study.  Pt. demonstrated compensatory strategies for caregivers (there will be various caregivers) with SLP providing max verbal and visual cues for small sips, cue to cough and swallow again following initial swallow.  Explained that currently pt. Is intermittently penetrating/aspirating even with nectar liquids and the importance of being cued to perform compensatory techniques.  Pt. Is ambulatory which is beneficial for pulmonary status, however aspiration risk will be higher if he becomes immobile.   Caregiver reported pt. Appeared to have significant difficulty with puree texture and eventually quit eating.  SLP recommends home health at [facility] to facilitate optimal solid consistency.  Discussion with family re: swallow status and impact on future health will be very beneficial to prepare for likely progression of dysphagia.   HPI HPI: Jerry Kerr is a 80 y.o. male with prior history of dementia presents with an episode of possible near syncope. Pt has a history of GERD. Was observed to have a choing episode today with breakfast, this is a new problem per pt. 2008 Barium swallow demonstrated Barretts esophagus, inflammation of distal esophagus, significant delay of pill passing distal esophagus.    Pertinent Vitals Pain Assessment: No/denies pain  SLP Plan  Continue with current plan of care    Recommendations Diet recommendations: Dysphagia 1 (puree);Nectar-thick liquid Liquids provided via: Cup;No straw Medication Administration: Crushed with  puree Supervision: Full supervision/cueing for compensatory strategies Compensations: Slow rate;Small sips/bites;Multiple dry swallows after each bite/sip;Clear throat after each swallow Postural Changes and/or Swallow Maneuvers: Seated upright 90 degrees;Upright 30-60 min after meal;Out of bed for meals              Oral Care Recommendations: Oral care BID Follow up Recommendations: Home health SLP Plan: Continue with current plan of care    GO Functional Assessment Tool Used: clinical judgement Functional Limitations: Swallowing Swallow Current Status (P5465): At least 40 percent but less than 60 percent impaired, limited or restricted Swallow Goal Status [phone]): At least 20 percent but less than 40 percent impaired, limited or restricted Swallow Discharge Status [phone]): At least 40 percent but less than 60 percent impaired, limited or restricted   [name] 11/09/2013, 2:58 PM [phone]

## 2013-11-09 NOTE — Progress Notes (Signed)
OT Cancellation Note  Patient Details Name: Jerry Kerr MRN: [id number] DOB: 1933-11-17   Cancelled Treatment:    Reason Eval/Treat Not Completed: Other (comment).  Spoke to caregiver.  Pt is from [facility] and is being transferred to [facility] with 24/7 caregivers.  Pt normally does his own adls.  He walked to bathroom earlier with nursing staff but was unsteady. Meal tray just arrived and pt will need extra time to eat.  If schedule permits, we will check back later today  [facility] 11/09/2013, 10:34 AM Lesle Chris, OTR/L [phone] 11/09/2013

## 2013-11-09 NOTE — Progress Notes (Signed)
CSW faxed discharge summary to Dr. Fredderick Phenix per daughter, Kitty's request.   Raynaldo Opitz, [place] Worker cell #: [phone]

## 2013-11-09 NOTE — Discharge Summary (Signed)
Physician Discharge Summary  Phu Record VHQ:[id number] DOB: Feb 06, 1934 DOA: 11/07/2013  PCP: No primary provider on file.  Admit date: 11/07/2013 Discharge date: 11/09/2013  Time spent: *50 minutes  Recommendations for Outpatient Follow-up:  1. *Follow up PCP in 2 weeks  Discharge Diagnoses:  Principal Problem:   Near syncope Active Problems:   COPD (chronic obstructive pulmonary disease)   Hypertension   Dementia   Discharge Condition: *Stable  Diet recommendation: Low salt diet  Filed Weights   11/07/13 2300  Weight: 66.724 kg (147 lb 1.6 oz)    History of present illness:  80 y.o. male with prior history of dementia presents with an episode of possible near syncope. Apparently the patient resides in a assisted living community and was taking a stroll outside. He states that next thing he knew he had fallen. He did not hit his head. He did not lose consciousness. Patient states he had no chest pain. He had no injury to his hip or extremities. Patient states that he has no headaches. His companion who is with him states that he thinks he just tripped. Patient has a prior history of cardiac disease and is therefore being admitted for further workup.      Hospital Course:   Near syncope Patient presented with near-syncope, but never passed out. Cardiac enzymes were checked which were negative x3. Acute coronary syndrome or cardiac ischemia was ruled out. Patient was seen by physical therapy, and had gait problems. He will be discharged to assisted living facility with home health PT. Patient has 24-hour caregivers him. Called and spoke to the daughter, who has found a different assisted living facility for the patient.   Will discharge the patient to [place] with home health PT.  Dysphagia Patient was seen by AP and started on dysphagia 1 diet with thick liquids  COPD Continue with inhalers  Hypertension Continue her medications  Dementia Stable Continue on  medications  Procedures:  None  Consultations:  None  Discharge Exam: Filed Vitals:   11/08/13 2152  BP: 138/68  Pulse: 60  Temp: 98.5 F (36.9 C)  Resp: 18    General: Appears in no acute distress* Cardiovascular: *S1-S2 regular Respiratory: Clear bilaterally  Discharge Instructions You were cared for by a hospitalist during your hospital stay. If you have any questions about your discharge medications or the care you received while you were in the hospital after you are discharged, you can call the unit and asked to speak with the hospitalist on call if the hospitalist that took care of you is not available. Once you are discharged, your primary care physician will handle any further medical issues. Please note that NO REFILLS for any discharge medications will be authorized once you are discharged, as it is imperative that you return to your primary care physician (or establish a relationship with a primary care physician if you do not have one) for your aftercare needs so that they can reassess your need for medications and monitor your lab values.  Discharge Instructions   Diet - low sodium heart healthy    Complete by:  As directed      Increase activity slowly    Complete by:  As directed             Medication List         acetaminophen 500 MG tablet  Commonly known as:  TYLENOL  Take 500 mg by mouth every 6 (six) hours as needed.     aspirin  81 MG tablet  Take 81 mg by mouth daily.     CULTURELLE KIDS Pack  Take 1 packet by mouth daily.     divalproex 125 MG capsule  Commonly known as:  DEPAKOTE SPRINKLE  Take 500 mg by mouth 2 (two) times daily.     lisinopril 5 MG tablet  Commonly known as:  PRINIVIL,ZESTRIL  Take 5 mg by mouth daily.     loperamide 2 MG capsule  Commonly known as:  IMODIUM  Take 2 mg by mouth as needed for diarrhea or loose stools.     LORazepam 1 MG tablet  Commonly known as:  ATIVAN  Take 1 mg by mouth See admin  instructions. Take 1 tab by mouth every 6 hours as needed for anxiety and 1 tablet at bedtime     LORazepam 0.5 MG tablet  Commonly known as:  ATIVAN  Take 0.5 mg by mouth every 8 (eight) hours.     MAALOX ADVANCED 200-200-20 MG/5ML suspension  Generic drug:  alum & mag hydroxide-simeth  Take 30 mLs by mouth every 6 (six) hours as needed for indigestion or heartburn.     memantine 10 MG tablet  Commonly known as:  NAMENDA  Take 10 mg by mouth 2 (two) times daily.     metoprolol 50 MG tablet  Commonly known as:  LOPRESSOR  Take 50 mg by mouth 2 (two) times daily.     omeprazole 20 MG capsule  Commonly known as:  PRILOSEC  Take 20 mg by mouth daily.     potassium chloride 20 MEQ/15ML (10%) solution  Take 20 mEq by mouth daily.     PROAIR HFA 108 (90 BASE) MCG/ACT inhaler  Generic drug:  albuterol  Inhale 2 puffs into the lungs every 6 (six) hours as needed for wheezing or shortness of breath.     SEROQUEL 50 MG tablet  Generic drug:  QUEtiapine  Take 50 mg by mouth at bedtime.     sertraline 50 MG tablet  Commonly known as:  ZOLOFT  Take 50 mg by mouth daily.     tiotropium 18 MCG inhalation capsule  Commonly known as:  SPIRIVA  Place 18 mcg into inhaler and inhale daily.     traMADol 50 MG tablet  Commonly known as:  ULTRAM  Take 1 tablet (50 mg total) by mouth every 6 (six) hours as needed.     traZODone 50 MG tablet  Commonly known as:  DESYREL  Take 50 mg by mouth at bedtime.       No Known Allergies    The results of significant diagnostics from this hospitalization (including imaging, microbiology, ancillary and laboratory) are listed below for reference.    Significant Diagnostic Studies: Dg Chest 2 View  11/07/2013   CLINICAL DATA:  Weakness.  History of fall.  EXAM: CHEST  2 VIEW  COMPARISON:  Chest x-ray 08/29/2013.  FINDINGS: Emphysematous changes again noted in the lungs bilaterally. No acute consolidative airspace disease. No pleural effusions. No  evidence of pulmonary edema. Heart size is normal. The patient is rotated to the left on today's exam, resulting in distortion of the mediastinal contours and reduced diagnostic sensitivity and specificity for mediastinal pathology. Atherosclerosis in the thoracic aorta. Left-sided pacemaker device in place with lead tip projecting over the right atrium and right ventricular apex. Orthopedic fixation hardware in the lower cervical spine is incompletely visualized. Old fracture of the lateral aspect of the right seventh rib.  IMPRESSION: 1. No  radiographic evidence of acute cardiopulmonary disease. 2. Emphysema. 3. Atherosclerosis.   Electronically Signed   By: Vinnie Langton M.D.   On: 11/07/2013 21:41   Ct Head Wo Contrast  11/07/2013   CLINICAL DATA:  Generalized weakness  EXAM: CT HEAD WITHOUT CONTRAST  TECHNIQUE: Contiguous axial images were obtained from the base of the skull through the vertex without intravenous contrast.  COMPARISON:  03/23/2012  FINDINGS: The brain shows chronic generalized atrophy. There chronic small-vessel changes of the deep white matter. No sign of acute infarction, mass lesion, hemorrhage, hydrocephalus or extra-axial collection. Sinuses, middle ears and mastoids are clear. No skull or skullbase lesion. There is atherosclerotic calcification of the major vessels at the base of the brain.  IMPRESSION: No acute finding. Chronic atrophy. Chronic small-vessel disease of the white matter.   Electronically Signed   By: Nelson Chimes M.D.   On: 11/07/2013 21:27   Ct Soft Tissue Neck W Contrast  11/08/2013   CLINICAL DATA:  Increasing dysphagia and choking sensation with history of C4-C7 fusion and tonsillectomy ; clinical impression from the speech therapist's is There is cervical esophageal dysphagia due to cervical hardware impacting the oropharyngeal function  EXAM: CT NECK WITH CONTRAST  TECHNIQUE: Multidetector CT imaging of the neck was performed using the standard protocol  following the bolus administration of intravenous contrast.  CONTRAST:  173mL OMNIPAQUE IOHEXOL 300 MG/ML  SOLN  COMPARISON:  None.  FINDINGS: The patient has undergone previous anterior cervical fusion from C3. Through C5. Posterior fusion has occurred at these levels with a pedicle screw place through C7 with connecting rods to the levels above. The metallic hardware lies approximately 2 mm deep to the posterior lower hypopharynx shield -upper esophageal walls. There is no evidence of perforation. The laryngeal structures and the epiglottis appear normal. The airway is patent. There is no evidence of abscess or diverticulum formation. The observed portions of the thyroid gland are unremarkable.  More superiorly within the neck the parotid and submandibular glands are normal. The jugular and carotid vessels exhibit no acute abnormalities. There is no lymphadenopathy.  The pulmonary apices are clear.  IMPRESSION: 1. The anterior fusion plate extending from C3-C5 is immediately deep to the lower hypopharyngeal-upper esophageal wall and may be impacting the muscular function as suspected clinically. There is no evidence of perforation or abscess formation. 2. There is no acute abnormality within the neck.   Electronically Signed   By: David  Martinique   On: 11/08/2013 19:37   Dg Swallowing Func-speech Pathology  11/08/2013   Katherene Ponto Deblois, [place]     11/08/2013  3:39 PM Objective Swallowing Evaluation: Modified Barium Swallowing Study   Patient Details  Name: Romelle Reiley MRN: [id number] Date of Birth: 07-05-1933  Today's Date: 11/08/2013 Time: 1445-1510 SLP Time Calculation (min): 25 min  Past Medical History:  Past Medical History  Diagnosis Date  . Asthma   . Dementia   . Back pain   . COPD (chronic obstructive pulmonary disease)   . Hypertension   . Pacemaker   . Anxiety   . Depression   . Cancer     SKIN, PROSTATE  . GERD (gastroesophageal reflux disease)   . Substance abuse     ETOH   Past Surgical  History:  Past Surgical History  Procedure Laterality Date  . Neck surgery    . Upper gastrointestinal endoscopy    . Colonoscopy    . Ankle surgery    . Skin graft    .  Pacemaker insertion    . Tonsillectomy     HPI:  Kele Barthelemy is a 80 y.o. male with prior history of dementia  presents with an episode of possible near syncope. Pt has a  history of GERD. Was observed to have a choing episode today with  breakfast, this is a new problem per pt. 2008 Barium swallow  demonstrated Barretts esophagus, inflammation of distal  esophagus, significant delay of pill passing distal esophagus.      Assessment / Plan / Recommendation Clinical Impression  Dysphagia Diagnosis: Severe cervical esophageal phase  dysphagia;Severe pharyngeal phase dysphagia Clinical impression: Pt demonstrates a primary cervical  esophageal dysphagia due to cervical hardware impacting  oropharyngeal function. Pts oral function is sufficient. There is  cervical hardware extending from C4 to C7 pressing against pts  UES and impeding hyolaryngeal movement and epiglottic deflection.  Pt also with significant forward leaning posture. As pt transit  bolus to pharynx the airway cannot fully close and 50% of the  bolus is penetrated/aspirated with resulting hard cough. Pt does  expel  and swallow most aspirate with coughing and swallowing.  Nectar thick liquids and pureed textures result in min to  moderate penetration that pt can clear with cues to clear throat  and swallow again. Masticated solids and pills are likely to  lodge more signficantly in the vestibule with high risk of  asphyxiation. Compensatory postures were not effective in  increasing airway protection. Recommend dys 1 (puree) diet and  nectar thick liquids though aspiration risk will still be  moderate to severe. Upright posture is essential.     Treatment Recommendation  F/U MBS in ___ days (Comment)    Diet Recommendation Dysphagia 1 (Puree);Nectar-thick liquid   Liquid Administration  via: Cup Medication Administration: Crushed with puree Supervision: Full supervision/cueing for compensatory strategies Compensations: Slow rate;Small sips/bites;Multiple dry swallows  after each bite/sip;Clear throat after each swallow Postural Changes and/or Swallow Maneuvers: Seated upright 90  degrees;Upright 30-60 min after meal;Out of bed for meals    Other  Recommendations Recommended Consults: MBS Oral Care Recommendations: Oral care BID Other Recommendations: Order thickener from pharmacy   Follow Up Recommendations  Skilled Nursing facility    Frequency and Duration min 2x/week  2 weeks   Pertinent Vitals/Pain NA    SLP Swallow Goals     General HPI: Atilano Covelli is a 80 y.o. male with prior history  of dementia presents with an episode of possible near syncope. Pt  has a history of GERD. Was observed to have a choing episode  today with breakfast, this is a new problem per pt. 2008 Barium  swallow demonstrated Barretts esophagus, inflammation of distal  esophagus, significant delay of pill passing distal esophagus.  Type of Study: Modified Barium Swallowing Study Reason for Referral: Objectively evaluate swallowing function Previous Swallow Assessment: see HPI Diet Prior to this Study: NPO Temperature Spikes Noted: No Respiratory Status: Room air History of Recent Intubation: No Behavior/Cognition: Alert;Cooperative Oral Cavity - Dentition: Poor condition Oral Motor / Sensory Function: Within functional limits Self-Feeding Abilities: Able to feed self Patient Positioning: Upright in bed Baseline Vocal Quality: Wet Volitional Cough: Strong Volitional Swallow: Able to elicit Anatomy: Other (Comment) (Posterior and anterior cervical  hardware from C4-7) Pharyngeal Secretions: Not observed secondary MBS    Reason for Referral Objectively evaluate swallowing function   Oral Phase Oral Preparation/Oral Phase Oral Phase: WFL   Pharyngeal Phase Pharyngeal Phase Pharyngeal Phase: Impaired Pharyngeal - Nectar  Pharyngeal - Nectar Teaspoon:  Reduced epiglottic  inversion;Reduced airway/laryngeal closure;Reduced anterior  laryngeal mobility;Penetration/Aspiration during  swallow;Penetration/Aspiration after swallow;Pharyngeal residue -  valleculae Penetration/Aspiration details (nectar teaspoon): Material enters  airway, remains ABOVE vocal cords then ejected out;Material  enters airway, remains ABOVE vocal cords and not ejected out Pharyngeal - Nectar Cup: Reduced epiglottic inversion;Reduced  airway/laryngeal closure;Reduced anterior laryngeal  mobility;Penetration/Aspiration during  swallow;Penetration/Aspiration after swallow;Pharyngeal residue -  valleculae Penetration/Aspiration details (nectar cup): Material enters  airway, remains ABOVE vocal cords then ejected out;Material  enters airway, remains ABOVE vocal cords and not ejected  out;Material does not enter airway Pharyngeal - Nectar Straw: Not tested Pharyngeal - Thin Pharyngeal - Thin Cup: Reduced epiglottic inversion;Reduced  airway/laryngeal closure;Reduced anterior laryngeal  mobility;Penetration/Aspiration during  swallow;Penetration/Aspiration after swallow;Pharyngeal residue -  valleculae;Significant aspiration (Amount) Penetration/Aspiration details (thin cup): Material enters  airway, passes BELOW cords then ejected out;Material enters  airway, CONTACTS cords then ejected out;Material enters airway,  passes BELOW cords and not ejected out despite cough attempt by  patient Pharyngeal - Thin Straw: Reduced epiglottic inversion;Reduced  airway/laryngeal closure;Reduced anterior laryngeal  mobility;Penetration/Aspiration during  swallow;Penetration/Aspiration after swallow;Pharyngeal residue -  valleculae;Significant aspiration (Amount) (head turn) Penetration/Aspiration details (thin straw): Material enters  airway, CONTACTS cords then ejected out;Material enters airway,  passes BELOW cords then ejected out;Material enters airway,  passes BELOW cords and not  ejected out despite cough attempt by  patient Pharyngeal - Solids Pharyngeal - Puree: Reduced epiglottic inversion;Reduced  airway/laryngeal closure;Reduced anterior laryngeal  mobility;Pharyngeal residue - valleculae Penetration/Aspiration details (puree): Material does not enter  airway Pharyngeal - Regular: Reduced epiglottic inversion;Reduced  airway/laryngeal closure;Reduced anterior laryngeal  mobility;Pharyngeal residue - valleculae;Penetration/Aspiration  after swallow Penetration/Aspiration details (regular): Material enters airway,  remains ABOVE vocal cords then ejected out  Cervical Esophageal Phase    GO    Cervical Esophageal Phase Cervical Esophageal Phase: Impaired (See impression statement)    Functional Assessment Tool Used: clinical judgement Functional Limitations: Swallowing Swallow Current Status ([id number]): At least 40 percent but less than  60 percent impaired, limited or restricted Swallow Goal Status [phone]): At least 20 percent but less than 40  percent impaired, limited or restricted   Herbie Baltimore, [place] [phone]   Lynann Beaver 11/08/2013, 3:37 PM     Microbiology: Recent Results (from the past 240 hour(s))  MRSA PCR SCREENING     Status: Abnormal   Collection Time    11/08/13  6:59 AM      Result Value Ref Range Status   MRSA by PCR POSITIVE (*) NEGATIVE Final   Comment:            The GeneXpert MRSA Assay (FDA     approved for NASAL specimens     only), is one component of a     comprehensive MRSA colonization     surveillance program. It is not     intended to diagnose MRSA     infection nor to guide or     monitor treatment for     MRSA infections.     RESULT CALLED TO, READ BACK BY AND VERIFIED WITH:     Jule Ser [id number] @ [id number] BY J SCOTTON     Labs: Basic Metabolic Panel:  Recent Labs Lab 11/07/13 2031 11/07/13 2048 11/08/13 0545  NA 137 138 135*  K 4.4 4.3 4.2  CL 98 101 98  CO2 24  --  25  GLUCOSE 90 89 109*  BUN 19 18  17   CREATININE 0.89 0.90 0.91  CALCIUM  10.1  --  9.3   Liver Function Tests:  Recent Labs Lab 11/07/13 2031 11/08/13 0545  AST 18 14  ALT 11 8  ALKPHOS 103 88  BILITOT 0.4 0.5  PROT 7.0 6.1  ALBUMIN 3.4* 3.0*   No results found for this basename: LIPASE, AMYLASE,  in the last 168 hours No results found for this basename: AMMONIA,  in the last 168 hours CBC:  Recent Labs Lab 11/07/13 2031 11/07/13 2048 11/08/13 0545  WBC 12.1*  --  10.9*  HGB 12.6* 13.6 11.4*  HCT 37.0* 40.0 34.8*  MCV 89.2  --  90.2  PLT 172  --  149*   Cardiac Enzymes:  Recent Labs Lab 11/08/13 0020 11/08/13 0545 11/08/13 1204  TROPONINI <0.30 <0.30 <0.30   BNP: BNP (last 3 results)  Recent Labs  11/07/13 2031  PROBNP 1584.0*   CBG:  Recent Labs Lab 11/07/13 2035  GLUCAP 91       Signed:  Luisana Lutzke S  [facility] 11/09/2013, 12:54 PM

## 2013-11-09 NOTE — Progress Notes (Signed)
UR Completed Levie Owensby Graves-Bigelow, RN,BSN [phone]  

## 2013-11-09 NOTE — Evaluation (Signed)
Physical Therapy One Time Evaluation Patient Details Name: Jerry Kerr MRN: [id number] DOB: [birth year] Today's Date: 11/09/2013   History of Present Illness  Pt is a 80 year old male admitted 8/10 for near syncope and dysphagia with hx of dementia, COPD, pacemaker  Clinical Impression  Patient evaluated by Physical Therapy with no further acute PT needs identified. All education has been completed and the patient has no further questions. Pt very unsteady with ambulation, declined using RW and instead utilized hand rail to steady himself.  Recommended pt use RW or SPC to assist with safety during mobility however he did not respond. Recommend f/u HHPT if pt willing to participate.  Pt to d/c to [facility] with 24/7 caregivers. PT is signing off. Thank you for this referral.     Follow Up Recommendations Home health PT;Supervision for mobility/OOB    Equipment Recommendations  Rolling walker with 5" wheels    Recommendations for Other Services       Precautions / Restrictions Precautions Precautions: Fall      Mobility  Bed Mobility Overal bed mobility: Modified Independent                Transfers Overall transfer level: Needs assistance   Transfers: Sit to/from Stand Sit to Stand: Min guard         General transfer comment: verbal cues for IV line  Ambulation/Gait Ambulation/Gait assistance: Min guard Ambulation Distance (Feet): 80 Feet Assistive device: None Gait Pattern/deviations: Step-through pattern;Wide base of support     General Gait Details: very unsteady, min/guard for safety, declined RW, used hand rail in hallway; recommended pt use RW or SPC for safety; pt did not acknowlege comment however caregiver present and heard  Stairs            Wheelchair Mobility    Modified Rankin (Stroke Patients Only)       Balance Overall balance assessment: History of Falls                                           Pertinent  Vitals/Pain Pain Assessment: No/denies pain    Home Living Family/patient expects to be discharged to:: Private residence   Available Help at Discharge: Personal care attendant;Available 24 hours/day Type of Home: [place]: Walker - 2 wheels      Prior Function Level of Independence: Needs assistance   Gait / Transfers Assistance Needed: doesn't use assistive device however recommended     Comments: has 24/7 caregivers     Hand Dominance        Extremity/Trunk Assessment               Lower Extremity Assessment: Generalized weakness         Communication   Communication: No difficulties  Cognition Arousal/Alertness: Awake/alert Behavior During Therapy: Flat affect Overall Cognitive Status: History of cognitive impairments - at baseline                      General Comments      Exercises        Assessment/Plan    PT Assessment All further PT needs can be met in the next venue of care  PT Diagnosis Difficulty walking   PT Problem List Decreased strength;Decreased mobility;Decreased balance;Decreased knowledge of use of DME;Decreased safety awareness  PT Treatment Interventions     PT Goals (Current goals can be found in the Care Plan section) Acute Rehab PT Goals PT Goal Formulation: No goals set, d/c therapy    Frequency     Barriers to discharge        Co-evaluation               End of Session Equipment Utilized During Treatment: Gait belt Activity Tolerance: Patient tolerated treatment well Patient left: in bed;with call bell/phone within reach;with family/visitor present      Functional Assessment Tool Used: clinical judgement Functional Limitation: Mobility: Walking and moving around Mobility: Walking and Moving Around Current Status ([id number]): At least 1 percent but less than 20 percent impaired, limited or restricted Mobility: Walking and Moving Around Goal Status [phone]):  At least 1 percent but less than 20 percent impaired, limited or restricted Mobility: Walking and Moving Around Discharge Status [phone]): At least 1 percent but less than 20 percent impaired, limited or restricted    Time: 1118-1130 PT Time Calculation (min): 12 min   Charges:   PT Evaluation $Initial PT Evaluation Tier I: 1 Procedure PT Treatments $Gait Training: 8-22 mins   PT G Codes:   Functional Assessment Tool Used: clinical judgement Functional Limitation: Mobility: Walking and moving around    Jerry Kerr 11/09/2013, 12:02 PM Jerry Kerr, PT, DPT 11/09/2013 Pager: [phone]

## 2013-11-11 ENCOUNTER — Emergency Department (HOSPITAL_COMMUNITY): Payer: Medicare Other

## 2013-11-11 ENCOUNTER — Encounter (HOSPITAL_COMMUNITY): Payer: Self-pay | Admitting: Emergency Medicine

## 2013-11-11 ENCOUNTER — Emergency Department (HOSPITAL_COMMUNITY)
Admission: EM | Admit: 2013-11-11 | Discharge: 2013-11-11 | Disposition: A | Discharge status: Home / Self Care | Payer: Medicare Other | Attending: Emergency Medicine | Admitting: Emergency Medicine

## 2013-11-11 DIAGNOSIS — I10 Essential (primary) hypertension: Secondary | ICD-10-CM | POA: Insufficient documentation

## 2013-11-11 DIAGNOSIS — IMO0002 Reserved for concepts with insufficient information to code with codable children: Secondary | ICD-10-CM | POA: Diagnosis present

## 2013-11-11 DIAGNOSIS — F0390 Unspecified dementia without behavioral disturbance: Secondary | ICD-10-CM | POA: Insufficient documentation

## 2013-11-11 DIAGNOSIS — Y939 Activity, unspecified: Secondary | ICD-10-CM | POA: Insufficient documentation

## 2013-11-11 DIAGNOSIS — Z79899 Other long term (current) drug therapy: Secondary | ICD-10-CM | POA: Diagnosis not present

## 2013-11-11 DIAGNOSIS — F411 Generalized anxiety disorder: Secondary | ICD-10-CM | POA: Insufficient documentation

## 2013-11-11 DIAGNOSIS — Y929 Unspecified place or not applicable: Secondary | ICD-10-CM | POA: Insufficient documentation

## 2013-11-11 DIAGNOSIS — W19XXXA Unspecified fall, initial encounter: Secondary | ICD-10-CM | POA: Diagnosis not present

## 2013-11-11 DIAGNOSIS — Z950 Presence of cardiac pacemaker: Secondary | ICD-10-CM | POA: Diagnosis not present

## 2013-11-11 DIAGNOSIS — F329 Major depressive disorder, single episode, unspecified: Secondary | ICD-10-CM | POA: Diagnosis not present

## 2013-11-11 DIAGNOSIS — Z8546 Personal history of malignant neoplasm of prostate: Secondary | ICD-10-CM | POA: Diagnosis not present

## 2013-11-11 DIAGNOSIS — K219 Gastro-esophageal reflux disease without esophagitis: Secondary | ICD-10-CM | POA: Diagnosis not present

## 2013-11-11 DIAGNOSIS — J449 Chronic obstructive pulmonary disease, unspecified: Secondary | ICD-10-CM | POA: Insufficient documentation

## 2013-11-11 DIAGNOSIS — Z7982 Long term (current) use of aspirin: Secondary | ICD-10-CM | POA: Insufficient documentation

## 2013-11-11 DIAGNOSIS — F3289 Other specified depressive episodes: Secondary | ICD-10-CM | POA: Insufficient documentation

## 2013-11-11 DIAGNOSIS — Z87891 Personal history of nicotine dependence: Secondary | ICD-10-CM | POA: Diagnosis not present

## 2013-11-11 DIAGNOSIS — S32009A Unspecified fracture of unspecified lumbar vertebra, initial encounter for closed fracture: Secondary | ICD-10-CM | POA: Diagnosis not present

## 2013-11-11 DIAGNOSIS — J4489 Other specified chronic obstructive pulmonary disease: Secondary | ICD-10-CM | POA: Insufficient documentation

## 2013-11-11 DIAGNOSIS — S32000A Wedge compression fracture of unspecified lumbar vertebra, initial encounter for closed fracture: Secondary | ICD-10-CM

## 2013-11-11 MED ORDER — HYDROMORPHONE HCL PF 1 MG/ML IJ SOLN
1.0000 mg | Freq: Once | INTRAMUSCULAR | Status: AC
  Administered 2013-11-11: 1 mg via INTRAVENOUS
  Filled 2013-11-11: qty 1

## 2013-11-11 MED ORDER — DOCUSATE SODIUM 100 MG PO CAPS: 100.0000 mg | ORAL_CAPSULE | Freq: Two times a day (BID) | ORAL | Status: DC | PRN

## 2013-11-11 MED ORDER — IBUPROFEN 200 MG PO TABS
400.0000 mg | ORAL_TABLET | Freq: Once | ORAL | Status: AC
  Administered 2013-11-11: 400 mg via ORAL
  Filled 2013-11-11: qty 2

## 2013-11-11 MED ORDER — OXYCODONE-ACETAMINOPHEN 5-325 MG PO TABS
1.0000 | ORAL_TABLET | ORAL | Status: DC | PRN
Start: 2013-11-11 — End: 2013-12-11

## 2013-11-11 MED ORDER — FENTANYL CITRATE 0.05 MG/ML IJ SOLN
50.0000 ug | Freq: Once | INTRAMUSCULAR | Status: AC
  Administered 2013-11-11: 50 ug via INTRAMUSCULAR
  Filled 2013-11-11: qty 2

## 2013-11-11 NOTE — Discharge Instructions (Signed)
Back, Compression Fracture °A compression fracture happens when a force is put upon the length of your spine. Slipping and falling on your bottom are examples of such a force. When this happens, sometimes the force is great enough to compress the building blocks (vertebral bodies) of your spine. Although this causes a lot of pain, this can usually be treated at home, unless your caregiver feels hospitalization is needed for pain control. °Your backbone (spinal column) is made up of 24 main vertebral bodies in addition to the sacrum and coccyx (see illustration). These are held together by tough fibrous tissues (ligaments) and by support of your muscles. Nerve roots pass through the openings between the vertebrae. A sudden wrenching move, injury, or a fall may cause a compression fracture of one of the vertebral bodies. This may result in back pain or spread of pain into the belly (abdomen), the buttocks, and down the leg into the foot. Pain may also be created by muscle spasm alone. °Large studies have been undertaken to determine the best possible course of action to help your back following injury and also to prevent future problems. The recommendations are as follows. °FOLLOWING A COMPRESSION FRACTURE: °Do the following only if advised by your caregiver.  °· If a back brace has been suggested or provided, wear it as directed. °· Do not stop wearing the back brace unless instructed by your caregiver. °· When allowed to return to regular activities, avoid a sedentary lifestyle. Actively exercise. Sporadic weekend binges of tennis, racquetball, or waterskiing may actually aggravate or create problems, especially if you are not in condition for that activity. °· Avoid sports requiring sudden body movements until you are in condition for them. Swimming and walking are safer activities. °· Maintain good posture. °· Avoid obesity. °· If not already done, you should have a DEXA scan. Based on the results, be treated for  osteoporosis. °FOLLOWING ACUTE (SUDDEN) INJURY: °· Only take over-the-counter or prescription medicines for pain, discomfort, or fever as directed by your caregiver. °· Use bed rest for only the most extreme acute episode. Prolonged bed rest may aggravate your condition. Ice used for acute conditions is effective. Use a large plastic bag filled with ice. Wrap it in a towel. This also provides excellent pain relief. This may be continuous. Or use it for 30 minutes every 2 hours during acute phase, then as needed. Heat for 30 minutes prior to activities is helpful. °· As soon as the acute phase (the time when your back is too painful for you to do normal activities) is over, it is important to resume normal activities and work hardening programs. Back injuries can cause potentially marked changes in lifestyle. So it is important to attack these problems aggressively. °· See your caregiver for continued problems. He or she can help or refer you for appropriate exercises, physical therapy, and work hardening if needed. °· If you are given narcotic medications for your condition, for the next 24 hours do not: °¨ Drive. °¨ Operate machinery or power tools. °¨ Sign legal documents. °· Do not drink alcohol, or take sleeping pills or other medications that may interfere with treatment. °If your caregiver has given you a follow-up appointment, it is very important to keep that appointment. Not keeping the appointment could result in a chronic or permanent injury, pain, and disability. If there is any problem keeping the appointment, you must call back to this facility for assistance.  °SEEK IMMEDIATE MEDICAL CARE IF: °· You develop numbness,   tingling, weakness, or problems with the use of your arms or legs. °· You develop severe back pain not relieved with medications. °· You have changes in bowel or bladder control. °· You have increasing pain in any areas of the body. °Document Released: 03/17/2005 Document Revised:  08/01/2013 Document Reviewed: 10/20/2007 °ExitCare® Patient Information ©2015 [employer]. This information is not intended to replace advice given to you by your health care provider. Make sure you discuss any questions you have with your health care provider. ° °

## 2013-11-11 NOTE — ED Notes (Signed)
Patient presents to ED from [facility] via EMS. EMS reports patient has a chief complaint of bilateral lower back pain x 3 weeks since falling. Patient denies urinary symptoms.

## 2013-11-11 NOTE — ED Notes (Signed)
Patient to be transported to [place] via [place]. [facility] contacted for transport.

## 2013-11-11 NOTE — ED Notes (Signed)
Bed: WA06 Expected date:  Expected time:  Means of arrival:  Comments: EMS-back pain 

## 2013-11-11 NOTE — ED Notes (Signed)
Patient to xray.

## 2013-11-11 NOTE — Progress Notes (Addendum)
  CARE MANAGEMENT ED NOTE 11/11/2013  Patient:  Jerry Kerr, Jerry Kerr   Account Number:  [id number]  Date Initiated:  11/11/2013  Documentation initiated by:  Jackelyn Poling  Subjective/Objective Assessment:   80 yr old male medicare pt form [facility] for bilateral lower back pain x 3 weeks since falling. Patient denies urinary symptoms.     Subjective/Objective Assessment Detail:   pcp noted a Dr Ronnald Ramp in [facility]  But is being seen by Dr Mallie Mussel tripp Pt is active with [facility] home health services for HHPT  since d/c 11/09/13 from [facility]      Action/Plan:   ED noted pcp not in system checked chart hx updated dr tripp as dr at facility    Action/Plan Detail:   Anticipated DC Date:  11/11/2013     Status Recommendation to Physician:   Result of Recommendation:    Other ED [place]  Other  Outpatient Services - Pt will follow up  PCP issues    Choice offered to / List presented to:            Status of service:  Completed, signed off  ED Comments:   ED Comments Detail:

## 2013-11-14 ENCOUNTER — Other Ambulatory Visit: Payer: Self-pay

## 2013-11-14 ENCOUNTER — Telehealth: Payer: Self-pay

## 2013-11-14 DIAGNOSIS — R1319 Other dysphagia: Secondary | ICD-10-CM

## 2013-11-14 MED ORDER — OMEPRAZOLE 40 MG PO CPDR: 40.0000 mg | DELAYED_RELEASE_CAPSULE | Freq: Two times a day (BID) | ORAL | Status: DC

## 2013-11-14 NOTE — Telephone Encounter (Signed)
Message copied by Algernon Huxley on Mon Nov 14, 2013  1:16 PM ------      Message from: Jerene Bears      Created: Mon Nov 14, 2013 12:24 PM       Vaughan Basta      This is the father of a friend.  He is also my patient.      Recently in the hospital, new swallowing difficulties      Failed speech and swallow evaluation and now on pured diet, not eating very much      Can we have his nursing home increase omeprazole to 40 mg twice daily before meals      And also order a outpatient speech and swallow evaluation as soon as possible for reevaluation of swallowing and diet assessment      Thanks      JMP            His daughter, [name], is the contact      Thank you      Ulice Dash       ------

## 2013-11-14 NOTE — Telephone Encounter (Signed)
Pt is now at [facility], he is seen by a nurse from [facility]. Call placed to [facility] to give order for increasing Prilosec to 40mg  BID. Left message for nurse to call back.

## 2013-11-14 NOTE — Telephone Encounter (Signed)
Spoke with Margaretha Sheffield RN from [facility] and script needs to be sent to [facility]. Script sent to pharmacy. Modified barium swallow ordered.

## 2013-11-15 ENCOUNTER — Other Ambulatory Visit (HOSPITAL_COMMUNITY): Payer: Self-pay | Admitting: Internal Medicine

## 2013-11-15 ENCOUNTER — Telehealth: Payer: Self-pay

## 2013-11-15 DIAGNOSIS — R1310 Dysphagia, unspecified: Secondary | ICD-10-CM

## 2013-11-15 NOTE — Telephone Encounter (Signed)
Message copied by Algernon Huxley on Tue Nov 15, 2013 10:22 AM ------      Message from: Crystie Yanko, [place] R      Created: Mon Nov 14, 2013  2:59 PM      Regarding: Check on MB swallow and speech       Call and check on this. ------

## 2013-11-15 NOTE — Telephone Encounter (Signed)
Pt scheduled for modified barium swallow with SLP at [facility] 11/17/13 pt to arrive there at 10:45am for an 11am appt. Pts daughter aware of appt.

## 2013-11-17 ENCOUNTER — Ambulatory Visit (HOSPITAL_COMMUNITY)
Admission: RE | Admit: 2013-11-17 | Discharge: 2013-11-17 | Disposition: A | Discharge status: Home / Self Care | Payer: Medicare Other | Source: Ambulatory Visit | Attending: Internal Medicine | Admitting: Internal Medicine

## 2013-11-17 DIAGNOSIS — R1312 Dysphagia, oropharyngeal phase: Secondary | ICD-10-CM | POA: Diagnosis present

## 2013-11-17 DIAGNOSIS — F0390 Unspecified dementia without behavioral disturbance: Secondary | ICD-10-CM | POA: Diagnosis not present

## 2013-11-17 DIAGNOSIS — F1010 Alcohol abuse, uncomplicated: Secondary | ICD-10-CM | POA: Insufficient documentation

## 2013-11-17 DIAGNOSIS — R1313 Dysphagia, pharyngeal phase: Secondary | ICD-10-CM | POA: Diagnosis not present

## 2013-11-17 DIAGNOSIS — Z9889 Other specified postprocedural states: Secondary | ICD-10-CM | POA: Insufficient documentation

## 2013-11-17 DIAGNOSIS — R1319 Other dysphagia: Secondary | ICD-10-CM | POA: Diagnosis not present

## 2013-11-17 DIAGNOSIS — K449 Diaphragmatic hernia without obstruction or gangrene: Secondary | ICD-10-CM | POA: Diagnosis not present

## 2013-11-17 DIAGNOSIS — R1310 Dysphagia, unspecified: Secondary | ICD-10-CM

## 2013-11-17 DIAGNOSIS — K219 Gastro-esophageal reflux disease without esophagitis: Secondary | ICD-10-CM | POA: Diagnosis not present

## 2013-11-17 DIAGNOSIS — R634 Abnormal weight loss: Secondary | ICD-10-CM | POA: Insufficient documentation

## 2013-11-17 DIAGNOSIS — I10 Essential (primary) hypertension: Secondary | ICD-10-CM | POA: Insufficient documentation

## 2013-11-17 DIAGNOSIS — Z950 Presence of cardiac pacemaker: Secondary | ICD-10-CM | POA: Diagnosis not present

## 2013-11-17 DIAGNOSIS — J4489 Other specified chronic obstructive pulmonary disease: Secondary | ICD-10-CM | POA: Insufficient documentation

## 2013-11-17 DIAGNOSIS — R1314 Dysphagia, pharyngoesophageal phase: Secondary | ICD-10-CM | POA: Insufficient documentation

## 2013-11-17 DIAGNOSIS — J449 Chronic obstructive pulmonary disease, unspecified: Secondary | ICD-10-CM | POA: Insufficient documentation

## 2013-11-17 NOTE — Procedures (Signed)
Objective Swallowing Evaluation: Modified Barium Swallowing Study  Patient Details  Name: Jerry Kerr MRN: [id number] Date of Birth: 1933/07/13  Today's Date: 11/17/2013 Time: 2725-3664 SLP Time Calculation (min): 27 min  Past Medical History:  Past Medical History  Diagnosis Date  . Asthma   . Dementia   . Back pain   . COPD (chronic obstructive pulmonary disease)   . Hypertension   . Pacemaker   . Anxiety   . Depression   . Cancer     SKIN, PROSTATE  . GERD (gastroesophageal reflux disease)   . Substance abuse     ETOH   Past Surgical History:  Past Surgical History  Procedure Laterality Date  . Neck surgery    . Upper gastrointestinal endoscopy    . Colonoscopy    . Ankle surgery    . Skin graft    . Pacemaker insertion    . Tonsillectomy     HPI:  80 yo male presents from ALF for reassessment of oropharyngeal swallowing function. PMH is significant for dementia, neck surgery, reflux, hiatal hernia, esophageal stretching, and unintentional weight loss. Most recent MBS 11/08/13 recommended Dys 1 and nectar thick liquids. Pt has since transitioned at ALF to Dys 3 textures and thin liquids.     Assessment / Plan / Recommendation Clinical Impression  Dysphagia Diagnosis: Moderate pharyngeal phase dysphagia;Mild cervical esophageal phase dysphagia Clinical impression: Pt demonstrates significant improvement since most recent MBS 11/08/13, although continues to exhibit decreased hyolaryngeal movement and epiglottic deflection secondary to cervical hardware present. Pt's moderate pharyngeal dysphagia due to the above results in small amounts of thin and nectar thick liquids being penetrated above the vocal folds, which is mostly (although not entirely) cleared with Max cues for a throat clear. A chin tuck was effective at increasing airway protection as evidenced by NO penetration/aspiration with nectar thick liquids, and reduced amount of penetrates with thin liquid. A chin  tuck was also effective at reducing vallecular residuals with all consistencies tested. Recommend Dys 3 (mechanical soft) textures and nectar thick liquids with use of chin tuck, with meds crushed in puree. Pending cognitive status and ability to follow instructions, pt may be an appropriate candidate for the water protocol.  Skilled intervention included multimodal cueing for utilization of compensatory strategies as well as education related to results and recommendations with patient, wife, and caregiver present. Recommend continued f/u with [facility] SLP.    Treatment Recommendation  Defer treatment plan to SLP at (Comment) [facility])    Diet Recommendation Dysphagia 3 (Mechanical Soft);Nectar-thick liquid (water protocol? defer to treating SLP)   Liquid Administration via: Cup;No straw Medication Administration: Crushed with puree Supervision: Patient able to self feed;Full supervision/cueing for compensatory strategies Compensations: Slow rate;Small sips/bites;Multiple dry swallows after each bite/sip;Clear throat intermittently Postural Changes and/or Swallow Maneuvers: Seated upright 90 degrees;Upright 30-60 min after meal;Chin tuck    Other  Recommendations Oral Care Recommendations: Oral care BID   Follow Up Recommendations  Home health SLP    Frequency and Duration        Pertinent Vitals/Pain n/a    SLP Swallow Goals     General Date of Onset:  (past four weeks) HPI: 80 yo male presents from ALF for reassessment of oropharyngeal swallowing function. PMH is significant for dementia, neck surgery, reflux, hiatal hernia, esophageal stretching, and unintentional weight loss. Most recent MBS 11/08/13 recommended Dys 1 and nectar thick liquids. Pt has since transitioned at ALF to Dys 3 textures and thin liquids. Type of Study:  Modified Barium Swallowing Study Reason for Referral: Objectively evaluate swallowing function Previous Swallow Assessment: see HPI Diet Prior to this Study: Dysphagia  3 (soft);Thin liquids Respiratory Status: Room air History of Recent Intubation: No Behavior/Cognition: Alert;Cooperative;Pleasant mood;Requires cueing Oral Cavity - Dentition: Poor condition Oral Motor / Sensory Function: Within functional limits Self-Feeding Abilities: Able to feed self Patient Positioning: Upright in chair Baseline Vocal Quality: Clear Volitional Cough: Strong Volitional Swallow: Able to elicit Anatomy:  (posterior and anterior cervical hardware C4-C7) Pharyngeal Secretions: Not observed secondary MBS    Reason for Referral Objectively evaluate swallowing function   Oral Phase Oral Preparation/Oral Phase Oral Phase: WFL   Pharyngeal Phase Pharyngeal Phase Pharyngeal Phase: Impaired Pharyngeal - Nectar Pharyngeal - Nectar Teaspoon: Not tested Pharyngeal - Nectar Cup: Reduced epiglottic inversion;Reduced airway/laryngeal closure;Reduced anterior laryngeal mobility;Penetration/Aspiration during swallow;Pharyngeal residue - valleculae;Compensatory strategies attempted (Comment) (chin tuck effective at increasing airway protection) Penetration/Aspiration details (nectar cup): Material enters airway, remains ABOVE vocal cords and not ejected out (does not enter with chin tuck) Pharyngeal - Nectar Straw: Not tested Pharyngeal - Thin Pharyngeal - Thin Cup: Reduced epiglottic inversion;Reduced airway/laryngeal closure;Reduced anterior laryngeal mobility;Penetration/Aspiration during swallow;Pharyngeal residue - valleculae;Compensatory strategies attempted (Comment) (chin tuck decreases but doesn't prevent penetration) Penetration/Aspiration details (thin cup): Material enters airway, remains ABOVE vocal cords and not ejected out Pharyngeal - Thin Straw: Not tested Pharyngeal - Solids Pharyngeal - Puree: Reduced epiglottic inversion;Reduced airway/laryngeal closure;Reduced anterior laryngeal mobility;Pharyngeal residue - valleculae;Compensatory strategies attempted (Comment)  (chin tuck decreases residuals) Penetration/Aspiration details (puree): Material does not enter airway Pharyngeal - Mechanical Soft: Reduced epiglottic inversion;Reduced anterior laryngeal mobility;Reduced airway/laryngeal closure;Pharyngeal residue - valleculae;Compensatory strategies attempted (Comment) (chin tuck reduces residuals) Pharyngeal - Regular: Not tested  Cervical Esophageal Phase    GO    Cervical Esophageal Phase Cervical Esophageal Phase: Impaired (impacted by cervical hardware)    Functional Assessment Tool Used: clinical judgement Functional Limitations: Swallowing Swallow Current Status (R1540): At least 20 percent but less than 40 percent impaired, limited or restricted Swallow Goal Status [phone]): At least 20 percent but less than 40 percent impaired, limited or restricted Swallow Discharge Status [phone]): At least 20 percent but less than 40 percent impaired, limited or restricted     Germain Osgood, M.A. CCC-SLP [phone]  Germain Osgood 11/17/2013, 12:14 PM

## 2013-11-17 NOTE — ED Provider Notes (Signed)
CSN: [id number]     Arrival date & time 11/11/13  1156 History   First MD Initiated Contact with Patient 11/11/13 1204     Chief Complaint  Patient presents with  . Back Pain     (Consider location/radiation/quality/duration/timing/severity/associated sxs/prior Treatment) HPI  80 year old male with lower back pain. Patient had a recent syncopal episode and admitted to the hospital. He states that he has had lower back pain since then. Denies any acute interim trauma. Pain is tolerable at rest. Exacerbated by any movement. Can and relate to others pain is definitely worse. No acute numbness, tingling or loss of strength. Denies any history of similar back pain prior to this fall. No fevers or chills. No urinary complaints. Baseline mental status per her caretaker at bedside.   Past Medical History  Diagnosis Date  . Asthma   . Dementia   . Back pain   . COPD (chronic obstructive pulmonary disease)   . Hypertension   . Pacemaker   . Anxiety   . Depression   . Cancer     SKIN, PROSTATE  . GERD (gastroesophageal reflux disease)   . Substance abuse     ETOH   Past Surgical History  Procedure Laterality Date  . Neck surgery    . Upper gastrointestinal endoscopy    . Colonoscopy    . Ankle surgery    . Skin graft    . Pacemaker insertion    . Tonsillectomy     Family History  Problem Relation Age of Onset  . CAD Father   . Heart disease Father   . Hypertension Mother   . AAA (abdominal aortic aneurysm) Mother   . Heart disease Mother    History  Substance Use Topics  . 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 Smokeless tobacco: Never Used  . Alcohol Use: No    Review of Systems  All systems reviewed and negative, other than as noted in HPI.   Allergies  Review of patient's allergies indicates no known allergies.  Home Medications   Prior to Admission medications   Medication Sig Start Date End Date Taking? Authorizing Provider  acetaminophen (TYLENOL) 500 MG tablet  Take 500 mg by mouth 3 (three) times daily.    Yes Historical Provider, MD  albuterol (PROAIR HFA) 108 (90 BASE) MCG/ACT inhaler Inhale 2 puffs into the lungs 4 (four) times daily as needed for wheezing or shortness of breath.    Yes Historical Provider, MD  alum & mag hydroxide-simeth (MAALOX ADVANCED) 200-200-20 MG/5ML suspension Take 30 mLs by mouth every 6 (six) hours as needed for indigestion or heartburn.   Yes Historical Provider, MD  aspirin EC 81 MG tablet Take 81 mg by mouth daily.   Yes Historical Provider, MD  divalproex (DEPAKOTE SPRINKLE) 125 MG capsule Take 500 mg by mouth 2 (two) times daily.   Yes Historical Provider, MD  Lactobacillus Rhamnosus, GG, (CULTURELLE KIDS) PACK Take 1 packet by mouth daily.   Yes Historical Provider, MD  lisinopril (PRINIVIL,ZESTRIL) 5 MG tablet Take 5 mg by mouth daily.   Yes Historical Provider, MD  loperamide (IMODIUM) 2 MG capsule Take 2 mg by mouth as needed for diarrhea or loose stools.   Yes Historical Provider, MD  LORazepam (ATIVAN) 0.5 MG tablet Take 0.5 mg by mouth daily at 12 noon.    Yes Historical Provider, MD  LORazepam (ATIVAN) 1 MG tablet Take 1 mg by mouth See admin instructions. Take 1 tab by mouth every 6 hours  as needed for anxiety and 1 tablet at bedtime   Yes Historical Provider, MD  magnesium hydroxide (MILK OF MAGNESIA) 400 MG/5ML suspension Take 30 mLs by mouth daily as needed for mild constipation.   Yes Historical Provider, MD  memantine (NAMENDA) 10 MG tablet Take 10 mg by mouth 2 (two) times daily.   Yes Historical Provider, MD  metoprolol (LOPRESSOR) 50 MG tablet Take 50 mg by mouth 2 (two) times daily.   Yes Historical Provider, MD  omeprazole (PRILOSEC) 20 MG capsule Take 40 mg by mouth daily.    Yes Historical Provider, MD  potassium chloride 20 MEQ/15ML (10%) solution Take 20 mEq by mouth daily.   Yes Historical Provider, MD  QUEtiapine (SEROQUEL) 50 MG tablet Take 50 mg by mouth at bedtime.   Yes Historical Provider, MD   sertraline (ZOLOFT) 50 MG tablet Take 50 mg by mouth daily.   Yes Historical Provider, MD  tiotropium (SPIRIVA) 18 MCG inhalation capsule Place 18 mcg into inhaler and inhale daily.   Yes Historical Provider, MD  traMADol (ULTRAM) 50 MG tablet Take 50 mg by mouth 3 (three) times daily as needed (for pain).   Yes Historical Provider, MD  traZODone (DESYREL) 50 MG tablet Take 25 mg by mouth at bedtime as needed for sleep.    Yes Historical Provider, MD  white petrolatum (VASELINE) GEL Apply 1 application topically daily as needed (for wound area).   Yes Historical Provider, MD  docusate sodium (COLACE) 100 MG capsule Take 1 capsule (100 mg total) by mouth 2 (two) times daily as needed for mild constipation (while taking pain medication). 11/11/13   Virgel Manifold, MD  omeprazole (PRILOSEC) 40 MG capsule Take 1 capsule (40 mg total) by mouth 2 (two) times daily. 11/14/13   Jerene Bears, MD  oxyCODONE-acetaminophen (PERCOCET/ROXICET) 5-325 MG per tablet Take 1-2 tablets by mouth every 4 (four) hours as needed for severe pain. 11/11/13   Virgel Manifold, MD   BP 168/76  Pulse 68  Temp(Src) 98.2 F (36.8 C) (Oral)  Resp 18  SpO2 94% Physical Exam  Nursing note and vitals reviewed. Constitutional: He appears well-developed and well-nourished. No distress.  HENT:  Head: Normocephalic and atraumatic.  Eyes: Conjunctivae are normal. Pupils are equal, round, and reactive to light. Right eye exhibits no discharge. Left eye exhibits no discharge.  Neck: Neck supple.  Cardiovascular: Normal rate, regular rhythm and normal heart sounds.  Exam reveals no gallop and no friction rub.   No murmur heard. Pulmonary/Chest: Effort normal and breath sounds normal. No respiratory distress.  Abdominal: Soft. He exhibits no distension. There is no tenderness.  Musculoskeletal: He exhibits no edema and no tenderness.  Increased lower back pain with flexion of either hip. NVI distally. When palpating back says that area  of pain is in midline mid lumbar region but denies significantly worse with palpation.   Neurological: He is alert.  Skin: Skin is warm and dry.  Psychiatric: He has a normal mood and affect. His behavior is normal. Thought content normal.    ED Course  Procedures (including critical care time) Labs Review Labs Reviewed - No data to display  Imaging Review No results found.   Dg Lumbar Spine Complete  11/11/2013   CLINICAL DATA:  Low back pain and tenderness post fall  EXAM: LUMBAR SPINE - COMPLETE 4+ VIEW  COMPARISON:  None ; correlation chest radiographs 11/07/2013  FINDINGS: Five non-rib-bearing lumbar vertebrae.  Diffuse osseous demineralization.  Multilevel disc space narrowing  and endplate spur formation.  Superior endplate compression fracture of L1 vertebral body, approximately 50% height loss anteriorly, suspect present on recent chest radiograph.  No additional fracture or subluxation.  No spondylolysis.  Facet degenerative changes lower lumbar spine.  Scattered colonic diverticulosis with retained contrast in distal colon.  SI joints symmetric.  IMPRESSION: Osseous demineralization with superior endplate compression fracture of L1 vertebral body, demonstrating approximately 50% anterior height loss.  This is suspect present on recent chest radiographs.  Degenerative disc and facet disease.  Sigmoid diverticulosis.   Electronically Signed   By: Lavonia Dana M.D.   On: 11/11/2013 13:42    EKG Interpretation None      MDM   Final diagnoses:  Lumbar compression fracture, closed, initial encounter    80 year old male with lower back pain after recent fall. He was admitted to hospital for evaluation of syncope. He does not appear to have had imaging of his lower back though. Severe for lumbar compression fracture. Given his recent fall I suspect that this is acute. It correlates to the area of his pain.  Patient can ambulate, although with increased pain. Plan symptomatic treatment.  Discharge back to [facility]    Virgel Manifold, MD 11/17/13 [phone]

## 2013-12-11 ENCOUNTER — Encounter (HOSPITAL_COMMUNITY): Payer: Self-pay | Admitting: Emergency Medicine

## 2013-12-11 ENCOUNTER — Inpatient Hospital Stay (HOSPITAL_COMMUNITY)
Admission: EM | Admit: 2013-12-11 | Discharge: 2013-12-16 | DRG: 056 | Disposition: A | Discharge status: Nursing Home (Includes ICF) | Payer: Medicare Other | Attending: Internal Medicine | Admitting: Internal Medicine

## 2013-12-11 DIAGNOSIS — S32020S Wedge compression fracture of second lumbar vertebra, sequela: Secondary | ICD-10-CM

## 2013-12-11 DIAGNOSIS — W19XXXA Unspecified fall, initial encounter: Secondary | ICD-10-CM | POA: Diagnosis present

## 2013-12-11 DIAGNOSIS — R4182 Altered mental status, unspecified: Secondary | ICD-10-CM

## 2013-12-11 DIAGNOSIS — K222 Esophageal obstruction: Secondary | ICD-10-CM

## 2013-12-11 DIAGNOSIS — K219 Gastro-esophageal reflux disease without esophagitis: Secondary | ICD-10-CM | POA: Diagnosis present

## 2013-12-11 DIAGNOSIS — Y9210 Unspecified residential institution as the place of occurrence of the external cause: Secondary | ICD-10-CM | POA: Diagnosis present

## 2013-12-11 DIAGNOSIS — R410 Disorientation, unspecified: Secondary | ICD-10-CM

## 2013-12-11 DIAGNOSIS — Z7982 Long term (current) use of aspirin: Secondary | ICD-10-CM

## 2013-12-11 DIAGNOSIS — F0280 Dementia in other diseases classified elsewhere without behavioral disturbance: Principal | ICD-10-CM | POA: Diagnosis present

## 2013-12-11 DIAGNOSIS — W010XXA Fall on same level from slipping, tripping and stumbling without subsequent striking against object, initial encounter: Secondary | ICD-10-CM | POA: Diagnosis present

## 2013-12-11 DIAGNOSIS — G9340 Encephalopathy, unspecified: Secondary | ICD-10-CM

## 2013-12-11 DIAGNOSIS — Z66 Do not resuscitate: Secondary | ICD-10-CM | POA: Diagnosis present

## 2013-12-11 DIAGNOSIS — Z8546 Personal history of malignant neoplasm of prostate: Secondary | ICD-10-CM

## 2013-12-11 DIAGNOSIS — J438 Other emphysema: Secondary | ICD-10-CM

## 2013-12-11 DIAGNOSIS — F1011 Alcohol abuse, in remission: Secondary | ICD-10-CM | POA: Diagnosis present

## 2013-12-11 DIAGNOSIS — R404 Transient alteration of awareness: Secondary | ICD-10-CM | POA: Diagnosis not present

## 2013-12-11 DIAGNOSIS — M48061 Spinal stenosis, lumbar region without neurogenic claudication: Secondary | ICD-10-CM

## 2013-12-11 DIAGNOSIS — E860 Dehydration: Secondary | ICD-10-CM

## 2013-12-11 DIAGNOSIS — F411 Generalized anxiety disorder: Secondary | ICD-10-CM | POA: Diagnosis present

## 2013-12-11 DIAGNOSIS — Z85828 Personal history of other malignant neoplasm of skin: Secondary | ICD-10-CM

## 2013-12-11 DIAGNOSIS — I10 Essential (primary) hypertension: Secondary | ICD-10-CM

## 2013-12-11 DIAGNOSIS — G309 Alzheimer's disease, unspecified: Secondary | ICD-10-CM | POA: Diagnosis not present

## 2013-12-11 DIAGNOSIS — Z79899 Other long term (current) drug therapy: Secondary | ICD-10-CM

## 2013-12-11 DIAGNOSIS — Z950 Presence of cardiac pacemaker: Secondary | ICD-10-CM

## 2013-12-11 DIAGNOSIS — Z860101 Personal history of adenomatous and serrated colon polyps: Secondary | ICD-10-CM

## 2013-12-11 DIAGNOSIS — Z981 Arthrodesis status: Secondary | ICD-10-CM

## 2013-12-11 DIAGNOSIS — F0391 Unspecified dementia with behavioral disturbance: Secondary | ICD-10-CM

## 2013-12-11 DIAGNOSIS — Z9181 History of falling: Secondary | ICD-10-CM

## 2013-12-11 DIAGNOSIS — F3289 Other specified depressive episodes: Secondary | ICD-10-CM | POA: Diagnosis present

## 2013-12-11 DIAGNOSIS — S32020A Wedge compression fracture of second lumbar vertebra, initial encounter for closed fracture: Secondary | ICD-10-CM

## 2013-12-11 DIAGNOSIS — J449 Chronic obstructive pulmonary disease, unspecified: Secondary | ICD-10-CM | POA: Diagnosis present

## 2013-12-11 DIAGNOSIS — R1310 Dysphagia, unspecified: Secondary | ICD-10-CM

## 2013-12-11 DIAGNOSIS — R55 Syncope and collapse: Secondary | ICD-10-CM

## 2013-12-11 DIAGNOSIS — Z86010 Personal history of colonic polyps: Secondary | ICD-10-CM

## 2013-12-11 DIAGNOSIS — S32009A Unspecified fracture of unspecified lumbar vertebra, initial encounter for closed fracture: Secondary | ICD-10-CM | POA: Diagnosis present

## 2013-12-11 DIAGNOSIS — K2270 Barrett's esophagus without dysplasia: Secondary | ICD-10-CM | POA: Diagnosis present

## 2013-12-11 DIAGNOSIS — F329 Major depressive disorder, single episode, unspecified: Secondary | ICD-10-CM | POA: Diagnosis present

## 2013-12-11 DIAGNOSIS — Z87891 Personal history of nicotine dependence: Secondary | ICD-10-CM

## 2013-12-11 DIAGNOSIS — M5416 Radiculopathy, lumbar region: Secondary | ICD-10-CM

## 2013-12-11 DIAGNOSIS — F0390 Unspecified dementia without behavioral disturbance: Secondary | ICD-10-CM | POA: Diagnosis present

## 2013-12-11 DIAGNOSIS — R1319 Other dysphagia: Secondary | ICD-10-CM | POA: Diagnosis present

## 2013-12-11 DIAGNOSIS — J4489 Other specified chronic obstructive pulmonary disease: Secondary | ICD-10-CM | POA: Diagnosis present

## 2013-12-11 HISTORY — DX: Dysphagia, unspecified: R13.10

## 2013-12-11 NOTE — ED Notes (Signed)
Per family, pt fell on Friday and Saturday, unknown falls, the fall Friday was in the bathroom, pt has 24 hour care but the caregiver did not see him fall, states since then has had altered mental status, labored breathing, pts family also states pts depakote medication has been messed up on dosing, states pt has been taking too much, pt does have strong urine smell. Pt alert, states having back pain, has a hx of back pain but has worsened since fall.

## 2013-12-11 NOTE — ED Notes (Signed)
Bed: WA20 Expected date:  Expected time:  Means of arrival:  Comments: EMS increased confusion / falls

## 2013-12-11 NOTE — ED Notes (Signed)
Patient presents from [facility] via [facility]. Patient has Hx of dementia and has had increased recent falls and confusion. Daughter reports increased incontinence. Reports fell and hit head on Thursday.   VS: 97.3, 60 HR paced, 130/93, 92% on RA, 99% 3L.  20g L AC.   Hx of COPD, expiratory wheezing per EMS.

## 2013-12-12 ENCOUNTER — Inpatient Hospital Stay (HOSPITAL_COMMUNITY): Payer: Medicare Other

## 2013-12-12 ENCOUNTER — Emergency Department (HOSPITAL_COMMUNITY): Payer: Medicare Other

## 2013-12-12 ENCOUNTER — Encounter (HOSPITAL_COMMUNITY): Payer: Self-pay | Admitting: Internal Medicine

## 2013-12-12 DIAGNOSIS — Z85828 Personal history of other malignant neoplasm of skin: Secondary | ICD-10-CM | POA: Diagnosis not present

## 2013-12-12 DIAGNOSIS — K219 Gastro-esophageal reflux disease without esophagitis: Secondary | ICD-10-CM | POA: Diagnosis present

## 2013-12-12 DIAGNOSIS — Z7982 Long term (current) use of aspirin: Secondary | ICD-10-CM | POA: Diagnosis not present

## 2013-12-12 DIAGNOSIS — S32009A Unspecified fracture of unspecified lumbar vertebra, initial encounter for closed fracture: Secondary | ICD-10-CM | POA: Diagnosis present

## 2013-12-12 DIAGNOSIS — F3289 Other specified depressive episodes: Secondary | ICD-10-CM | POA: Diagnosis present

## 2013-12-12 DIAGNOSIS — F411 Generalized anxiety disorder: Secondary | ICD-10-CM | POA: Diagnosis present

## 2013-12-12 DIAGNOSIS — F1011 Alcohol abuse, in remission: Secondary | ICD-10-CM | POA: Diagnosis present

## 2013-12-12 DIAGNOSIS — Z8546 Personal history of malignant neoplasm of prostate: Secondary | ICD-10-CM | POA: Diagnosis not present

## 2013-12-12 DIAGNOSIS — Z87891 Personal history of nicotine dependence: Secondary | ICD-10-CM | POA: Diagnosis not present

## 2013-12-12 DIAGNOSIS — Z66 Do not resuscitate: Secondary | ICD-10-CM | POA: Diagnosis present

## 2013-12-12 DIAGNOSIS — F0280 Dementia in other diseases classified elsewhere without behavioral disturbance: Secondary | ICD-10-CM | POA: Diagnosis present

## 2013-12-12 DIAGNOSIS — W010XXA Fall on same level from slipping, tripping and stumbling without subsequent striking against object, initial encounter: Secondary | ICD-10-CM | POA: Diagnosis present

## 2013-12-12 DIAGNOSIS — J449 Chronic obstructive pulmonary disease, unspecified: Secondary | ICD-10-CM | POA: Diagnosis present

## 2013-12-12 DIAGNOSIS — G309 Alzheimer's disease, unspecified: Secondary | ICD-10-CM | POA: Diagnosis present

## 2013-12-12 DIAGNOSIS — G9340 Encephalopathy, unspecified: Secondary | ICD-10-CM | POA: Diagnosis present

## 2013-12-12 DIAGNOSIS — Z981 Arthrodesis status: Secondary | ICD-10-CM | POA: Diagnosis not present

## 2013-12-12 DIAGNOSIS — K2270 Barrett's esophagus without dysplasia: Secondary | ICD-10-CM | POA: Diagnosis present

## 2013-12-12 DIAGNOSIS — Z9181 History of falling: Secondary | ICD-10-CM | POA: Diagnosis not present

## 2013-12-12 DIAGNOSIS — I10 Essential (primary) hypertension: Secondary | ICD-10-CM | POA: Diagnosis present

## 2013-12-12 DIAGNOSIS — F329 Major depressive disorder, single episode, unspecified: Secondary | ICD-10-CM | POA: Diagnosis present

## 2013-12-12 DIAGNOSIS — R404 Transient alteration of awareness: Secondary | ICD-10-CM | POA: Diagnosis present

## 2013-12-12 DIAGNOSIS — R1310 Dysphagia, unspecified: Secondary | ICD-10-CM | POA: Diagnosis present

## 2013-12-12 DIAGNOSIS — Z79899 Other long term (current) drug therapy: Secondary | ICD-10-CM | POA: Diagnosis not present

## 2013-12-12 DIAGNOSIS — E860 Dehydration: Secondary | ICD-10-CM | POA: Diagnosis present

## 2013-12-12 DIAGNOSIS — Z950 Presence of cardiac pacemaker: Secondary | ICD-10-CM | POA: Diagnosis not present

## 2013-12-12 DIAGNOSIS — Y9210 Unspecified residential institution as the place of occurrence of the external cause: Secondary | ICD-10-CM | POA: Diagnosis present

## 2013-12-12 DIAGNOSIS — W19XXXA Unspecified fall, initial encounter: Secondary | ICD-10-CM | POA: Diagnosis present

## 2013-12-12 DIAGNOSIS — S32020A Wedge compression fracture of second lumbar vertebra, initial encounter for closed fracture: Secondary | ICD-10-CM | POA: Diagnosis present

## 2013-12-12 HISTORY — DX: Dysphagia, unspecified: R13.10

## 2013-12-12 LAB — CBC WITH DIFFERENTIAL/PLATELET
BASOS ABS: 0 10*3/uL (ref 0.0–0.1)
Basophils Relative: 1 % (ref 0–1)
Eosinophils Absolute: 0.3 10*3/uL (ref 0.0–0.7)
Eosinophils Relative: 4 % (ref 0–5)
HEMATOCRIT: 38.1 % — AB (ref 39.0–52.0)
Hemoglobin: 12.6 g/dL — ABNORMAL LOW (ref 13.0–17.0)
LYMPHS ABS: 1.5 10*3/uL (ref 0.7–4.0)
LYMPHS PCT: 17 % (ref 12–46)
MCH: 29.8 pg (ref 26.0–34.0)
MCHC: 33.1 g/dL (ref 30.0–36.0)
MCV: 90.1 fL (ref 78.0–100.0)
MONO ABS: 0.9 10*3/uL (ref 0.1–1.0)
Monocytes Relative: 11 % (ref 3–12)
Neutro Abs: 5.8 10*3/uL (ref 1.7–7.7)
Neutrophils Relative %: 67 % (ref 43–77)
Platelets: 135 10*3/uL — ABNORMAL LOW (ref 150–400)
RBC: 4.23 MIL/uL (ref 4.22–5.81)
RDW: 13.2 % (ref 11.5–15.5)
WBC: 8.5 10*3/uL (ref 4.0–10.5)

## 2013-12-12 LAB — CBC
HCT: 36 % — ABNORMAL LOW (ref 39.0–52.0)
Hemoglobin: 12 g/dL — ABNORMAL LOW (ref 13.0–17.0)
MCH: 30.2 pg (ref 26.0–34.0)
MCHC: 33.3 g/dL (ref 30.0–36.0)
MCV: 90.7 fL (ref 78.0–100.0)
Platelets: 111 10*3/uL — ABNORMAL LOW (ref 150–400)
RBC: 3.97 MIL/uL — ABNORMAL LOW (ref 4.22–5.81)
RDW: 13 % (ref 11.5–15.5)
WBC: 6.4 10*3/uL (ref 4.0–10.5)

## 2013-12-12 LAB — TSH: TSH: 2.01 u[IU]/mL (ref 0.350–4.500)

## 2013-12-12 LAB — MAGNESIUM: MAGNESIUM: 1.9 mg/dL (ref 1.5–2.5)

## 2013-12-12 LAB — COMPREHENSIVE METABOLIC PANEL
ALT: 6 U/L (ref 0–53)
AST: 14 U/L (ref 0–37)
Albumin: 3.1 g/dL — ABNORMAL LOW (ref 3.5–5.2)
Alkaline Phosphatase: 98 U/L (ref 39–117)
Anion gap: 13 (ref 5–15)
BILIRUBIN TOTAL: 0.5 mg/dL (ref 0.3–1.2)
BUN: 17 mg/dL (ref 6–23)
CO2: 26 meq/L (ref 19–32)
CREATININE: 0.89 mg/dL (ref 0.50–1.35)
Calcium: 9.6 mg/dL (ref 8.4–10.5)
Chloride: 100 mEq/L (ref 96–112)
GFR calc Af Amer: >90 mL/min (ref >90)
GFR calc non Af Amer: 80 mL/min — ABNORMAL LOW (ref >90)
GLUCOSE: 88 mg/dL (ref 70–99)
Potassium: 4.1 mEq/L (ref 3.7–5.3)
Sodium: 139 mEq/L (ref 137–147)
Total Protein: 6.9 g/dL (ref 6.0–8.3)

## 2013-12-12 LAB — VALPROIC ACID LEVEL: Valproic Acid Lvl: 57.6 ug/mL (ref 50.0–100.0)

## 2013-12-12 LAB — URINALYSIS, ROUTINE W REFLEX MICROSCOPIC
Glucose, UA: NEGATIVE mg/dL
Hgb urine dipstick: NEGATIVE
Ketones, ur: 15 mg/dL — AB
Leukocytes, UA: NEGATIVE
Nitrite: NEGATIVE
PH: 6 (ref 5.0–8.0)
Protein, ur: NEGATIVE mg/dL
SPECIFIC GRAVITY, URINE: 1.03 (ref 1.005–1.030)
Urobilinogen, UA: 1 mg/dL (ref 0.0–1.0)

## 2013-12-12 LAB — CREATININE, SERUM
Creatinine, Ser: 0.86 mg/dL (ref 0.50–1.35)
GFR calc Af Amer: >90 mL/min (ref >90)
GFR calc non Af Amer: 81 mL/min — ABNORMAL LOW (ref >90)

## 2013-12-12 LAB — VITAMIN B12: Vitamin B-12: 705 pg/mL (ref 211–911)

## 2013-12-12 LAB — TROPONIN I: Troponin I: <0.3 ng/mL (ref <0.30)

## 2013-12-12 LAB — LIPASE, BLOOD: Lipase: 23 U/L (ref 11–59)

## 2013-12-12 LAB — RPR

## 2013-12-12 LAB — LACTIC ACID, PLASMA: Lactic Acid, Venous: 1.1 mmol/L (ref 0.5–2.2)

## 2013-12-12 LAB — MRSA PCR SCREENING: MRSA BY PCR: POSITIVE — AB

## 2013-12-12 MED ORDER — DIVALPROEX SODIUM 125 MG PO CPSP
500.0000 mg | ORAL_CAPSULE | Freq: Two times a day (BID) | ORAL | Status: DC
  Administered 2013-12-12 – 2013-12-16 (×6): 500 mg via ORAL
  Filled 2013-12-12 (×11): qty 4

## 2013-12-12 MED ORDER — FLEET ENEMA 7-19 GM/118ML RE ENEM: 1.0000 | ENEMA | Freq: Once | RECTAL | Status: AC | PRN

## 2013-12-12 MED ORDER — IPRATROPIUM BROMIDE 0.02 % IN SOLN: 0.5000 mg | RESPIRATORY_TRACT | Status: DC | PRN

## 2013-12-12 MED ORDER — DOCUSATE SODIUM 100 MG PO CAPS
100.0000 mg | ORAL_CAPSULE | Freq: Two times a day (BID) | ORAL | Status: DC
  Administered 2013-12-12 – 2013-12-16 (×6): 100 mg via ORAL
  Filled 2013-12-12 (×10): qty 1

## 2013-12-12 MED ORDER — ALBUTEROL SULFATE (2.5 MG/3ML) 0.083% IN NEBU: 2.5000 mg | INHALATION_SOLUTION | RESPIRATORY_TRACT | Status: DC | PRN

## 2013-12-12 MED ORDER — CHLORHEXIDINE GLUCONATE CLOTH 2 % EX PADS
6.0000 | MEDICATED_PAD | Freq: Every day | CUTANEOUS | Status: DC
  Administered 2013-12-13 – 2013-12-14 (×2): 6 via TOPICAL

## 2013-12-12 MED ORDER — PANTOPRAZOLE SODIUM 40 MG PO TBEC
80.0000 mg | DELAYED_RELEASE_TABLET | Freq: Two times a day (BID) | ORAL | Status: DC
  Administered 2013-12-12 – 2013-12-16 (×4): 80 mg via ORAL
  Filled 2013-12-12 (×9): qty 2

## 2013-12-12 MED ORDER — ASPIRIN EC 81 MG PO TBEC
81.0000 mg | DELAYED_RELEASE_TABLET | Freq: Every day | ORAL | Status: DC
  Administered 2013-12-12 – 2013-12-16 (×4): 81 mg via ORAL
  Filled 2013-12-12 (×5): qty 1

## 2013-12-12 MED ORDER — LACTINEX PO CHEW
1.0000 | CHEWABLE_TABLET | Freq: Every day | ORAL | Status: DC
  Administered 2013-12-12 – 2013-12-16 (×3): 1 via ORAL
  Filled 2013-12-12 (×6): qty 1

## 2013-12-12 MED ORDER — ACETAMINOPHEN 650 MG RE SUPP: 650.0000 mg | Freq: Four times a day (QID) | RECTAL | Status: DC | PRN

## 2013-12-12 MED ORDER — TRAMADOL HCL 50 MG PO TABS
50.0000 mg | ORAL_TABLET | Freq: Three times a day (TID) | ORAL | Status: DC | PRN
  Administered 2013-12-12: 50 mg via ORAL
  Filled 2013-12-12: qty 1

## 2013-12-12 MED ORDER — POTASSIUM CHLORIDE 20 MEQ/15ML (10%) PO LIQD
20.0000 meq | Freq: Every day | ORAL | Status: DC
  Administered 2013-12-12 – 2013-12-16 (×2): 20 meq via ORAL
  Filled 2013-12-12 (×5): qty 15

## 2013-12-12 MED ORDER — LORAZEPAM 0.5 MG PO TABS
0.5000 mg | ORAL_TABLET | Freq: Two times a day (BID) | ORAL | Status: DC
  Administered 2013-12-12: 0.5 mg via ORAL
  Filled 2013-12-12: qty 1

## 2013-12-12 MED ORDER — GI COCKTAIL ~~LOC~~
30.0000 mL | Freq: Three times a day (TID) | ORAL | Status: DC | PRN
  Filled 2013-12-12: qty 30

## 2013-12-12 MED ORDER — MEMANTINE HCL 10 MG PO TABS
10.0000 mg | ORAL_TABLET | Freq: Two times a day (BID) | ORAL | Status: DC
  Administered 2013-12-12 – 2013-12-16 (×9): 10 mg via ORAL
  Filled 2013-12-12 (×11): qty 1

## 2013-12-12 MED ORDER — ACETAMINOPHEN 325 MG PO TABS
650.0000 mg | ORAL_TABLET | Freq: Four times a day (QID) | ORAL | Status: DC | PRN
  Administered 2013-12-13 – 2013-12-15 (×2): 650 mg via ORAL
  Filled 2013-12-12 (×5): qty 2

## 2013-12-12 MED ORDER — ENOXAPARIN SODIUM 40 MG/0.4ML ~~LOC~~ SOLN
40.0000 mg | SUBCUTANEOUS | Status: DC
  Administered 2013-12-12 – 2013-12-14 (×3): 40 mg via SUBCUTANEOUS
  Filled 2013-12-12 (×5): qty 0.4

## 2013-12-12 MED ORDER — ONDANSETRON HCL 4 MG PO TABS: 4.0000 mg | ORAL_TABLET | Freq: Four times a day (QID) | ORAL | Status: DC | PRN

## 2013-12-12 MED ORDER — LORAZEPAM 2 MG/ML IJ SOLN
0.5000 mg | Freq: Once | INTRAMUSCULAR | Status: AC
  Administered 2013-12-12: 0.5 mg via INTRAVENOUS
  Filled 2013-12-12: qty 1

## 2013-12-12 MED ORDER — HALOPERIDOL LACTATE 5 MG/ML IJ SOLN
2.0000 mg | Freq: Four times a day (QID) | INTRAMUSCULAR | Status: DC | PRN
  Administered 2013-12-12 – 2013-12-15 (×5): 2 mg via INTRAMUSCULAR
  Filled 2013-12-12 (×5): qty 1

## 2013-12-12 MED ORDER — QUETIAPINE FUMARATE 50 MG PO TABS
50.0000 mg | ORAL_TABLET | Freq: Every day | ORAL | Status: DC
  Administered 2013-12-12: 50 mg via ORAL
  Filled 2013-12-12 (×2): qty 1

## 2013-12-12 MED ORDER — LISINOPRIL 5 MG PO TABS
5.0000 mg | ORAL_TABLET | Freq: Every day | ORAL | Status: DC
  Administered 2013-12-12 – 2013-12-14 (×3): 5 mg via ORAL
  Filled 2013-12-12 (×3): qty 1

## 2013-12-12 MED ORDER — BISACODYL 10 MG RE SUPP: 10.0000 mg | Freq: Every day | RECTAL | Status: DC | PRN

## 2013-12-12 MED ORDER — TRAZODONE HCL 50 MG PO TABS
25.0000 mg | ORAL_TABLET | Freq: Every evening | ORAL | Status: DC | PRN
  Administered 2013-12-12: 25 mg via ORAL
  Filled 2013-12-12: qty 1

## 2013-12-12 MED ORDER — SODIUM CHLORIDE 0.9 % IV SOLN
INTRAVENOUS | Status: DC
  Administered 2013-12-12 (×2): via INTRAVENOUS
  Administered 2013-12-13: 75 mL/h via INTRAVENOUS
  Administered 2013-12-14 – 2013-12-16 (×5): via INTRAVENOUS

## 2013-12-12 MED ORDER — SODIUM CHLORIDE 0.9 % IV BOLUS (SEPSIS)
1000.0000 mL | Freq: Once | INTRAVENOUS | Status: AC
  Administered 2013-12-12: 1000 mL via INTRAVENOUS

## 2013-12-12 MED ORDER — ONDANSETRON HCL 4 MG/2ML IJ SOLN: 4.0000 mg | Freq: Four times a day (QID) | INTRAMUSCULAR | Status: DC | PRN

## 2013-12-12 MED ORDER — MAGNESIUM HYDROXIDE 400 MG/5ML PO SUSP: 30.0000 mL | Freq: Every day | ORAL | Status: DC | PRN

## 2013-12-12 MED ORDER — KETOROLAC TROMETHAMINE 30 MG/ML IJ SOLN
30.0000 mg | Freq: Once | INTRAMUSCULAR | Status: AC
  Administered 2013-12-12: 30 mg via INTRAVENOUS
  Filled 2013-12-12: qty 1

## 2013-12-12 MED ORDER — KETOROLAC TROMETHAMINE 30 MG/ML IJ SOLN
15.0000 mg | Freq: Four times a day (QID) | INTRAMUSCULAR | Status: DC | PRN
  Administered 2013-12-15: 15 mg via INTRAVENOUS
  Filled 2013-12-12 (×2): qty 1

## 2013-12-12 MED ORDER — METOPROLOL TARTRATE 50 MG PO TABS
50.0000 mg | ORAL_TABLET | Freq: Two times a day (BID) | ORAL | Status: DC
  Administered 2013-12-12 – 2013-12-16 (×9): 50 mg via ORAL
  Filled 2013-12-12 (×10): qty 1

## 2013-12-12 MED ORDER — POLYETHYLENE GLYCOL 3350 17 G PO PACK
17.0000 g | PACK | Freq: Every day | ORAL | Status: DC | PRN
  Filled 2013-12-12: qty 1

## 2013-12-12 MED ORDER — TIOTROPIUM BROMIDE MONOHYDRATE 18 MCG IN CAPS
18.0000 ug | ORAL_CAPSULE | Freq: Every day | RESPIRATORY_TRACT | Status: DC
  Administered 2013-12-13 – 2013-12-15 (×2): 18 ug via RESPIRATORY_TRACT
  Filled 2013-12-12: qty 5

## 2013-12-12 MED ORDER — SODIUM CHLORIDE 0.9 % IJ SOLN
3.0000 mL | Freq: Two times a day (BID) | INTRAMUSCULAR | Status: DC
  Administered 2013-12-12 – 2013-12-13 (×3): 3 mL via INTRAVENOUS

## 2013-12-12 MED ORDER — MUPIROCIN 2 % EX OINT
1.0000 "application " | TOPICAL_OINTMENT | Freq: Two times a day (BID) | CUTANEOUS | Status: DC
  Administered 2013-12-12 – 2013-12-15 (×8): 1 via NASAL
  Filled 2013-12-12: qty 22

## 2013-12-12 MED ORDER — SERTRALINE HCL 50 MG PO TABS
50.0000 mg | ORAL_TABLET | Freq: Every day | ORAL | Status: DC
  Administered 2013-12-12: 50 mg via ORAL
  Filled 2013-12-12 (×2): qty 1

## 2013-12-12 NOTE — Evaluation (Signed)
SLP Cancellation Note  Patient Details Name: Jerry Kerr MRN: [id number] DOB: [birth year]   Cancelled treatment:       Reason Eval/Treat Not Completed: Medical issues which prohibited therapy;Patient's level of consciousness (pt not alert enough for po intake or eval, open mouth breathing posture and not wakening for po)  Luanna Salk, [place] [facility] SLP [phone]

## 2013-12-12 NOTE — Evaluation (Addendum)
Clinical/Bedside Swallow Evaluation Patient Details  Name: Jerry Kerr MRN: [id number] Date of Birth: [birth year]  Today's Date: 12/12/2013 Time: 28-1540 SLP Time Calculation (min): 50 min  Past Medical History:  Past Medical History  Diagnosis Date  . Asthma   . Dementia   . Back pain   . COPD (chronic obstructive pulmonary disease)   . Hypertension   . Pacemaker   . Anxiety   . Depression   . Cancer     SKIN, PROSTATE  . GERD (gastroesophageal reflux disease)   . Substance abuse     ETOH  . Dysphagia, unspecified(787.20) 12/12/2013   Past Surgical History:  Past Surgical History  Procedure Laterality Date  . Neck surgery    . Upper gastrointestinal endoscopy    . Colonoscopy    . Ankle surgery    . Skin graft    . Pacemaker insertion    . Tonsillectomy     HPI:  80 yo male adm to [facility] with AMS, recent falls, hallucinations.  Pt with PMH of ETOH use, COPD, Barrett's esophagus, reflux, hiatal hernia s/p esophageal diliation. Pt also with h/o ACDF with hardware from C3-C7 and concern was present for injury with this admit.  2 MBS studies completed within the last month with dys3/nectar diet being recommended with consideration for the frazier water protocol during most recent testing.  Chin tuck posture was also recommended.  Pt has not been consuming thickened liquids consistently and has not been conducting chin tuck. He mostly consumes milkshakes and milk per daughter/pt.  CXR currently is negative.  Swallow evaluation ordered.    Assessment / Plan / Recommendation Clinical Impression  Pt has h/o multifactorial chronic dysphagia that may be exacerbated with this current event.  Pt/daughter report coughing/throat clearing with intake is normal and there are no changes in swallow ability currently (pt now fully alert).  Throat clearing, cough and multiple swallows noted across consistencies - without improvement clinically with thickened drinks.  SLP did not attempt chin  tuck due to concern for ACDF/fall and pt has not been conducting posture at home.  Pt's cough appeared relatively strong although not productive.  Pt is impulsive and takes large cup boluses, as he reports it's easier to swallow when questioned.  He did not follow directions to take small boluses and his cognitive issues likely impacts pt's ability to follow detailed directions.    SLP educated daughter Jerry Kerr and pt to findings and recommendations in great detail.  Advised Kitty to SLP role to educate re: strategies to increase comfort/decrease aspiration - not prevent it.  Educated to water being safest liquid to aspirate - although pt does not like water.   As no clinical improvement noted with thicker drinks and CXR negative, recommend continue thin liquid *water preferred.   Informed both to suspicion of chronic low grade aspiration that pt has tolerated as of now.  Educated Kitty to multiple risk factors for aspiration pneumonia - not just dysphagia.  As chronic medical issues progress, he will likely have increased aspiration pneumonia risk .     Will follow up for pt/family education/tolerance.      Aspiration Risk  Moderate (chronic)    Diet Recommendation Dysphagia 3 (Mechanical Soft);Thin liquid (prefer pt consume water)   Liquid Administration via: Cup;No straw (no ice in drinks) Medication Administration: Whole meds with puree Supervision: Patient able to self feed Compensations: Slow rate;Small sips/bites (clear throat/cough strongly as needed, rest if dyspneic) Postural Changes and/or Swallow Maneuvers: Seated upright  90 degrees;Upright 30-60 min after meal    Other  Recommendations Oral Care Recommendations: Oral care BID   Follow Up Recommendations    TBD   Frequency and Duration min 1 x/week  1 week   Pertinent Vitals/Pain Afebrile, decreased     Swallow Study Prior Functional Status   see HHX, pt reportedly feeds himself prior to admit and has some coughing with  intake    General Date of Onset: 12/12/13 HPI: 80 yo male adm to [facility] with AMS, recent falls, hallucinations.  Pt with PMH of ETOH use, COPD, Barrett's esophagus, reflux, hiatal hernia s/p esophageal diliation. Pt also with h/o ACDF with hardware from C3-C7 and concern was present for injury with this admit.  2 MBS studies completed within the last month with dys3/nectar diet being recommended with consideration for the frazier water protocol during most recent testing.  Chin tuck posture was also recommended.  Pt has not been consuming thickened liquids consistently and has not been conducting chin tuck. He mostly consumes milkshakes and milk per daughter/pt.  CXR currently is negative.  Swallow evaluation ordered.  Type of Study: Bedside swallow evaluation Previous Swallow Assessment: MBS 2 studies in August 2015 Diet Prior to this Study: Dysphagia 3 (soft);Thin liquids Temperature Spikes Noted: No Respiratory Status: Room air History of Recent Intubation: No Behavior/Cognition: Alert;Distractible;Cooperative (can become agitated fairly easily) Oral Cavity - Dentition: Adequate natural dentition Self-Feeding Abilities: Able to feed self Patient Positioning: Upright in bed Baseline Vocal Quality: Clear Volitional Cough: Strong Volitional Swallow: Able to elicit    Oral/Motor/Sensory Function Overall Oral Motor/Sensory Function:  (generalized weakness, sluggish palatal elevation but bilateral) Facial Symmetry: Right droop Facial Strength: Reduced   Ice Chips Ice chips: Within functional limits Presentation: Spoon   Thin Liquid Thin Liquid: Impaired Presentation: Cup;Self Fed Pharyngeal  Phase Impairments: Suspected delayed Swallow;Decreased hyoid-laryngeal movement;Multiple swallows;Throat Clearing - Immediate;Cough - Immediate    Nectar Thick Nectar Thick Liquid: Impaired Pharyngeal Phase Impairments: Suspected delayed Swallow;Decreased hyoid-laryngeal movement;Multiple swallows;Cough -  Delayed;Throat Clearing - Immediate   Honey Thick Honey Thick Liquid: Not tested   Puree Puree: Impaired Presentation: Self Fed;Spoon Pharyngeal Phase Impairments: Suspected delayed Swallow;Decreased hyoid-laryngeal movement;Multiple swallows   Solid   GO    Solid: Impaired Presentation: Self Fed Pharyngeal Phase Impairments: Suspected delayed Swallow;Decreased hyoid-laryngeal movement;Multiple swallows;Cough - Delayed       Luanna Salk, [place] [facility] SLP [phone]

## 2013-12-12 NOTE — ED Provider Notes (Signed)
CSN: [id number]     Arrival date & time 12/11/13  2307 History   First MD Initiated Contact with Patient 12/11/13 2358     Chief Complaint  Patient presents with  . Altered Mental Status     (Consider location/radiation/quality/duration/timing/severity/associated sxs/prior Treatment) HPI  Jerry Kerr is a 80 y.o. male with past medical history of dementia and Alzheimer's coming in with altered mental status. History was obtained by daughter. She states that he fell 2 days ago it was unwitnessed but has not been evaluated. Also fell yesterday. Since then he has been more confused, with slurred speech, and she describes a heavy breathing. She states his chronic back pain is acting up again as well. Patient lives at [facility] and has 24-hour care. Remainder of history is unable to obtain by patient regarding fevers, chills or recent infections due to his history of dementia.   Past Medical History  Diagnosis Date  . Asthma   . Dementia   . Back pain   . COPD (chronic obstructive pulmonary disease)   . Hypertension   . Pacemaker   . Anxiety   . Depression   . Cancer     SKIN, PROSTATE  . GERD (gastroesophageal reflux disease)   . Substance abuse     ETOH   Past Surgical History  Procedure Laterality Date  . Neck surgery    . Upper gastrointestinal endoscopy    . Colonoscopy    . Ankle surgery    . Skin graft    . Pacemaker insertion    . Tonsillectomy     Family History  Problem Relation Age of Onset  . CAD Father   . Heart disease Father   . Hypertension Mother   . AAA (abdominal aortic aneurysm) Mother   . Heart disease Mother    History  Substance Use Topics  . 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 Smokeless tobacco: Never Used  . Alcohol Use: No    Review of Systems  Unable to perform ROS: Dementia      Allergies  Review of patient's allergies indicates no known allergies.  Home Medications   Prior to Admission medications   Medication Sig  Start Date End Date Taking? Authorizing Provider  acetaminophen (TYLENOL) 500 MG tablet Take 500 mg by mouth every 8 (eight) hours as needed for moderate pain.    Yes Historical Provider, MD  albuterol (PROAIR HFA) 108 (90 BASE) MCG/ACT inhaler Inhale 2 puffs into the lungs 4 (four) times daily as needed for wheezing or shortness of breath.    Yes Historical Provider, MD  alum & mag hydroxide-simeth (MAALOX ADVANCED) 200-200-20 MG/5ML suspension Take 30 mLs by mouth every 6 (six) hours as needed for indigestion or heartburn.   Yes Historical Provider, MD  divalproex (DEPAKOTE SPRINKLE) 125 MG capsule Take 500 mg by mouth 2 (two) times daily.   Yes Historical Provider, MD  Lactobacillus Rhamnosus, GG, (CULTURELLE KIDS) PACK Take 1 packet by mouth daily.   Yes Historical Provider, MD  lisinopril (PRINIVIL,ZESTRIL) 5 MG tablet Take 5 mg by mouth daily.   Yes Historical Provider, MD  LORazepam (ATIVAN) 0.5 MG tablet Take 0.5 mg by mouth 2 (two) times daily.    Yes Historical Provider, MD  magnesium hydroxide (MILK OF MAGNESIA) 400 MG/5ML suspension Take 30 mLs by mouth daily as needed for mild constipation.   Yes Historical Provider, MD  memantine (NAMENDA) 10 MG tablet Take 10 mg by mouth 2 (two) times daily.  Yes Historical Provider, MD  metoprolol (LOPRESSOR) 50 MG tablet Take 50 mg by mouth 2 (two) times daily.   Yes Historical Provider, MD  omeprazole (PRILOSEC) 40 MG capsule Take 1 capsule (40 mg total) by mouth 2 (two) times daily. 11/14/13  Yes Jerene Bears, MD  potassium chloride 20 MEQ/15ML (10%) solution Take 20 mEq by mouth daily.   Yes Historical Provider, MD  QUEtiapine (SEROQUEL) 50 MG tablet Take 50 mg by mouth at bedtime.   Yes Historical Provider, MD  sertraline (ZOLOFT) 50 MG tablet Take 50 mg by mouth daily.   Yes Historical Provider, MD  traMADol (ULTRAM) 50 MG tablet Take 50 mg by mouth 3 (three) times daily as needed (for pain).   Yes Historical Provider, MD  traZODone (DESYREL) 50  MG tablet Take 25 mg by mouth at bedtime as needed for sleep.    Yes Historical Provider, MD  aspirin EC 81 MG tablet Take 81 mg by mouth daily.    Historical Provider, MD  tiotropium (SPIRIVA) 18 MCG inhalation capsule Place 18 mcg into inhaler and inhale daily.    Historical Provider, MD   BP 130/70  Pulse 61  Temp(Src) 98.8 F (37.1 C) (Rectal)  Resp 22  SpO2 100% Physical Exam  Nursing note and vitals reviewed. Constitutional: He is oriented to person, place, and time. Vital signs are normal. He appears well-developed and well-nourished.  Non-toxic appearance. He does not appear ill. No distress.  HENT:  Head: Normocephalic and atraumatic.  Nose: Nose normal.  Mouth/Throat: Oropharynx is clear and moist. No oropharyngeal exudate.  Eyes: Conjunctivae and EOM are normal. Pupils are equal, round, and reactive to light. No scleral icterus.  Neck: Normal range of motion. Neck supple. No tracheal deviation, no edema, no erythema and normal range of motion present. No mass and no thyromegaly present.  Cardiovascular: Normal rate, regular rhythm, S1 normal, S2 normal, normal heart sounds, intact distal pulses and normal pulses.  Exam reveals no gallop and no friction rub.   No murmur heard. Pulses:      Radial pulses are 2+ on the right side, and 2+ on the left side.       Dorsalis pedis pulses are 2+ on the right side, and 2+ on the left side.  Pulmonary/Chest: Breath sounds normal. No respiratory distress. He has no wheezes. He has no rhonchi. He has no rales.  Tachypnea and increased work of breathing.  Abdominal: Soft. Normal appearance and bowel sounds are normal. He exhibits no distension, no ascites and no mass. There is no hepatosplenomegaly. There is no tenderness. There is no rebound, no guarding and no CVA tenderness.  Musculoskeletal: Normal range of motion. He exhibits no edema and no tenderness.  Lumbar spinal tenderness to palpation.  Lymphadenopathy:    He has no cervical  adenopathy.  Neurological: He is alert and oriented to person, place, and time. He has normal strength. No cranial nerve deficit or sensory deficit. GCS eye subscore is 4. GCS verbal subscore is 5. GCS motor subscore is 6.  Skin: Skin is warm, dry and intact. No petechiae and no rash noted. He is not diaphoretic. No erythema. No pallor.  Psychiatric: He has a normal mood and affect. His behavior is normal. Judgment normal.    ED Course  Procedures (including critical care time) Labs Review Labs Reviewed  CBC WITH DIFFERENTIAL - Abnormal; Notable for the following:    Hemoglobin 12.6 (*)    HCT 38.1 (*)  Platelets 135 (*)    All other components within normal limits  COMPREHENSIVE METABOLIC PANEL - Abnormal; Notable for the following:    Albumin 3.1 (*)    GFR calc non Af Amer 80 (*)    All other components within normal limits  URINALYSIS, ROUTINE W REFLEX MICROSCOPIC - Abnormal; Notable for the following:    Color, Urine AMBER (*)    Bilirubin Urine SMALL (*)    Ketones, ur 15 (*)    All other components within normal limits  URINE CULTURE  LACTIC ACID, PLASMA  LIPASE, BLOOD  TROPONIN I    Imaging Review Dg Chest 2 View  12/12/2013   CLINICAL DATA:  Altered mental status.  EXAM: CHEST  2 VIEW  COMPARISON:  Chest radiograph performed 11/07/2013  FINDINGS: The lungs are well-aerated and clear. There is no evidence of focal opacification, pleural effusion or pneumothorax. There is mild elevation of the left hemidiaphragm.  The heart is normal in size; a pacemaker is noted at the left chest wall, with leads ending at the right atrium and right ventricle. No acute osseous abnormalities are seen. Cervical spinal fusion hardware is partially imaged.  IMPRESSION: Mild elevation of the left hemidiaphragm. No acute cardiopulmonary process seen.   Electronically Signed   By: Garald Balding M.D.   On: 12/12/2013 04:34   Dg Lumbar Spine Complete  12/12/2013   CLINICAL DATA:  Multiple falls,  with lower back pain.  EXAM: LUMBAR SPINE - COMPLETE 4+ VIEW  COMPARISON:  Lumbar spine radiographs performed 11/11/2013  FINDINGS: There is relatively stable compression deformity of vertebral body L1, and new very mild compression deformity involving the superior endplate of L2. This may be acute in nature. There is mildly worsened narrowing of the intervertebral disc space at L3-L4. Mild grade 1 retrolisthesis of L2 on L3 and of L3 on L4 appears stable from the prior study. Facet disease is noted at the lower lumbar spine.  The visualized bowel gas pattern is unremarkable in appearance; air and stool are noted within the colon. The sacroiliac joints are within normal limits.  IMPRESSION: 1. New very mild compression deformity involving the superior endplate of L2. Given the recent nature of the prior study, this may reflect an acute injury. No evidence of retropulsion. This involves only a portion of the superior endplate. 2. Stable chronic compression deformity at L1. Mild degenerative change noted along the lumbar spine.   Electronically Signed   By: Garald Balding M.D.   On: 12/12/2013 01:26   Ct Head Wo Contrast  12/12/2013   CLINICAL DATA:  Multiple falls. Altered mental status. Concern for cervical spine injury.  EXAM: CT HEAD WITHOUT CONTRAST  CT CERVICAL SPINE WITHOUT CONTRAST  TECHNIQUE: Multidetector CT imaging of the head and cervical spine was performed following the standard protocol without intravenous contrast. Multiplanar CT image reconstructions of the cervical spine were also generated.  COMPARISON:  CT of the head performed 11/07/2013, and CT of the neck performed 11/08/2013  FINDINGS: CT HEAD FINDINGS  There is no evidence of acute infarction, mass lesion, or intra- or extra-axial hemorrhage on CT.  Prominence of the ventricles and sulci reflects mild cortical volume loss. Periventricular white matter change likely reflects small vessel ischemic microangiopathy. Cerebellar atrophy is noted.   The brainstem and fourth ventricle are within normal limits. The basal ganglia are unremarkable in appearance. The cerebral hemispheres demonstrate grossly normal gray-white differentiation. No mass effect or midline shift is seen.  There is no  evidence of fracture; visualized osseous structures are unremarkable in appearance. The orbits are within normal limits. The paranasal sinuses and mastoid air cells are well-aerated. No significant soft tissue abnormalities are seen.  CT CERVICAL SPINE FINDINGS  There is no evidence of fracture or subluxation. Evaluation of the dens is mildly suboptimal due to motion artifact.  The patient is status post anterior cervical spinal fusion at C3-C5, and posterior cervical spinal fusion at C3-C7. There is minimal grade 1 anterolisthesis of C7 on T1, likely reflecting chronic degenerative change. Vertebral bodies demonstrate normal height. Prevertebral soft tissues are within normal limits.  The thyroid gland is unremarkable in appearance. The visualized lung apices are clear. Calcification is noted at the carotid bifurcations bilaterally.  IMPRESSION: 1. No evidence of traumatic intracranial injury or fracture. 2. No evidence of fracture or subluxation along the cervical spine. 3. Mild cortical volume loss and scattered small vessel ischemic microangiopathy. 4. Postoperative change along the cervical spine. Minimal associated degenerative change seen. 5. Calcification at the carotid bifurcations bilaterally. Carotid ultrasound would be helpful for further evaluation, when and as deemed clinically appropriate.   Electronically Signed   By: Garald Balding M.D.   On: 12/12/2013 01:17   Ct Cervical Spine Wo Contrast  12/12/2013   CLINICAL DATA:  Multiple falls. Altered mental status. Concern for cervical spine injury.  EXAM: CT HEAD WITHOUT CONTRAST  CT CERVICAL SPINE WITHOUT CONTRAST  TECHNIQUE: Multidetector CT imaging of the head and cervical spine was performed following the  standard protocol without intravenous contrast. Multiplanar CT image reconstructions of the cervical spine were also generated.  COMPARISON:  CT of the head performed 11/07/2013, and CT of the neck performed 11/08/2013  FINDINGS: CT HEAD FINDINGS  There is no evidence of acute infarction, mass lesion, or intra- or extra-axial hemorrhage on CT.  Prominence of the ventricles and sulci reflects mild cortical volume loss. Periventricular white matter change likely reflects small vessel ischemic microangiopathy. Cerebellar atrophy is noted.  The brainstem and fourth ventricle are within normal limits. The basal ganglia are unremarkable in appearance. The cerebral hemispheres demonstrate grossly normal gray-white differentiation. No mass effect or midline shift is seen.  There is no evidence of fracture; visualized osseous structures are unremarkable in appearance. The orbits are within normal limits. The paranasal sinuses and mastoid air cells are well-aerated. No significant soft tissue abnormalities are seen.  CT CERVICAL SPINE FINDINGS  There is no evidence of fracture or subluxation. Evaluation of the dens is mildly suboptimal due to motion artifact.  The patient is status post anterior cervical spinal fusion at C3-C5, and posterior cervical spinal fusion at C3-C7. There is minimal grade 1 anterolisthesis of C7 on T1, likely reflecting chronic degenerative change. Vertebral bodies demonstrate normal height. Prevertebral soft tissues are within normal limits.  The thyroid gland is unremarkable in appearance. The visualized lung apices are clear. Calcification is noted at the carotid bifurcations bilaterally.  IMPRESSION: 1. No evidence of traumatic intracranial injury or fracture. 2. No evidence of fracture or subluxation along the cervical spine. 3. Mild cortical volume loss and scattered small vessel ischemic microangiopathy. 4. Postoperative change along the cervical spine. Minimal associated degenerative change  seen. 5. Calcification at the carotid bifurcations bilaterally. Carotid ultrasound would be helpful for further evaluation, when and as deemed clinically appropriate.   Electronically Signed   By: Garald Balding M.D.   On: 12/12/2013 01:17     EKG Interpretation   Date/Time:  Monday December 12 2013 00:25:50 EDT  Ventricular Rate:  60 PR Interval:  189 QRS Duration: 91 QT Interval:  430 QTC Calculation: 430 R Axis:   79 Text Interpretation:  Atrial-paced rhythm Probable anteroseptal infarct,  old Nonspecific T abnormalities, lateral leads Confirmed by Glynn Octave [phone]) on 12/12/2013 6:38:03 AM      MDM   Final diagnoses:  None    She presents today a concern for confusion and altered mental status. Will obtain CT of head for any intracranial abnormality. Also will do an infectious workup. Anticipate admission.  X-rays reveal a new small compression fracture of the L2 spine. Patient was given Toradol for treatment as his daughter states that narcotics make him psychotic. Patient is a liter of IV fluids as well. Infectious workup was overall negative. CT head does not reveal any intracranial bleeding. I am unsure of the cause of his altered mental status, the patient be retained in the hospital for further evaluation. I do not believe this is progression of his dementia as his symptoms abruptly started 2 days ago. Patient did receive his home dose of 0.5 mg Ativan at night.     Everlene Balls, MD 12/12/13 [phone]

## 2013-12-12 NOTE — ED Notes (Signed)
Report given to 4th floor RN, however they are asking to verify telemetry orders prior to transferring pt.

## 2013-12-12 NOTE — ED Notes (Signed)
Pt sleeping at this time, do not want to wake pt at this time to offer bathroom assistance

## 2013-12-12 NOTE — Progress Notes (Signed)
Pt's daughter thinks that the reflux med pt is taking is making him confused. She wants to hold the Protonix for now and will address to MD in am.

## 2013-12-12 NOTE — Progress Notes (Signed)
PT Cancellation Note  Patient Details Name: Jerry Kerr MRN: [id number] DOB: 1934-02-24   Cancelled Treatment:    Reason Eval/Treat Not Completed: Fatigue/lethargy limiting ability to participate (per RN, pt not awake/alert enough to participate)   Diana Davenport,KATHrine E 12/12/2013, 1:41 PM Carmelia Bake, PT, DPT 12/12/2013 Pager: [phone]

## 2013-12-12 NOTE — ED Notes (Signed)
First attempt to give report to floor: RN unavailable, they will call back.

## 2013-12-12 NOTE — H&P (Signed)
[facility] History and Physical  Jerry Kerr ZOX:[id number] DOB: January 08, 1934 DOA: 12/11/2013  Referring physician: Dr. Claudine Mouton 12/12/2013 PCP: Jerene Bears, MD   Chief Complaint: Altered mental status  HPI: Jerry Kerr is a 80 y.o. male  With history of Alzheimer's dementia, COPD, hypertension, anxiety and depression, prior history of substance abuse, gastroesophageal reflux disease, Barrett's esophagus who lives at [facility] home and has 24-hour care presented to the ED with a two-day history of altered mental status and falls. Patient asleep during the interview and a such history was obtained from patient's daughter, Jerry Kerr, and per ED physician. Per daughter patient had been doing fine up until 3 days prior to admission when patient fell at night on the way to the bathroom after which he got extremely combative and was incontinent. 2 days prior to admission patient essentially stayed in bed and on his way to the bathroom fell again however refused assistance. Patient did not state how he fell in the bathroom per daughter. One day prior to admission patient had decreased oral intake was complaining of acute on chronic back pain which had worsened and was not acting himself. Daughter states patient was hallucinating and grasping things that were not there and was unable to identify his daughter. She also noted that patient had some slurred speech was difficult to understand. Patient's daughter feels that patient may have taken more than his usual dose of Depakote however is not quite sure how much more. Patient's daughter states that patient did not take his Ativan prior to his hallucinating. Patient's daughter also felt that patient's breathing might have been labored. Date the family denies any recent fevers however felt patient was warm to the touch, no chills no nausea no vomiting no chest pain no abdominal pain no dysuria no complaints of diarrhea no complaints of constipation  no melena no hematemesis no hematochezia. Patient was seen in the emergency room CT of the head and C-spine were unremarkable for any acute abnormalities. Chest x-ray was negative for any acute infiltrate. Comprehensive metabolic profile and albumin of 3.1 otherwise was unremarkable. First set of troponin was negative. Lactic acid level was 1.1. CBC had a platelet count of 135 hemoglobin of 12.6 otherwise was unremarkable. Urinalysis was nitrite negative leukocytes negative. EKG showed atrial paced rhythm. X-ray of the lumbar spine showed a new very mild compression deformity involving the superior endplate of L2. Per daughter narcotics make patient psychotic and hallucinating. We will call to admit the patient for further evaluation and management.   Review of Systems: As per history of present illness otherwise negative. Constitutional:  No weight loss, night sweats, Fevers, chills, fatigue.  HEENT:  No headaches, Difficulty swallowing,Tooth/dental problems,Sore throat,  No sneezing, itching, ear ache, nasal congestion, post nasal drip,  Cardio-vascular:  No chest pain, Orthopnea, PND, swelling in lower extremities, anasarca, dizziness, palpitations  GI:  No heartburn, indigestion, abdominal pain, nausea, vomiting, diarrhea, change in bowel habits, loss of appetite  Resp:  No shortness of breath with exertion or at rest. No excess mucus, no productive cough, No non-productive cough, No coughing up of blood.No change in color of mucus.No wheezing.No chest wall deformity  Skin:  no rash or lesions.  GU:  no dysuria, change in color of urine, no urgency or frequency. No flank pain.  Musculoskeletal:  No joint pain or swelling. No decreased range of motion. No back pain.  Psych:  No change in mood or affect. No depression or anxiety. No memory loss.  Past Medical History  Diagnosis Date  . Asthma   . Dementia   . Back pain   . COPD (chronic obstructive pulmonary disease)   .  Hypertension   . Pacemaker   . Anxiety   . Depression   . Cancer     SKIN, PROSTATE  . GERD (gastroesophageal reflux disease)   . Substance abuse     ETOH  . Dysphagia, unspecified(787.20) 12/12/2013   Past Surgical History  Procedure Laterality Date  . Neck surgery    . Upper gastrointestinal endoscopy    . Colonoscopy    . Ankle surgery    . Skin graft    . Pacemaker insertion    . Tonsillectomy     Social History:  reports that he has quit smoking. He has never used smokeless tobacco. He reports that he does not drink alcohol or use illicit drugs.  No Known Allergies  Family History  Problem Relation Age of Onset  . CAD Father   . Heart disease Father   . Hypertension Mother   . AAA (abdominal aortic aneurysm) Mother   . Heart disease Mother      Prior to Admission medications   Medication Sig Start Date End Date Taking? Authorizing Provider  acetaminophen (TYLENOL) 500 MG tablet Take 500 mg by mouth every 8 (eight) hours as needed for moderate pain.    Yes Historical Provider, MD  albuterol (PROAIR HFA) 108 (90 BASE) MCG/ACT inhaler Inhale 2 puffs into the lungs 4 (four) times daily as needed for wheezing or shortness of breath.    Yes Historical Provider, MD  alum & mag hydroxide-simeth (MAALOX ADVANCED) 200-200-20 MG/5ML suspension Take 30 mLs by mouth every 6 (six) hours as needed for indigestion or heartburn.   Yes Historical Provider, MD  divalproex (DEPAKOTE SPRINKLE) 125 MG capsule Take 500 mg by mouth 2 (two) times daily.   Yes Historical Provider, MD  Lactobacillus Rhamnosus, GG, (CULTURELLE KIDS) PACK Take 1 packet by mouth daily.   Yes Historical Provider, MD  lisinopril (PRINIVIL,ZESTRIL) 5 MG tablet Take 5 mg by mouth daily.   Yes Historical Provider, MD  LORazepam (ATIVAN) 0.5 MG tablet Take 0.5 mg by mouth 2 (two) times daily.    Yes Historical Provider, MD  magnesium hydroxide (MILK OF MAGNESIA) 400 MG/5ML suspension Take 30 mLs by mouth daily as needed  for mild constipation.   Yes Historical Provider, MD  memantine (NAMENDA) 10 MG tablet Take 10 mg by mouth 2 (two) times daily.   Yes Historical Provider, MD  metoprolol (LOPRESSOR) 50 MG tablet Take 50 mg by mouth 2 (two) times daily.   Yes Historical Provider, MD  omeprazole (PRILOSEC) 40 MG capsule Take 1 capsule (40 mg total) by mouth 2 (two) times daily. 11/14/13  Yes Jerene Bears, MD  potassium chloride 20 MEQ/15ML (10%) solution Take 20 mEq by mouth daily.   Yes Historical Provider, MD  QUEtiapine (SEROQUEL) 50 MG tablet Take 50 mg by mouth at bedtime.   Yes Historical Provider, MD  sertraline (ZOLOFT) 50 MG tablet Take 50 mg by mouth daily.   Yes Historical Provider, MD  traMADol (ULTRAM) 50 MG tablet Take 50 mg by mouth 3 (three) times daily as needed (for pain).   Yes Historical Provider, MD  traZODone (DESYREL) 50 MG tablet Take 25 mg by mouth at bedtime as needed for sleep.    Yes Historical Provider, MD  aspirin EC 81 MG tablet Take 81 mg by mouth daily.  Historical Provider, MD  tiotropium (SPIRIVA) 18 MCG inhalation capsule Place 18 mcg into inhaler and inhale daily.    Historical Provider, MD   Physical Exam: Filed Vitals:   12/12/13 0700 12/12/13 0730 12/12/13 0800 12/12/13 0830  BP: 146/66 125/67 135/63 130/68  Pulse: 59 59 59 59  Temp:      TempSrc:      Resp: 20 19 18 18   SpO2: 97% 97% 96% 97%    Wt Readings from Last 3 Encounters:  11/07/13 66.724 kg (147 lb 1.6 oz)  08/29/13 68.947 kg (152 lb)  06/20/13 68.947 kg (152 lb)    General:  Elderly gentleman laying on a gurney sleeping deeply opens eyes to verbal stimuli however falls back asleep. In no acute cardiopulmonary distress.  Eyes: PERRLA, normal lids, irises & conjunctiva ENT: grossly normal hearing, lips & tongue. Extremely dry mucous membranes. Neck: no LAD, masses or thyromegaly Cardiovascular: RRR, no m/r/g. No LE edema. Respiratory: CTA bilaterally in the anterior lung fields, no w/r/r. Normal  respiratory effort. Abdomen: soft, ntnd, positive bowel sounds, no rebound, no guarding Skin: no rash or induration seen on limited exam Musculoskeletal: Unable to assess as patient is asleep. Psychiatric: Unable to assess as patient is asleep. Neurologic: Unable to assess as patient is asleep.           Labs on Admission:  Basic Metabolic Panel:  Recent Labs Lab 12/12/13 0028  NA 139  K 4.1  CL 100  CO2 26  GLUCOSE 88  BUN 17  CREATININE 0.89  CALCIUM 9.6   Liver Function Tests:  Recent Labs Lab 12/12/13 0028  AST 14  ALT 6  ALKPHOS 98  BILITOT 0.5  PROT 6.9  ALBUMIN 3.1*    Recent Labs Lab 12/12/13 0028  LIPASE 23   No results found for this basename: AMMONIA,  in the last 168 hours CBC:  Recent Labs Lab 12/12/13 0028  WBC 8.5  NEUTROABS 5.8  HGB 12.6*  HCT 38.1*  MCV 90.1  PLT 135*   Cardiac Enzymes:  Recent Labs Lab 12/12/13 0028  TROPONINI <0.30    BNP (last 3 results)  Recent Labs  11/07/13 2031  PROBNP 1584.0*   CBG: No results found for this basename: GLUCAP,  in the last 168 hours  Radiological Exams on Admission: Dg Chest 2 View  12/12/2013   CLINICAL DATA:  Altered mental status.  EXAM: CHEST  2 VIEW  COMPARISON:  Chest radiograph performed 11/07/2013  FINDINGS: The lungs are well-aerated and clear. There is no evidence of focal opacification, pleural effusion or pneumothorax. There is mild elevation of the left hemidiaphragm.  The heart is normal in size; a pacemaker is noted at the left chest wall, with leads ending at the right atrium and right ventricle. No acute osseous abnormalities are seen. Cervical spinal fusion hardware is partially imaged.  IMPRESSION: Mild elevation of the left hemidiaphragm. No acute cardiopulmonary process seen.   Electronically Signed   By: Garald Balding M.D.   On: 12/12/2013 04:34   Dg Lumbar Spine Complete  12/12/2013   CLINICAL DATA:  Multiple falls, with lower back pain.  EXAM: LUMBAR SPINE -  COMPLETE 4+ VIEW  COMPARISON:  Lumbar spine radiographs performed 11/11/2013  FINDINGS: There is relatively stable compression deformity of vertebral body L1, and new very mild compression deformity involving the superior endplate of L2. This may be acute in nature. There is mildly worsened narrowing of the intervertebral disc space at L3-L4. Mild grade  1 retrolisthesis of L2 on L3 and of L3 on L4 appears stable from the prior study. Facet disease is noted at the lower lumbar spine.  The visualized bowel gas pattern is unremarkable in appearance; air and stool are noted within the colon. The sacroiliac joints are within normal limits.  IMPRESSION: 1. New very mild compression deformity involving the superior endplate of L2. Given the recent nature of the prior study, this may reflect an acute injury. No evidence of retropulsion. This involves only a portion of the superior endplate. 2. Stable chronic compression deformity at L1. Mild degenerative change noted along the lumbar spine.   Electronically Signed   By: Garald Balding M.D.   On: 12/12/2013 01:26   Ct Head Wo Contrast  12/12/2013   CLINICAL DATA:  Multiple falls. Altered mental status. Concern for cervical spine injury.  EXAM: CT HEAD WITHOUT CONTRAST  CT CERVICAL SPINE WITHOUT CONTRAST  TECHNIQUE: Multidetector CT imaging of the head and cervical spine was performed following the standard protocol without intravenous contrast. Multiplanar CT image reconstructions of the cervical spine were also generated.  COMPARISON:  CT of the head performed 11/07/2013, and CT of the neck performed 11/08/2013  FINDINGS: CT HEAD FINDINGS  There is no evidence of acute infarction, mass lesion, or intra- or extra-axial hemorrhage on CT.  Prominence of the ventricles and sulci reflects mild cortical volume loss. Periventricular white matter change likely reflects small vessel ischemic microangiopathy. Cerebellar atrophy is noted.  The brainstem and fourth ventricle are  within normal limits. The basal ganglia are unremarkable in appearance. The cerebral hemispheres demonstrate grossly normal gray-white differentiation. No mass effect or midline shift is seen.  There is no evidence of fracture; visualized osseous structures are unremarkable in appearance. The orbits are within normal limits. The paranasal sinuses and mastoid air cells are well-aerated. No significant soft tissue abnormalities are seen.  CT CERVICAL SPINE FINDINGS  There is no evidence of fracture or subluxation. Evaluation of the dens is mildly suboptimal due to motion artifact.  The patient is status post anterior cervical spinal fusion at C3-C5, and posterior cervical spinal fusion at C3-C7. There is minimal grade 1 anterolisthesis of C7 on T1, likely reflecting chronic degenerative change. Vertebral bodies demonstrate normal height. Prevertebral soft tissues are within normal limits.  The thyroid gland is unremarkable in appearance. The visualized lung apices are clear. Calcification is noted at the carotid bifurcations bilaterally.  IMPRESSION: 1. No evidence of traumatic intracranial injury or fracture. 2. No evidence of fracture or subluxation along the cervical spine. 3. Mild cortical volume loss and scattered small vessel ischemic microangiopathy. 4. Postoperative change along the cervical spine. Minimal associated degenerative change seen. 5. Calcification at the carotid bifurcations bilaterally. Carotid ultrasound would be helpful for further evaluation, when and as deemed clinically appropriate.   Electronically Signed   By: Garald Balding M.D.   On: 12/12/2013 01:17   Ct Cervical Spine Wo Contrast  12/12/2013   CLINICAL DATA:  Multiple falls. Altered mental status. Concern for cervical spine injury.  EXAM: CT HEAD WITHOUT CONTRAST  CT CERVICAL SPINE WITHOUT CONTRAST  TECHNIQUE: Multidetector CT imaging of the head and cervical spine was performed following the standard protocol without intravenous  contrast. Multiplanar CT image reconstructions of the cervical spine were also generated.  COMPARISON:  CT of the head performed 11/07/2013, and CT of the neck performed 11/08/2013  FINDINGS: CT HEAD FINDINGS  There is no evidence of acute infarction, mass lesion, or intra- or  extra-axial hemorrhage on CT.  Prominence of the ventricles and sulci reflects mild cortical volume loss. Periventricular white matter change likely reflects small vessel ischemic microangiopathy. Cerebellar atrophy is noted.  The brainstem and fourth ventricle are within normal limits. The basal ganglia are unremarkable in appearance. The cerebral hemispheres demonstrate grossly normal gray-white differentiation. No mass effect or midline shift is seen.  There is no evidence of fracture; visualized osseous structures are unremarkable in appearance. The orbits are within normal limits. The paranasal sinuses and mastoid air cells are well-aerated. No significant soft tissue abnormalities are seen.  CT CERVICAL SPINE FINDINGS  There is no evidence of fracture or subluxation. Evaluation of the dens is mildly suboptimal due to motion artifact.  The patient is status post anterior cervical spinal fusion at C3-C5, and posterior cervical spinal fusion at C3-C7. There is minimal grade 1 anterolisthesis of C7 on T1, likely reflecting chronic degenerative change. Vertebral bodies demonstrate normal height. Prevertebral soft tissues are within normal limits.  The thyroid gland is unremarkable in appearance. The visualized lung apices are clear. Calcification is noted at the carotid bifurcations bilaterally.  IMPRESSION: 1. No evidence of traumatic intracranial injury or fracture. 2. No evidence of fracture or subluxation along the cervical spine. 3. Mild cortical volume loss and scattered small vessel ischemic microangiopathy. 4. Postoperative change along the cervical spine. Minimal associated degenerative change seen. 5. Calcification at the carotid  bifurcations bilaterally. Carotid ultrasound would be helpful for further evaluation, when and as deemed clinically appropriate.   Electronically Signed   By: Garald Balding M.D.   On: 12/12/2013 01:17    EKG: Independently reviewed. Atrial paced rhythm  Assessment/Plan Principal Problem:   Acute encephalopathy Active Problems:   Dehydration   Altered mental status   COPD (chronic obstructive pulmonary disease)   Hypertension   Esophageal dysphagia   GERD (gastroesophageal reflux disease)   Dementia   Fall   Dysphagia, unspecified(787.20)   Compression fracture of L2   #1 acute encephalopathy Possible etiology. May have been secondary to excess Depakote as patient's daughter feels patient was taken to much of his Depakote. CVA is also included in the differential as well as infectious etiologies versus dehydration. CBC which was done at a normal white count, chest x-ray was negative for any acute infiltrate, urinalysis was unremarkable. Patient is currently afebrile. Urine cultures are pending. CT of the head and C-spine which were done was negative for any acute abnormalities. Patient does have a pacemaker and a such unable to obtain the MRI. Will repeat head CT in 48 hours. Hydrate with IV fluids as patient looks dehydrated. Check a Depakote level. Check RPR, RBC folate, B12 levels. Follow.  #2 dehydration Gentle hydration with IV fluids. Follow.  #3 falls May be secondary to debility. PT/OT.  #4 history of esophageal dysphasia Patient's daughter states that patient was tolerating a mechanical soft diet at home. WIll place patient on a soft diet. Will have speech therapy reassess patient for swallow evaluation.  #5 L2 compression fracture Secondary to mechanical fall. Pain management. PT/OT.  #6 hypertension Continue home regimen of lisinopril, Lopressor.  #7 dementia Continue Namenda, check a Depakote level and continue home regimen of Depakote sprinkles.  #8  COPD Stable. Continue home regimen of Spiriva. Next is needed.  #9 gastroesophageal reflux disease/history of Barrett's esophagus Continue PPI.  #10 prophylaxis PPI for GI prophylaxis. Lovenox for DVT prophylaxis.   Code Status: DO NOT RESUSCITATE DVT Prophylaxis: Lovenox Family Communication: updated daughter,kitty via phone (  [phone]) Disposition Plan: Admit to telemetry  Time spent: [place] MD [facility] Pager [phone]  **Disclaimer: This note may have been dictated with voice recognition software. Similar sounding words can inadvertently be transcribed and this note may contain transcription errors which may not have been corrected upon publication of note.**

## 2013-12-13 ENCOUNTER — Inpatient Hospital Stay (HOSPITAL_COMMUNITY): Payer: Medicare Other

## 2013-12-13 DIAGNOSIS — IMO0002 Reserved for concepts with insufficient information to code with codable children: Secondary | ICD-10-CM

## 2013-12-13 DIAGNOSIS — R404 Transient alteration of awareness: Secondary | ICD-10-CM

## 2013-12-13 LAB — BASIC METABOLIC PANEL
ANION GAP: 11 (ref 5–15)
BUN: 20 mg/dL (ref 6–23)
CALCIUM: 8.7 mg/dL (ref 8.4–10.5)
CO2: 23 meq/L (ref 19–32)
Chloride: 105 mEq/L (ref 96–112)
Creatinine, Ser: 0.85 mg/dL (ref 0.50–1.35)
GFR calc Af Amer: >90 mL/min (ref >90)
GFR calc non Af Amer: 81 mL/min — ABNORMAL LOW (ref >90)
Glucose, Bld: 88 mg/dL (ref 70–99)
Potassium: 4.1 mEq/L (ref 3.7–5.3)
Sodium: 139 mEq/L (ref 137–147)

## 2013-12-13 LAB — CBC
HEMATOCRIT: 31.8 % — AB (ref 39.0–52.0)
Hemoglobin: 10.8 g/dL — ABNORMAL LOW (ref 13.0–17.0)
MCH: 30.5 pg (ref 26.0–34.0)
MCHC: 34 g/dL (ref 30.0–36.0)
MCV: 89.8 fL (ref 78.0–100.0)
Platelets: 120 10*3/uL — ABNORMAL LOW (ref 150–400)
RBC: 3.54 MIL/uL — ABNORMAL LOW (ref 4.22–5.81)
RDW: 12.9 % (ref 11.5–15.5)
WBC: 5.6 10*3/uL (ref 4.0–10.5)

## 2013-12-13 LAB — FOLATE RBC: RBC Folate: 793 ng/mL — ABNORMAL HIGH (ref >280)

## 2013-12-13 LAB — URINE CULTURE

## 2013-12-13 LAB — AMMONIA: Ammonia: 34 umol/L (ref 11–60)

## 2013-12-13 MED ORDER — IPRATROPIUM-ALBUTEROL 0.5-2.5 (3) MG/3ML IN SOLN: 3.0000 mL | RESPIRATORY_TRACT | Status: DC | PRN

## 2013-12-13 MED ORDER — LIDOCAINE 5 % EX PTCH
1.0000 | MEDICATED_PATCH | CUTANEOUS | Status: DC
  Administered 2013-12-13 – 2013-12-15 (×3): 1 via TRANSDERMAL
  Filled 2013-12-13 (×4): qty 1

## 2013-12-13 MED ORDER — QUETIAPINE FUMARATE 100 MG PO TABS
100.0000 mg | ORAL_TABLET | Freq: Every day | ORAL | Status: DC
  Administered 2013-12-13 – 2013-12-15 (×3): 100 mg via ORAL
  Filled 2013-12-13 (×4): qty 1

## 2013-12-13 NOTE — Progress Notes (Signed)
Clinical Social Work Department BRIEF PSYCHOSOCIAL ASSESSMENT 12/13/2013  Patient:  Jerry Kerr, Jerry Kerr     Account Number:  [id number]     Admit date:  12/11/2013  Clinical Social Worker:  Renold Genta  Date/Time:  12/13/2013 03:24 PM  Referred by:  Physician  Date Referred:  12/13/2013 Referred for  Other - See comment   Other Referral:   Admitted from: [facility] type:  Family Other interview type:   patient's daughter, Jerry Kerr at bedside    PSYCHOSOCIAL DATA Living Status:  FACILITY Admitted from facility:  [facility] Level of care:  Independent Living Primary support name:  Jerry Kerr (dtr) c#: [phone] Primary support relationship to patient:  CHILD, ADULT Degree of support available:   good    CURRENT CONCERNS Current Concerns  Post-Acute Placement   Other Concerns:    SOCIAL WORK ASSESSMENT / PLAN CSW received consult that patient was admitted from [place] [facility] with 24 hour caregivers.   Assessment/plan status:  Information/Referral to [employer] Other assessment/ plan:   Information/referral to community resources:   CSW completed FL2 and faxed information to [facility] - provided list of facilities to daughter.    PATIENT'S/FAMILY'S RESPONSE TO PLAN OF CARE: Patient & daughter are known to this CSW from previous hospitalization 8/10 - 8/12 when patient was admitted from [facility] and moved to [facility] with 24 hour caregivers. Daughter states that [facility] and the caregivers have been working out really well. Daughter to discuss with [facility] whether going to SNF or returning to [place] living with caregivers would be best for the patient due to dx: dementia.       Jerry Kerr, [facility] Clinical Social Worker cell #: [phone]

## 2013-12-13 NOTE — Progress Notes (Signed)
Clinical Social Work Department CLINICAL SOCIAL WORK PLACEMENT NOTE 12/13/2013  Patient:  Jerry Kerr, Jerry Kerr  Account Number:  [id number] Admit date:  12/11/2013  Clinical Social Worker:  Renold Genta  Date/time:  12/13/2013 03:30 PM  Clinical Social Work is seeking post-discharge placement for this patient at the following level of care:   SKILLED NURSING   (*CSW will update this form in Epic as items are completed)   12/13/2013  Patient/family provided with [facility] Department of Clinical Social Work's list of facilities offering this level of care within the geographic area requested by the patient (or if unable, by the patient's family).  12/13/2013  Patient/family informed of their freedom to choose among providers that offer the needed level of care, that participate in Medicare, Medicaid or managed care program needed by the patient, have an available bed and are willing to accept the patient.  12/13/2013  Patient/family informed of [facility]' ownership interest in [facility], as well as of the fact that they are under no obligation to receive care at this facility.  PASARR submitted to EDS on 12/13/2013 PASARR number received on 12/13/2013  FL2 transmitted to all facilities in geographic area requested by pt/family on  12/13/2013 FL2 transmitted to all facilities within larger geographic area on   Patient informed that his/her managed care company has contracts with or will negotiate with  certain facilities, including the following:     Patient/family informed of bed offers received:   Patient chooses bed at  Physician recommends and patient chooses bed at    Patient to be transferred to  on   Patient to be transferred to facility by  Patient and family notified of transfer on  Name of family member notified:    The following physician request were entered in Epic:   Additional Comments:   Raynaldo Opitz, [place] Social Worker cell #: [phone]

## 2013-12-13 NOTE — Evaluation (Signed)
Physical Therapy Evaluation Patient Details Name: Jerry Kerr MRN: [id number] DOB: 04/03/1933 Today's Date: 12/13/2013   History of Present Illness  With history of Alzheimer's dementia, COPD, hypertension, anxiety and depression, prior history of substance abuse, gastroesophageal reflux disease, Barrett's esophagus who lives at [facility] home and has 24-hour care presented to the ED with a two-day history of altered mental status and falls  Clinical Impression  Evaluation limited due to patient's arousal level and pain. Pt will benefit from PT to address problems listed in note. Patient has 24/7 caregivers but was ambulatory and  Functional until a few days ago per daughter.    Follow Up Recommendations SNF (unless improves to return home with 24/7 caregivers)    Equipment Recommendations  Wheelchair (measurements PT)    Recommendations for Other Services       Precautions / Restrictions Precautions Precautions: Fall Precaution Comments: per xray report "very mild compression deformity involving the superior endplate of L2"      Mobility  Bed Mobility Overal bed mobility: +2 for physical assistance;+ 2 for safety/equipment;Needs Assistance Bed Mobility: Sidelying to Sit;Sit to Sidelying   Sidelying to sit: +2 for physical assistance;Total assist;+2 for safety/equipment     Sit to sidelying: +2 for physical assistance;Total assist;+2 for safety/equipment General bed mobility comments: used bed pad to scoot hips to EOB and even with +2 assist, unable to achieve full upright sitting due to pain limiting mobility. Pt initially very lethargic. Sat less then 15 seconds with leaning over on R side and attempting to lie back down. c/o back pain.  Transfers                 General transfer comment: unable  Ambulation/Gait                Stairs            Wheelchair Mobility    Modified Rankin (Stroke Patients Only)       Balance Overall  balance assessment: History of Falls;Needs assistance Sitting-balance support: Bilateral upper extremity supported;Feet supported Sitting balance-Leahy Scale: Zero Sitting balance - Comments: pt really never got to full upright. Postural control: Right lateral lean                                   Pertinent Vitals/Pain Pain Assessment: Faces Faces Pain Scale: Hurts whole lot Pain Location: back Pain Intervention(s): Repositioned    Home Living Family/patient expects to be discharged to:: Unsure                      Prior Function Level of Independence: Needs assistance   Gait / Transfers Assistance Needed: doesn't use assistive device however recommended  ADL's / Homemaking Assistance Needed: pt's daughter states he has 24/7 supervision but pt can do own bath, dressing, toileting, and recently started using walker.   Comments: has 24/7 caregivers     Hand Dominance        Extremity/Trunk Assessment   Upper Extremity Assessment: Difficult to assess due to impaired cognition (also decreased arousal.)           Lower Extremity Assessment: Difficult to assess due to impaired cognition      Cervical / Trunk Assessment: Kyphotic  Communication   Communication: No difficulties  Cognition Arousal/Alertness: Lethargic Behavior During Therapy:  (pt so lethargic) Overall Cognitive Status: Difficult to assess  General Comments      Exercises        Assessment/Plan    PT Assessment Patient needs continued PT services  PT Diagnosis Difficulty walking;Acute pain;Generalized weakness;Altered mental status   PT Problem List Decreased strength;Decreased range of motion;Decreased activity tolerance;Decreased balance;Decreased knowledge of precautions;Decreased safety awareness;Decreased knowledge of use of DME;Decreased cognition;Pain  PT Treatment Interventions DME instruction;Gait training;Functional mobility  training;Therapeutic activities;Therapeutic exercise;Patient/family education   PT Goals (Current goals can be found in the Care Plan section) Acute Rehab PT Goals Patient Stated Goal: per daughter- to improve to go back to his apt. PT Goal Formulation: With family Time For Goal Achievement: 12/27/13 Potential to Achieve Goals: Fair    Frequency Min 3X/week   Barriers to discharge        Co-evaluation PT/OT/SLP Co-Evaluation/Treatment: Yes Reason for Co-Treatment: For patient/therapist safety PT goals addressed during session: Mobility/safety with mobility OT goals addressed during session: ADL's and self-care;Proper use of Adaptive equipment and DME       End of Session   Activity Tolerance: Patient limited by lethargy;Patient limited by pain Patient left: in bed;with call bell/phone within reach;with nursing/sitter in room;with family/visitor present Nurse Communication: Mobility status         Time: 8115-7262 PT Time Calculation (min): 20 min   Charges:     PT Treatments $Therapeutic Activity: 8-22 mins   PT G Codes:          [name] 12/13/2013, 1:07 PM Tresa Endo PT [phone]

## 2013-12-13 NOTE — Progress Notes (Signed)
SLP Cancellation Note  Patient Details Name: Jerry Kerr MRN: [id number] DOB: 10-20-1933   Cancelled treatment:       Reason Eval/Treat Not Completed:  (pt currently working with hospital staff, ? being cleaned, will continue efforts)  Luanna Salk, [place] [facility] SLP [phone]

## 2013-12-13 NOTE — Evaluation (Signed)
Occupational Therapy Evaluation Patient Details Name: Jerry Kerr MRN: [id number] DOB: 22-Apr-1933 Today's Date: 12/13/2013    History of Present Illness With history of Alzheimer's dementia, COPD, hypertension, anxiety and depression, prior history of substance abuse, gastroesophageal reflux disease, Barrett's esophagus who lives at [facility] and has 24-hour care presented to the ED with a two-day history of altered mental status and falls   Clinical Impression   Pt very lethargic upon arrival, sleeping soundly. Attempted to sit pt on side of bed but pt limited by back pain and unable to achieve full upright sitting. Will see pt to work toward increased independence with self care tasks and functional transfers. Daughter present for session. She states pt was able to mobilize with supervision only up until the weekend.     Follow Up Recommendations  SNF;Other (comment);Supervision/Assistance - 24 hour (unsure of family's plan for d/c but feel he will need SNF.)    Equipment Recommendations  3 in 1 bedside comode    Recommendations for Other Services       Precautions / Restrictions Precautions Precautions: Fall Precaution Comments: per xray report "very mild compression deformity involving the superior endplate of L2"      Mobility Bed Mobility Overal bed mobility: +2 for physical assistance;+ 2 for safety/equipment;Needs Assistance Bed Mobility: Sidelying to Sit;Sit to Sidelying   Sidelying to sit: +2 for physical assistance;Total assist;+2 for safety/equipment     Sit to sidelying: +2 for physical assistance;Total assist;+2 for safety/equipment General bed mobility comments: used bed pad to scoot hips to EOB and even with +2 assist, unable to achieve full upright sitting due to pain limiting mobility. Pt initially very lethargic. Sat less then 15 seconds with leaning over on R side.  Transfers                 General transfer comment: unable     Balance                                            ADL Overall ADL's : Needs assistance/impaired                                       General ADL Comments: attempted to assist pt from sidelying to sit but pt initially so lethargic and when therapists tried to help sit up, pt groaning in pain and pulling himself to lay back down. Unable to achieve full sitting position. Nursing in room and aware of pain limiting session. Daughter present for session and gave the PLOF/history. Unsure of family plans for d/c however feel he will likley need SNF.     Vision                     Perception     Praxis      Pertinent Vitals/Pain Pain Assessment: Faces Faces Pain Scale: Hurts whole lot Pain Location: back Pain Intervention(s): Other (comment) (nursing present in room)     Hand Dominance     Extremity/Trunk Assessment Upper Extremity Assessment Upper Extremity Assessment: Difficult to assess due to impaired cognition (also decreased arousal.)           Communication     Cognition Arousal/Alertness: Lethargic Behavior During Therapy:  (pt so lethargic) Overall Cognitive Status: Difficult to assess  General Comments       Exercises       Shoulder Instructions      Home Living Family/patient expects to be discharged to:: Unsure (recommending SNF. Unsure of family plans.)                                        Prior Functioning/Environment Level of Independence: Needs assistance    ADL's / Homemaking Assistance Needed: pt's daughter states he has 24/7 supervision but pt can do own bath, dressing, toileting, and recently started using walker.    Comments: has 24/7 caregivers    OT Diagnosis: Generalized weakness;Acute pain   OT Problem List: Decreased strength;Decreased knowledge of use of DME or AE;Decreased cognition;Pain   OT Treatment/Interventions: Self-care/ADL  training;Patient/family education;Therapeutic activities;DME and/or AE instruction    OT Goals(Current goals can be found in the care plan section) Acute Rehab OT Goals OT Goal Formulation: Patient unable to participate in goal setting Time For Goal Achievement: 12/27/13 Potential to Achieve Goals: Good  OT Frequency: Min 2X/week   Barriers to D/C:            Co-evaluation PT/OT/SLP Co-Evaluation/Treatment: Yes Reason for Co-Treatment: For patient/therapist safety   OT goals addressed during session: ADL's and self-care;Proper use of Adaptive equipment and DME      End of Session    Activity Tolerance: Patient limited by pain;Patient limited by lethargy Patient left: in bed;with call bell/phone within reach;with family/visitor present   Time: 1443-1540 OT Time Calculation (min): 20 min Charges:  OT General Charges $OT Visit: 1 Procedure OT Evaluation $Initial OT Evaluation Tier I: 1 Procedure OT Treatments $Therapeutic Activity: 8-22 mins G-Codes:    Jules Schick [phone] 12/13/2013, 12:23 PM

## 2013-12-13 NOTE — Progress Notes (Addendum)
[facility] PROGRESS NOTE  Jerry Kerr XLK:[id number] DOB: Apr 19, 1933 DOA: 12/11/2013 PCP: Jerene Bears, MD   Brief narrative 79 year old male with Alzheimer's dementia, history of alcohol abuse, COPD, hypertension, anxiety and depression, Barrett's esophagus who is currently a resident of [facility] with 24-hour care was brought to the ED by his daughter for 2 day history of altered mental status and frequent falls. As per his daughter, patient had a for 3 days prior to admission following which he was combative and incontinent. He again fell in the bath and the next day. On the day of admission he had poor oral intake and was complaining of back pain, hallucinating and unable to identify his daughter. He also reportedly had some slurred speech. Daughter is not sure if he took extra doses of Depakote. Patient is on tramadol for pain but daughter does not think he may have taken extra pain medications. Head CT, chest x-ray and UA on admission unremarkable. Patient admitted to medical floor for further workup of his acute encephalopathy.  Assessment/Plan: Acute encephalopathy Possibly metabolic with worsening of his underlying dementia. Also on multiple medications for his anxiety and depression that could contribute. Patient extremely delirious since admission.  No clinical signs of infection. Seems to be mildly dehydrated. It is possible that he may have taken extra Depakote or tramadol. Head CT and CT of the cervical spine negative on admission. Cannot obtain MRI to rule out stroke as patient has a pacemaker. Plan to obtain repeat head CT in a.m. -Depakote level therapeutic. RPR, B12 and TSH normal. Ammonia level normal. Blood culture ordered. -Continue neuro checks. When necessary IV Haldol for agitation. -Seen by swallow eval on admission and recommended dysphagia level III diet. -I will discontinue his home dose of trazodone, Zoloft and tramadol. I would also hold  his  Depakote. -I will increase his Seroquel 200 mg daily and was low-dose when necessary Ativan (patient is scheduled Ativan 0.5 mg twice daily at home). -If symptoms unimproved over the next 24-48 hours, he will need a neurology consult  Hypertension Resume home blood pressure medications. I would place him on when necessary hydralazine if he's unable to take by mouth medications.  Compression fracture of L2 vertebrae Tylenol and when necessary Toradol for back pain. Physical therapy once more oriented  COPD Continue home inhalers  Alzheimer's dementia Patient needs assistance with some of his ADLs but is able to  participate in conversation as per daughter  History of alcohol abuse Daughter reports that patient has quit alcohol for several months. Will Monitor  Code Status: DO NOT RESUSCITATE Family Communication: daughter [name] at bedside Disposition Plan: Pending clinical improvement. May return back to assist living versus skilled nursing facility   Consultants:  None  Procedures:  None  Antibiotics:  None  HPI/Subjective: Patient seen and examined this morning. Arousable to command and not agitated but confused. He is able to recognize his daughter on repeat questioning  Objective: Filed Vitals:   12/13/13 1254  BP: 142/73  Pulse: 60  Temp: 98.6 F (37 C)  Resp: 20    Intake/Output Summary (Last 24 hours) at 12/13/13 1615 Last data filed at 12/13/13 1500  Gross per 24 hour  Intake 1982.5 ml  Output    700 ml  Net 1282.5 ml   Filed Weights   12/12/13 0925 12/13/13 0628  Weight: 66.7 kg (147 lb 0.8 oz) 67.631 kg (149 lb 1.6 oz)    Exam:   General:  Elderly male lying  in bed sleepy but arousable  HEENT: Moist oral mucosa  Cardiovascular: Normal S1 and S2, no murmurs  Respiratory: Her to auscultation bilaterally  Abdomen: Soft, nontender, nondistended, bowel sounds present  Musculoskeletal: Warm, no edema  CNS: Awake to commands and follows  simple commands , not oriented  Data Reviewed: Basic Metabolic Panel:  Recent Labs Lab 12/12/13 0028 12/12/13 1120 12/13/13 0435  NA 139  --  139  K 4.1  --  4.1  CL 100  --  105  CO2 26  --  23  GLUCOSE 88  --  88  BUN 17  --  20  CREATININE 0.89 0.86 0.85  CALCIUM 9.6  --  8.7  MG  --  1.9  --    Liver Function Tests:  Recent Labs Lab 12/12/13 0028  AST 14  ALT 6  ALKPHOS 98  BILITOT 0.5  PROT 6.9  ALBUMIN 3.1*    Recent Labs Lab 12/12/13 0028  LIPASE 23    Recent Labs Lab 12/13/13 0955  AMMONIA 34   CBC:  Recent Labs Lab 12/12/13 0028 12/12/13 1120 12/13/13 0435  WBC 8.5 6.4 5.6  NEUTROABS 5.8  --   --   HGB 12.6* 12.0* 10.8*  HCT 38.1* 36.0* 31.8*  MCV 90.1 90.7 89.8  PLT 135* 111* 120*   Cardiac Enzymes:  Recent Labs Lab 12/12/13 0028  TROPONINI <0.30   BNP (last 3 results)  Recent Labs  11/07/13 2031  PROBNP 1584.0*   CBG: No results found for this basename: GLUCAP,  in the last 168 hours  Recent Results (from the past 240 hour(s))  URINE CULTURE     Status: None   Collection Time    12/12/13 12:27 AM      Result Value Ref Range Status   Specimen Description URINE, CLEAN CATCH   Final   Special Requests NONE   Final   Culture  Setup Time     Final   Value: 12/12/2013 10:22     Performed at [facility] Count     Final   Value: 55,000 COLONIES/ML     Performed at [facility]   Culture     Final   Value: Multiple bacterial morphotypes present, none predominant. Suggest appropriate recollection if clinically indicated.     Performed at [facility]   Report Status 12/13/2013 FINAL   Final  MRSA PCR SCREENING     Status: Abnormal   Collection Time    12/12/13 11:15 AM      Result Value Ref Range Status   MRSA by PCR POSITIVE (*) NEGATIVE Final   Comment:            The GeneXpert MRSA Assay (FDA     approved for NASAL specimens     only), is one component of a     comprehensive MRSA  colonization     surveillance program. It is not     intended to diagnose MRSA     infection nor to guide or     monitor treatment for     MRSA infections.     RESULT CALLED TO, READ BACK BY AND VERIFIED WITH:     L. Ouida Sills RN AT 1829 ON 09.14.15 BY SHUEA     Studies: Dg Chest 2 View  12/12/2013   CLINICAL DATA:  Altered mental status.  EXAM: CHEST  2 VIEW  COMPARISON:  Chest radiograph performed 11/07/2013  FINDINGS: The lungs  are well-aerated and clear. There is no evidence of focal opacification, pleural effusion or pneumothorax. There is mild elevation of the left hemidiaphragm.  The heart is normal in size; a pacemaker is noted at the left chest wall, with leads ending at the right atrium and right ventricle. No acute osseous abnormalities are seen. Cervical spinal fusion hardware is partially imaged.  IMPRESSION: Mild elevation of the left hemidiaphragm. No acute cardiopulmonary process seen.   Electronically Signed   By: Garald Balding M.D.   On: 12/12/2013 04:34   Dg Lumbar Spine Complete  12/12/2013   CLINICAL DATA:  Multiple falls, with lower back pain.  EXAM: LUMBAR SPINE - COMPLETE 4+ VIEW  COMPARISON:  Lumbar spine radiographs performed 11/11/2013  FINDINGS: There is relatively stable compression deformity of vertebral body L1, and new very mild compression deformity involving the superior endplate of L2. This may be acute in nature. There is mildly worsened narrowing of the intervertebral disc space at L3-L4. Mild grade 1 retrolisthesis of L2 on L3 and of L3 on L4 appears stable from the prior study. Facet disease is noted at the lower lumbar spine.  The visualized bowel gas pattern is unremarkable in appearance; air and stool are noted within the colon. The sacroiliac joints are within normal limits.  IMPRESSION: 1. New very mild compression deformity involving the superior endplate of L2. Given the recent nature of the prior study, this may reflect an acute injury. No evidence of  retropulsion. This involves only a portion of the superior endplate. 2. Stable chronic compression deformity at L1. Mild degenerative change noted along the lumbar spine.   Electronically Signed   By: Garald Balding M.D.   On: 12/12/2013 01:26   Ct Head Wo Contrast  12/12/2013   CLINICAL DATA:  Multiple falls. Altered mental status. Concern for cervical spine injury.  EXAM: CT HEAD WITHOUT CONTRAST  CT CERVICAL SPINE WITHOUT CONTRAST  TECHNIQUE: Multidetector CT imaging of the head and cervical spine was performed following the standard protocol without intravenous contrast. Multiplanar CT image reconstructions of the cervical spine were also generated.  COMPARISON:  CT of the head performed 11/07/2013, and CT of the neck performed 11/08/2013  FINDINGS: CT HEAD FINDINGS  There is no evidence of acute infarction, mass lesion, or intra- or extra-axial hemorrhage on CT.  Prominence of the ventricles and sulci reflects mild cortical volume loss. Periventricular white matter change likely reflects small vessel ischemic microangiopathy. Cerebellar atrophy is noted.  The brainstem and fourth ventricle are within normal limits. The basal ganglia are unremarkable in appearance. The cerebral hemispheres demonstrate grossly normal gray-white differentiation. No mass effect or midline shift is seen.  There is no evidence of fracture; visualized osseous structures are unremarkable in appearance. The orbits are within normal limits. The paranasal sinuses and mastoid air cells are well-aerated. No significant soft tissue abnormalities are seen.  CT CERVICAL SPINE FINDINGS  There is no evidence of fracture or subluxation. Evaluation of the dens is mildly suboptimal due to motion artifact.  The patient is status post anterior cervical spinal fusion at C3-C5, and posterior cervical spinal fusion at C3-C7. There is minimal grade 1 anterolisthesis of C7 on T1, likely reflecting chronic degenerative change. Vertebral bodies  demonstrate normal height. Prevertebral soft tissues are within normal limits.  The thyroid gland is unremarkable in appearance. The visualized lung apices are clear. Calcification is noted at the carotid bifurcations bilaterally.  IMPRESSION: 1. No evidence of traumatic intracranial injury or fracture. 2. No  evidence of fracture or subluxation along the cervical spine. 3. Mild cortical volume loss and scattered small vessel ischemic microangiopathy. 4. Postoperative change along the cervical spine. Minimal associated degenerative change seen. 5. Calcification at the carotid bifurcations bilaterally. Carotid ultrasound would be helpful for further evaluation, when and as deemed clinically appropriate.   Electronically Signed   By: Garald Balding M.D.   On: 12/12/2013 01:17   Ct Cervical Spine Wo Contrast  12/12/2013   CLINICAL DATA:  Multiple falls. Altered mental status. Concern for cervical spine injury.  EXAM: CT HEAD WITHOUT CONTRAST  CT CERVICAL SPINE WITHOUT CONTRAST  TECHNIQUE: Multidetector CT imaging of the head and cervical spine was performed following the standard protocol without intravenous contrast. Multiplanar CT image reconstructions of the cervical spine were also generated.  COMPARISON:  CT of the head performed 11/07/2013, and CT of the neck performed 11/08/2013  FINDINGS: CT HEAD FINDINGS  There is no evidence of acute infarction, mass lesion, or intra- or extra-axial hemorrhage on CT.  Prominence of the ventricles and sulci reflects mild cortical volume loss. Periventricular white matter change likely reflects small vessel ischemic microangiopathy. Cerebellar atrophy is noted.  The brainstem and fourth ventricle are within normal limits. The basal ganglia are unremarkable in appearance. The cerebral hemispheres demonstrate grossly normal gray-white differentiation. No mass effect or midline shift is seen.  There is no evidence of fracture; visualized osseous structures are unremarkable in  appearance. The orbits are within normal limits. The paranasal sinuses and mastoid air cells are well-aerated. No significant soft tissue abnormalities are seen.  CT CERVICAL SPINE FINDINGS  There is no evidence of fracture or subluxation. Evaluation of the dens is mildly suboptimal due to motion artifact.  The patient is status post anterior cervical spinal fusion at C3-C5, and posterior cervical spinal fusion at C3-C7. There is minimal grade 1 anterolisthesis of C7 on T1, likely reflecting chronic degenerative change. Vertebral bodies demonstrate normal height. Prevertebral soft tissues are within normal limits.  The thyroid gland is unremarkable in appearance. The visualized lung apices are clear. Calcification is noted at the carotid bifurcations bilaterally.  IMPRESSION: 1. No evidence of traumatic intracranial injury or fracture. 2. No evidence of fracture or subluxation along the cervical spine. 3. Mild cortical volume loss and scattered small vessel ischemic microangiopathy. 4. Postoperative change along the cervical spine. Minimal associated degenerative change seen. 5. Calcification at the carotid bifurcations bilaterally. Carotid ultrasound would be helpful for further evaluation, when and as deemed clinically appropriate.   Electronically Signed   By: Garald Balding M.D.   On: 12/12/2013 01:17   Portable Chest 1 View  12/13/2013   CLINICAL DATA:  Altered mental status, pneumonia  EXAM: PORTABLE CHEST - 1 VIEW  COMPARISON:  Radiograph 12/12/2013  FINDINGS: Left-sided pacemaker overlies normal cardiac silhouette. There is mild interstitial edema pattern increased compared to prior. Lungs are hyperinflated. No pneumothorax.  IMPRESSION: Mild interstitial edema.   Electronically Signed   By: Suzy Bouchard M.D.   On: 12/13/2013 07:44    Scheduled Meds: . aspirin EC  81 mg Oral Daily  . Chlorhexidine Gluconate Cloth  6 each Topical Q0600  . divalproex  500 mg Oral BID  . docusate sodium  100 mg  Oral BID  . enoxaparin (LOVENOX) injection  40 mg Subcutaneous Q24H  . lactobacillus acidophilus & bulgar  1 tablet Oral Daily  . lidocaine  1 patch Transdermal Q24H  . lisinopril  5 mg Oral Daily  . memantine  10 mg Oral BID  . metoprolol  50 mg Oral BID  . mupirocin ointment  1 application Nasal BID  . pantoprazole  80 mg Oral BID  . potassium chloride  20 mEq Oral Daily  . QUEtiapine  100 mg Oral QHS  . sodium chloride  3 mL Intravenous Q12H  . tiotropium  18 mcg Inhalation Daily   Continuous Infusions: . sodium chloride 75 mL/hr (12/13/13 1244)      Time spent: 25 minutes    Stirling Orton, [facility] Pager [phone] If 7PM-7AM, please contact night-coverage at [email or web address], password [facility] 12/13/2013, 4:15 PM  LOS: 2 days

## 2013-12-14 ENCOUNTER — Inpatient Hospital Stay (HOSPITAL_COMMUNITY): Payer: Medicare Other

## 2013-12-14 DIAGNOSIS — J438 Other emphysema: Secondary | ICD-10-CM

## 2013-12-14 DIAGNOSIS — I10 Essential (primary) hypertension: Secondary | ICD-10-CM

## 2013-12-14 MED ORDER — POLYETHYLENE GLYCOL 3350 17 G PO PACK
17.0000 g | PACK | Freq: Every day | ORAL | Status: DC | PRN
  Filled 2013-12-14: qty 1

## 2013-12-14 MED ORDER — HYDRALAZINE HCL 20 MG/ML IJ SOLN
5.0000 mg | Freq: Four times a day (QID) | INTRAMUSCULAR | Status: DC | PRN
  Administered 2013-12-14 – 2013-12-15 (×2): 5 mg via INTRAVENOUS
  Filled 2013-12-14 (×2): qty 1

## 2013-12-14 MED ORDER — LISINOPRIL 10 MG PO TABS
10.0000 mg | ORAL_TABLET | Freq: Every day | ORAL | Status: DC
  Administered 2013-12-15 – 2013-12-16 (×2): 10 mg via ORAL
  Filled 2013-12-14 (×2): qty 1

## 2013-12-14 MED ORDER — LISINOPRIL 5 MG PO TABS
5.0000 mg | ORAL_TABLET | Freq: Once | ORAL | Status: AC
  Administered 2013-12-14: 5 mg via ORAL
  Filled 2013-12-14: qty 1

## 2013-12-14 NOTE — Progress Notes (Signed)
Speech Language Pathology Treatment: Dysphagia  Patient Details Name: Jerry Kerr MRN: [id number] DOB: May 20, 1933 Today's Date: 12/14/2013 Time: 1660-6301 SLP Time Calculation (min): 15 min  Assessment / Plan / Recommendation Clinical Impression  SLP met with daughter [name] and pt regarding education to precautions/compensation strategies to mitigate aspiration.  Daughter reports pt coughed excessively with medicine administration today (given with water per [facility]).  Advised pt to take medicine with icecream or applesauce to decrease cough and subsequently increase comfort with intake.  Pt repeatedly stated "water" several times declining to consider alternative ways for medications- even to his daughter.  Do not anticipate pt will comply with diet modifications or compensation strategies.    Kitty reports pt with better tolerance of intake today and less coughing.  Advised pt/daughter to use thickener or milkshakes if increases comfort and decreases cough with intake.  Regardless pt will likely to continue to have episodic aspiration due to his mechanical dysphagia, COPD, and cognitive deficits.     SLP to sign off as all education completed using teach back with daughter.     HPI HPI: 80 yo male adm to [facility] with AMS, recent falls, hallucinations.  Pt with PMH of ETOH use, COPD, Barrett's esophagus, reflux, hiatal hernia s/p esophageal diliation. Pt also with h/o ACDF with hardware from C3-C7 and concern was present for injury with this admit.  2 MBS studies completed within the last month with dys3/nectar diet being recommended with consideration for the frazier water protocol during most recent testing.  Chin tuck posture was also recommended.  Pt has not been consuming thickened liquids consistently and has not been conducting chin tuck. He mostly consumes milkshakes and milk per daughter/pt.  CXR currently is negative.  Swallow evaluation ordered.    Pertinent Vitals Pain Assessment:  No/denies pain Pain Score: 0-No pain  SLP Plan  All goals met    Recommendations Diet recommendations: Dysphagia 3 (mechanical soft);Thin liquid Medication Administration: Whole meds with puree (pt states he only will take medicine with water, repeatedly stated said information) Supervision: Patient able to self feed Compensations: Slow rate;Small sips/bites (clear throat/cough as needed) Postural Changes and/or Swallow Maneuvers: Seated upright 90 degrees;Upright 30-60 min after meal              Oral Care Recommendations: Oral care BID Follow up Recommendations: Home health SLP (briefly for education/diet modification as needed) Plan: All goals met    [place], [place], [place] [facility] [phone]

## 2013-12-14 NOTE — Progress Notes (Signed)
PROGRESS NOTE  Jerry Kerr YCX:[id number] DOB: 1933/11/19 DOA: 12/11/2013 PCP: Jerry Bears, MD  Assessment/Plan: Acute encephalopathy  -metabolic with worsening of his underlying dementia.  -Also on multiple medications for his anxiety and depression that could contribute due to anticholinergic side effects.  -No clinical signs of infection.  -Seems to be mildly dehydrated.  -possible that he may have taken extra Depakote or tramadol. Head CT and -CT of the cervical spine negative on admission. Cannot obtain MRI to rule out stroke as patient has a pacemaker. Plan to obtain repeat head CT in a.m.  -12/14/2013 repeat CT brain negative for acute findings -Depakote level therapeutic. RPR, B12 and TSH normal. Ammonia level normal.  -Blood culture ordered.  -Continue neuro checks. When necessary IV Haldol for agitation.  -Seen by swallow eval on admission and recommended dysphagia level III diet.  -discontinue his home dose of trazodone, Zoloft and tramadol.  -continue seroquel 100mg  qhs mg daily and was low-dose when necessary Ativan (patient is scheduled Ativan 0.5 mg twice daily at home).  -12/14/2013--more alert, less agitated Hypertension  Resume home blood pressure medications. Compression fracture of L2 vertebrae  Tylenol and when necessary Toradol for back pain. Physical therapy once more oriented  COPD  Continue home inhalers  Alzheimer's dementia  Patient needs assistance with some of his ADLs but is able to participate in conversation as per daughter  History of alcohol abuse  Daughter reports that patient has quit alcohol for several months. Will Monitor  Code Status: DO NOT RESUSCITATE  Family Communication: daughter Jerry Kerr at bedside  Disposition Plan: [place] with 24hr supervision--anticipate 9/17          Procedures/Studies: Dg Chest 2 View  12/12/2013   CLINICAL DATA:  Altered mental status.  EXAM: CHEST  2 VIEW  COMPARISON:  Chest radiograph  performed 11/07/2013  FINDINGS: The lungs are well-aerated and clear. There is no evidence of focal opacification, pleural effusion or pneumothorax. There is mild elevation of the left hemidiaphragm.  The heart is normal in size; a pacemaker is noted at the left chest wall, with leads ending at the right atrium and right ventricle. No acute osseous abnormalities are seen. Cervical spinal fusion hardware is partially imaged.  IMPRESSION: Mild elevation of the left hemidiaphragm. No acute cardiopulmonary process seen.   Electronically Signed   By: Garald Balding M.D.   On: 12/12/2013 04:34   Dg Lumbar Spine Complete  12/12/2013   CLINICAL DATA:  Multiple falls, with lower back pain.  EXAM: LUMBAR SPINE - COMPLETE 4+ VIEW  COMPARISON:  Lumbar spine radiographs performed 11/11/2013  FINDINGS: There is relatively stable compression deformity of vertebral body L1, and new very mild compression deformity involving the superior endplate of L2. This may be acute in nature. There is mildly worsened narrowing of the intervertebral disc space at L3-L4. Mild grade 1 retrolisthesis of L2 on L3 and of L3 on L4 appears stable from the prior study. Facet disease is noted at the lower lumbar spine.  The visualized bowel gas pattern is unremarkable in appearance; air and stool are noted within the colon. The sacroiliac joints are within normal limits.  IMPRESSION: 1. New very mild compression deformity involving the superior endplate of L2. Given the recent nature of the prior study, this may reflect an acute injury. No evidence of retropulsion. This involves only a portion of the superior endplate. 2. Stable chronic compression deformity at L1. Mild degenerative change  noted along the lumbar spine.   Electronically Signed   By: Garald Balding M.D.   On: 12/12/2013 01:26   Ct Head Wo Contrast  12/14/2013   CLINICAL DATA:  Altered mental status. Recent fall with confusion and slurred speech.  EXAM: CT HEAD WITHOUT CONTRAST   TECHNIQUE: Contiguous axial images were obtained from the base of the skull through the vertex without intravenous contrast.  COMPARISON:  12/12/2013 and 03/23/2012  FINDINGS: Ventricles, cisterns and other CSF spaces are within normal. There is mild chronic ischemic microvascular disease present. There is no mass, mass effect, shift of midline structures or acute hemorrhage. There is no evidence to suggest acute infarction. Minimal calcification along the left tentorium. Remaining bones and soft tissues are unchanged.  IMPRESSION: No acute intracranial findings.  Mild chronic ischemic microvascular disease.   Electronically Signed   By: Marin Olp M.D.   On: 12/14/2013 09:55   Ct Head Wo Contrast  12/12/2013   CLINICAL DATA:  Multiple falls. Altered mental status. Concern for cervical spine injury.  EXAM: CT HEAD WITHOUT CONTRAST  CT CERVICAL SPINE WITHOUT CONTRAST  TECHNIQUE: Multidetector CT imaging of the head and cervical spine was performed following the standard protocol without intravenous contrast. Multiplanar CT image reconstructions of the cervical spine were also generated.  COMPARISON:  CT of the head performed 11/07/2013, and CT of the neck performed 11/08/2013  FINDINGS: CT HEAD FINDINGS  There is no evidence of acute infarction, mass lesion, or intra- or extra-axial hemorrhage on CT.  Prominence of the ventricles and sulci reflects mild cortical volume loss. Periventricular white matter change likely reflects small vessel ischemic microangiopathy. Cerebellar atrophy is noted.  The brainstem and fourth ventricle are within normal limits. The basal ganglia are unremarkable in appearance. The cerebral hemispheres demonstrate grossly normal gray-white differentiation. No mass effect or midline shift is seen.  There is no evidence of fracture; visualized osseous structures are unremarkable in appearance. The orbits are within normal limits. The paranasal sinuses and mastoid air cells are  well-aerated. No significant soft tissue abnormalities are seen.  CT CERVICAL SPINE FINDINGS  There is no evidence of fracture or subluxation. Evaluation of the dens is mildly suboptimal due to motion artifact.  The patient is status post anterior cervical spinal fusion at C3-C5, and posterior cervical spinal fusion at C3-C7. There is minimal grade 1 anterolisthesis of C7 on T1, likely reflecting chronic degenerative change. Vertebral bodies demonstrate normal height. Prevertebral soft tissues are within normal limits.  The thyroid gland is unremarkable in appearance. The visualized lung apices are clear. Calcification is noted at the carotid bifurcations bilaterally.  IMPRESSION: 1. No evidence of traumatic intracranial injury or fracture. 2. No evidence of fracture or subluxation along the cervical spine. 3. Mild cortical volume loss and scattered small vessel ischemic microangiopathy. 4. Postoperative change along the cervical spine. Minimal associated degenerative change seen. 5. Calcification at the carotid bifurcations bilaterally. Carotid ultrasound would be helpful for further evaluation, when and as deemed clinically appropriate.   Electronically Signed   By: Garald Balding M.D.   On: 12/12/2013 01:17   Ct Cervical Spine Wo Contrast  12/12/2013   CLINICAL DATA:  Multiple falls. Altered mental status. Concern for cervical spine injury.  EXAM: CT HEAD WITHOUT CONTRAST  CT CERVICAL SPINE WITHOUT CONTRAST  TECHNIQUE: Multidetector CT imaging of the head and cervical spine was performed following the standard protocol without intravenous contrast. Multiplanar CT image reconstructions of the cervical spine were also generated.  COMPARISON:  CT of the head performed 11/07/2013, and CT of the neck performed 11/08/2013  FINDINGS: CT HEAD FINDINGS  There is no evidence of acute infarction, mass lesion, or intra- or extra-axial hemorrhage on CT.  Prominence of the ventricles and sulci reflects mild cortical volume  loss. Periventricular white matter change likely reflects small vessel ischemic microangiopathy. Cerebellar atrophy is noted.  The brainstem and fourth ventricle are within normal limits. The basal ganglia are unremarkable in appearance. The cerebral hemispheres demonstrate grossly normal gray-white differentiation. No mass effect or midline shift is seen.  There is no evidence of fracture; visualized osseous structures are unremarkable in appearance. The orbits are within normal limits. The paranasal sinuses and mastoid air cells are well-aerated. No significant soft tissue abnormalities are seen.  CT CERVICAL SPINE FINDINGS  There is no evidence of fracture or subluxation. Evaluation of the dens is mildly suboptimal due to motion artifact.  The patient is status post anterior cervical spinal fusion at C3-C5, and posterior cervical spinal fusion at C3-C7. There is minimal grade 1 anterolisthesis of C7 on T1, likely reflecting chronic degenerative change. Vertebral bodies demonstrate normal height. Prevertebral soft tissues are within normal limits.  The thyroid gland is unremarkable in appearance. The visualized lung apices are clear. Calcification is noted at the carotid bifurcations bilaterally.  IMPRESSION: 1. No evidence of traumatic intracranial injury or fracture. 2. No evidence of fracture or subluxation along the cervical spine. 3. Mild cortical volume loss and scattered small vessel ischemic microangiopathy. 4. Postoperative change along the cervical spine. Minimal associated degenerative change seen. 5. Calcification at the carotid bifurcations bilaterally. Carotid ultrasound would be helpful for further evaluation, when and as deemed clinically appropriate.   Electronically Signed   By: Garald Balding M.D.   On: 12/12/2013 01:17   Portable Chest 1 View  12/13/2013   CLINICAL DATA:  Altered mental status, pneumonia  EXAM: PORTABLE CHEST - 1 VIEW  COMPARISON:  Radiograph 12/12/2013  FINDINGS: Left-sided  pacemaker overlies normal cardiac silhouette. There is mild interstitial edema pattern increased compared to prior. Lungs are hyperinflated. No pneumothorax.  IMPRESSION: Mild interstitial edema.   Electronically Signed   By: Suzy Bouchard M.D.   On: 12/13/2013 07:44   Dg Swallowing Func-speech Pathology  11/17/2013   Loletha Grayer, [place]     11/17/2013 12:14 PM Objective Swallowing Evaluation: Modified Barium Swallowing Study   Patient Details  Name: Severiano Utsey MRN: [id number] Date of Birth: [birth year]  Today's Date: 11/17/2013 Time: 1194-1740 SLP Time Calculation (min): 27 min  Past Medical History:  Past Medical History  Diagnosis Date  . Asthma   . Dementia   . Back pain   . COPD (chronic obstructive pulmonary disease)   . Hypertension   . Pacemaker   . Anxiety   . Depression   . Cancer     SKIN, PROSTATE  . GERD (gastroesophageal reflux disease)   . Substance abuse     ETOH   Past Surgical History:  Past Surgical History  Procedure Laterality Date  . Neck surgery    . Upper gastrointestinal endoscopy    . Colonoscopy    . Ankle surgery    . Skin graft    . Pacemaker insertion    . Tonsillectomy     HPI:  80 yo male presents from ALF for reassessment of oropharyngeal  swallowing function. PMH is significant for dementia, neck  surgery, reflux, hiatal hernia, esophageal stretching, and  unintentional weight loss. Most recent  MBS 11/08/13 recommended  Dys 1 and nectar thick liquids. Pt has since transitioned at ALF  to Dys 3 textures and thin liquids.     Assessment / Plan / Recommendation Clinical Impression  Dysphagia Diagnosis: Moderate pharyngeal phase dysphagia;Mild  cervical esophageal phase dysphagia Clinical impression: Pt demonstrates significant improvement  since most recent MBS 11/08/13, although continues to exhibit  decreased hyolaryngeal movement and epiglottic deflection  secondary to cervical hardware present. Pt's moderate pharyngeal  dysphagia due to the above results in small amounts  of thin and  nectar thick liquids being penetrated above the vocal folds,  which is mostly (although not entirely) cleared with Max cues for  a throat clear. A chin tuck was effective at increasing airway  protection as evidenced by NO penetration/aspiration with nectar  thick liquids, and reduced amount of penetrates with thin liquid.  A chin tuck was also effective at reducing vallecular residuals  with all consistencies tested. Recommend Dys 3 (mechanical soft)  textures and nectar thick liquids with use of chin tuck, with  meds crushed in puree. Pending cognitive status and ability to  follow instructions, pt may be an appropriate candidate for the  water protocol.  Skilled intervention included multimodal cueing  for utilization of compensatory strategies as well as education  related to results and recommendations with patient, wife, and  caregiver present. Recommend continued f/u with [facility] SLP.    Treatment Recommendation  Defer treatment plan to SLP at (Comment) [facility])    Diet Recommendation Dysphagia 3 (Mechanical Soft);Nectar-thick  liquid (water protocol? defer to treating SLP)   Liquid Administration via: Cup;No straw Medication Administration: Crushed with puree Supervision: Patient able to self feed;Full supervision/cueing  for compensatory strategies Compensations: Slow rate;Small sips/bites;Multiple dry swallows  after each bite/sip;Clear throat intermittently Postural Changes and/or Swallow Maneuvers: Seated upright 90  degrees;Upright 30-60 min after meal;Chin tuck    Other  Recommendations Oral Care Recommendations: Oral care BID   Follow Up Recommendations  Home health SLP    Frequency and Duration        Pertinent Vitals/Pain n/a    SLP Swallow Goals     General Date of Onset:  (past four weeks) HPI: 80 yo male presents from ALF for reassessment of  oropharyngeal swallowing function. PMH is significant for  dementia, neck surgery, reflux, hiatal hernia, esophageal  stretching, and unintentional  weight loss. Most recent MBS  11/08/13 recommended Dys 1 and nectar thick liquids. Pt has since  transitioned at ALF to Dys 3 textures and thin liquids. Type of Study: Modified Barium Swallowing Study Reason for Referral: Objectively evaluate swallowing function Previous Swallow Assessment: see HPI Diet Prior to this Study: Dysphagia 3 (soft);Thin liquids Respiratory Status: Room air History of Recent Intubation: No Behavior/Cognition: Alert;Cooperative;Pleasant mood;Requires  cueing Oral Cavity - Dentition: Poor condition Oral Motor / Sensory Function: Within functional limits Self-Feeding Abilities: Able to feed self Patient Positioning: Upright in chair Baseline Vocal Quality: Clear Volitional Cough: Strong Volitional Swallow: Able to elicit Anatomy:  (posterior and anterior cervical hardware C4-C7) Pharyngeal Secretions: Not observed secondary MBS    Reason for Referral Objectively evaluate swallowing function   Oral Phase Oral Preparation/Oral Phase Oral Phase: WFL   Pharyngeal Phase Pharyngeal Phase Pharyngeal Phase: Impaired Pharyngeal - Nectar Pharyngeal - Nectar Teaspoon: Not tested Pharyngeal - Nectar Cup: Reduced epiglottic inversion;Reduced  airway/laryngeal closure;Reduced anterior laryngeal  mobility;Penetration/Aspiration during swallow;Pharyngeal residue  - valleculae;Compensatory strategies attempted (Comment) (chin  tuck effective at increasing airway protection) Penetration/Aspiration details (nectar  cup): Material enters  airway, remains ABOVE vocal cords and not ejected out (does not  enter with chin tuck) Pharyngeal - Nectar Straw: Not tested Pharyngeal - Thin Pharyngeal - Thin Cup: Reduced epiglottic inversion;Reduced  airway/laryngeal closure;Reduced anterior laryngeal  mobility;Penetration/Aspiration during swallow;Pharyngeal residue  - valleculae;Compensatory strategies attempted (Comment) (chin  tuck decreases but doesn't prevent penetration) Penetration/Aspiration details (thin cup):  Material enters  airway, remains ABOVE vocal cords and not ejected out Pharyngeal - Thin Straw: Not tested Pharyngeal - Solids Pharyngeal - Puree: Reduced epiglottic inversion;Reduced  airway/laryngeal closure;Reduced anterior laryngeal  mobility;Pharyngeal residue - valleculae;Compensatory strategies  attempted (Comment) (chin tuck decreases residuals) Penetration/Aspiration details (puree): Material does not enter  airway Pharyngeal - Mechanical Soft: Reduced epiglottic  inversion;Reduced anterior laryngeal mobility;Reduced  airway/laryngeal closure;Pharyngeal residue -  valleculae;Compensatory strategies attempted (Comment) (chin tuck  reduces residuals) Pharyngeal - Regular: Not tested  Cervical Esophageal Phase    GO    Cervical Esophageal Phase Cervical Esophageal Phase: Impaired (impacted by cervical  hardware)    Functional Assessment Tool Used: clinical judgement Functional Limitations: Swallowing Swallow Current Status ([id number]): At least 20 percent but less than  40 percent impaired, limited or restricted Swallow Goal Status [phone]): At least 20 percent but less than 40  percent impaired, limited or restricted Swallow Discharge Status [phone]): At least 20 percent but less  than 40 percent impaired, limited or restricted     Germain Osgood, M.A. CCC-SLP [phone]  Germain Osgood 11/17/2013, 12:14 PM          Subjective: Patient is pleasantly confused. Denies any headache, chest pain, shortness breath, nausea, vomiting, abdominal pain, diarrhea  Objective: Filed Vitals:   12/13/13 1254 12/13/13 2109 12/14/13 0700 12/14/13 1017  BP: 142/73 167/91 174/85 150/70  Pulse: 60 70 60   Temp: 98.6 F (37 C) 97.6 F (36.4 C) 97.6 F (36.4 C)   TempSrc: Axillary Oral Oral   Resp: 20  20   Height:      Weight:   62.324 kg (137 lb 6.4 oz)   SpO2: 97% 98% 97%     Intake/Output Summary (Last 24 hours) at 12/14/13 1314 Last data filed at 12/14/13 1000  Gross per 24 hour  Intake   2280 ml   Output    925 ml  Net   1355 ml   Weight change: -4.376 kg (-9 lb 10.4 oz) Exam:   General:  Pt is alert, follows commands appropriately, not in acute distress  HEENT: No icterus, No thrush,  [place]/AT  Cardiovascular: RRR, S1/S2, no rubs, no gallops  Respiratory: Diminished breath sounds at the bases but clear to auscultation. No wheezing.  Abdomen: Soft/+BS, non tender, non distended, no guarding  Extremities: No edema, No lymphangitis, No petechiae, No rashes, no synovitis  Data Reviewed: Basic Metabolic Panel:  Recent Labs Lab 12/12/13 0028 12/12/13 1120 12/13/13 0435  NA 139  --  139  K 4.1  --  4.1  CL 100  --  105  CO2 26  --  23  GLUCOSE 88  --  88  BUN 17  --  20  CREATININE 0.89 0.86 0.85  CALCIUM 9.6  --  8.7  MG  --  1.9  --    Liver Function Tests:  Recent Labs Lab 12/12/13 0028  AST 14  ALT 6  ALKPHOS 98  BILITOT 0.5  PROT 6.9  ALBUMIN 3.1*    Recent Labs Lab 12/12/13 0028  LIPASE 23    Recent Labs Lab 12/13/13  0955  AMMONIA 34   CBC:  Recent Labs Lab 12/12/13 0028 12/12/13 1120 12/13/13 0435  WBC 8.5 6.4 5.6  NEUTROABS 5.8  --   --   HGB 12.6* 12.0* 10.8*  HCT 38.1* 36.0* 31.8*  MCV 90.1 90.7 89.8  PLT 135* 111* 120*   Cardiac Enzymes:  Recent Labs Lab 12/12/13 0028  TROPONINI <0.30   BNP: No components found with this basename: POCBNP,  CBG: No results found for this basename: GLUCAP,  in the last 168 hours  Recent Results (from the past 240 hour(s))  URINE CULTURE     Status: None   Collection Time    12/12/13 12:27 AM      Result Value Ref Range Status   Specimen Description URINE, CLEAN CATCH   Final   Special Requests NONE   Final   Culture  Setup Time     Final   Value: 12/12/2013 10:22     Performed at [facility] Count     Final   Value: 55,000 COLONIES/ML     Performed at [facility]   Culture     Final   Value: Multiple bacterial morphotypes present, none predominant.  Suggest appropriate recollection if clinically indicated.     Performed at [facility]   Report Status 12/13/2013 FINAL   Final  MRSA PCR SCREENING     Status: Abnormal   Collection Time    12/12/13 11:15 AM      Result Value Ref Range Status   MRSA by PCR POSITIVE (*) NEGATIVE Final   Comment:            The GeneXpert MRSA Assay (FDA     approved for NASAL specimens     only), is one component of a     comprehensive MRSA colonization     surveillance program. It is not     intended to diagnose MRSA     infection nor to guide or     monitor treatment for     MRSA infections.     RESULT CALLED TO, READ BACK BY AND VERIFIED WITH:     L. ANDERSON RN AT 1250 ON 09.14.15 BY SHUEA  CULTURE, BLOOD (ROUTINE X 2)     Status: None   Collection Time    12/13/13  9:55 AM      Result Value Ref Range Status   Specimen Description BLOOD RIGHT ARM   Final   Special Requests BOTTLES DRAWN AEROBIC AND ANAEROBIC 5CC   Final   Culture  Setup Time     Final   Value: 12/13/2013 12:11     Performed at [facility]   Culture     Final   Value:        BLOOD CULTURE RECEIVED NO GROWTH TO DATE CULTURE WILL BE HELD FOR 5 DAYS BEFORE ISSUING A FINAL NEGATIVE REPORT     Performed at [facility]   Report Status PENDING   Incomplete  CULTURE, BLOOD (ROUTINE X 2)     Status: None   Collection Time    12/13/13 10:05 AM      Result Value Ref Range Status   Specimen Description BLOOD RIGHT HAND   Final   Special Requests     Final   Value: BOTTLES DRAWN AEROBIC AND ANAEROBIC 2CC ANA 3CC AER   Culture  Setup Time     Final   Value: 12/13/2013 12:09  Performed at [facility]     Final   Value:        BLOOD CULTURE RECEIVED NO GROWTH TO DATE CULTURE WILL BE HELD FOR 5 DAYS BEFORE ISSUING A FINAL NEGATIVE REPORT     Note: Culture results may be compromised due to an inadequate volume of blood received in culture bottles.     Performed at [facility]    Report Status PENDING   Incomplete     Scheduled Meds: . aspirin EC  81 mg Oral Daily  . Chlorhexidine Gluconate Cloth  6 each Topical Q0600  . divalproex  500 mg Oral BID  . docusate sodium  100 mg Oral BID  . enoxaparin (LOVENOX) injection  40 mg Subcutaneous Q24H  . lactobacillus acidophilus & bulgar  1 tablet Oral Daily  . lidocaine  1 patch Transdermal Q24H  . lisinopril  5 mg Oral Daily  . memantine  10 mg Oral BID  . metoprolol  50 mg Oral BID  . mupirocin ointment  1 application Nasal BID  . pantoprazole  80 mg Oral BID  . potassium chloride  20 mEq Oral Daily  . QUEtiapine  100 mg Oral QHS  . sodium chloride  3 mL Intravenous Q12H  . tiotropium  18 mcg Inhalation Daily   Continuous Infusions: . sodium chloride 75 mL/hr at 12/14/13 0257     Aadam Zhen, DO  [facility] Pager [phone]  If 7PM-7AM, please contact night-coverage [email or web address] Password [email or web address] 12/14/2013, 1:14 PM   LOS: 3 days

## 2013-12-14 NOTE — Progress Notes (Signed)
Physical Therapy Treatment Patient Details Name: Jerry Kerr MRN: [id number] DOB: 02-27-1934 Today's Date: 12/14/2013    History of Present Illness With history of Alzheimer's dementia, COPD, hypertension, anxiety and depression, prior history of substance abuse, gastroesophageal reflux disease, Barrett's esophagus who lives at [facility] retirement home and has 24-hour care presented to the ED with a two-day history of altered mental status and falls    PT Comments    Patient awake but agitated during PT efforts for mobilizing patient. Plans for SNF.  Follow Up Recommendations  SNF     Equipment Recommendations       Recommendations for Other Services       Precautions / Restrictions Precautions Precautions: Fall    Mobility  Bed Mobility Overal bed mobility: +2 for physical assistance;+ 2 for safety/equipment;Needs Assistance Bed Mobility: Supine to Sit;Sit to Supine     Supine to sit: Mod assist;+2 for safety/equipment;+2 for physical assistance Sit to supine: +2 for safety/equipment;Mod assist;HOB elevated   General bed mobility comments: pt attempting to get up before  bed ready, then got self back down, then got up again, difficult to redirect.  Transfers Overall transfer level: Needs assistance Equipment used: Rolling walker (2 wheeled);2 person hand held assist Transfers: Sit to/from Stand           General transfer comment: pt would not follow to use RW, stood then  sat back down, does not follow commqands, getting more agitated  Ambulation/Gait                 Stairs            Wheelchair Mobility    Modified Rankin (Stroke Patients Only)       Balance                                    Cognition Arousal/Alertness: Awake/alert Behavior During Therapy: Agitated;Restless                   General Comments: pt aggrevated with  PT after family arrived, did not want to get up. redirected by daughter     Exercises      General Comments        Pertinent Vitals/Pain Pain Assessment: Faces Faces Pain Scale: Hurts even more Pain Location: back    Home Living                      Prior Function            PT Goals (current goals can now be found in the care plan section) Progress towards PT goals: Progressing toward goals    Frequency  Min 3X/week    PT Plan Current plan remains appropriate    Co-evaluation             End of Session   Activity Tolerance: Treatment limited secondary to agitation Patient left: in bed;with call bell/phone within reach;with bed alarm set;with family/visitor present     Time: 1157-2620 PT Time Calculation (min): 23 min  Charges:  $Therapeutic Activity: 23-37 mins                    G Codes:      [name] 12/14/2013, 5:31 PM

## 2013-12-15 DIAGNOSIS — F0391 Unspecified dementia with behavioral disturbance: Secondary | ICD-10-CM

## 2013-12-15 DIAGNOSIS — R1310 Dysphagia, unspecified: Secondary | ICD-10-CM

## 2013-12-15 DIAGNOSIS — F03918 Unspecified dementia, unspecified severity, with other behavioral disturbance: Secondary | ICD-10-CM

## 2013-12-15 DIAGNOSIS — R4182 Altered mental status, unspecified: Secondary | ICD-10-CM

## 2013-12-15 LAB — BASIC METABOLIC PANEL
Anion gap: 18 — ABNORMAL HIGH (ref 5–15)
BUN: 7 mg/dL (ref 6–23)
CO2: 18 mEq/L — ABNORMAL LOW (ref 19–32)
CREATININE: 0.74 mg/dL (ref 0.50–1.35)
Calcium: 9.5 mg/dL (ref 8.4–10.5)
Chloride: 103 mEq/L (ref 96–112)
GFR, EST NON AFRICAN AMERICAN: 86 mL/min — AB (ref >90)
Glucose, Bld: 88 mg/dL (ref 70–99)
Potassium: 3.6 mEq/L — ABNORMAL LOW (ref 3.7–5.3)
Sodium: 139 mEq/L (ref 137–147)

## 2013-12-15 LAB — CBC
HCT: 34.2 % — ABNORMAL LOW (ref 39.0–52.0)
Hemoglobin: 11.6 g/dL — ABNORMAL LOW (ref 13.0–17.0)
MCH: 30 pg (ref 26.0–34.0)
MCHC: 33.9 g/dL (ref 30.0–36.0)
MCV: 88.4 fL (ref 78.0–100.0)
PLATELETS: 122 10*3/uL — AB (ref 150–400)
RBC: 3.87 MIL/uL — ABNORMAL LOW (ref 4.22–5.81)
RDW: 12.8 % (ref 11.5–15.5)
WBC: 5.6 10*3/uL (ref 4.0–10.5)

## 2013-12-15 MED ORDER — HALOPERIDOL LACTATE 5 MG/ML IJ SOLN
5.0000 mg | Freq: Once | INTRAMUSCULAR | Status: AC
  Administered 2013-12-15: 5 mg via INTRAVENOUS
  Filled 2013-12-15: qty 1

## 2013-12-15 MED ORDER — LORAZEPAM 0.5 MG PO TABS
0.5000 mg | ORAL_TABLET | Freq: Two times a day (BID) | ORAL | Status: DC
  Administered 2013-12-15 – 2013-12-16 (×2): 0.5 mg via ORAL
  Filled 2013-12-15 (×2): qty 1

## 2013-12-15 MED ORDER — THIAMINE HCL 100 MG/ML IJ SOLN
500.0000 mg | Freq: Three times a day (TID) | INTRAMUSCULAR | Status: DC
  Administered 2013-12-15 – 2013-12-16 (×4): 500 mg via INTRAVENOUS
  Filled 2013-12-15 (×5): qty 5

## 2013-12-15 MED ORDER — AMLODIPINE BESYLATE 5 MG PO TABS
5.0000 mg | ORAL_TABLET | Freq: Every day | ORAL | Status: DC
  Administered 2013-12-15 – 2013-12-16 (×2): 5 mg via ORAL
  Filled 2013-12-15 (×2): qty 1

## 2013-12-15 MED ORDER — HALOPERIDOL LACTATE 5 MG/ML IJ SOLN
2.0000 mg | Freq: Once | INTRAMUSCULAR | Status: AC
  Administered 2013-12-15: 2 mg via INTRAVENOUS
  Filled 2013-12-15: qty 1

## 2013-12-15 NOTE — Plan of Care (Signed)
Problem: Phase II Progression Outcomes Goal: Vital signs remain stable Outcome: Progressing BP remains slightly elevated but medications are being adjusted.

## 2013-12-15 NOTE — Care Management Note (Addendum)
    Page 1 of 1   12/16/2013     12:55:52 PM CARE MANAGEMENT NOTE 12/16/2013  Patient:  LLOYD, AYO   Account Number:  [id number]  Date Initiated:  12/12/2013  Documentation initiated by:  Gabriel Earing  Subjective/Objective Assessment:   pt admitted with Delirium     Action/Plan:   from home   Anticipated DC Date:  12/16/2013   Anticipated DC Plan:  [place]  CM consult      Choice offered to / List presented to:             Status of service:  Completed, signed off Medicare Important Message given?  YES (If response is "NO", the following Medicare IM given date fields will be blank) Date Medicare IM given:  12/14/2013 Medicare IM given by:  [facility] Date Additional Medicare IM given:   Additional Medicare IM given by:    Discharge Disposition:  HOME/SELF CARE  Per UR Regulation:  Reviewed for med. necessity/level of care/duration of stay  If discussed at [place] of Stay Meetings, dates discussed:    Comments:  12/16/13 Tunis Gentle RN,BSN NCM [place] W/24CG.NO NEEDS OR ORDERS.  12/15/13 Bre Pecina RN,BSN NCM [phone] PSYCH CONS.HAS 24HR CG @ [facility].  9/14 /15 MMcGibboney, RN, BSN

## 2013-12-15 NOTE — Progress Notes (Signed)
Informed by RN that patient's daughter now wants psychiatry consultation in addition to the neurology consultation that was already placed.  Daughter is concerned that his medications and changes in the dosing have made the patient's aggitation worse.  She wants psychiatry input on optimizing his medications to manage his aggitation.  Psychiatry consult called and requested at [place]

## 2013-12-15 NOTE — Progress Notes (Signed)
PROGRESS NOTE  Jerry Kerr KGM:[id number] DOB: 1933-09-12 DOA: 12/11/2013 PCP: Jerry Bears, MD  Assessment/Plan: Acute encephalopathy  -metabolic with worsening of his underlying dementia.  -Also on multiple medications for his anxiety and depression that could contribute due to anticholinergic side effects.  -No clinical signs of infection.  -Seems to be mildly dehydrated--improved with IVF  -possible that he may have taken extra Depakote or tramadol.  -Head CT and -CT of the cervical spine negative on admission. Cannot obtain MRI to rule out stroke as patient has a pacemaker. -Trazodone, Zoloft, and Ativan were discontinued -12/14/2013 repeat CT brain negative for acute findings  -Seroquel was increased to 100 mg at bedtime unfortunately patient continues to have sundowning with significant agitation at nighttime -Daughter requests neurology consult for further evaluation and also to review the patient's medications  -Depakote level therapeutic.  -RPR, B12 and TSH normal. Ammonia level normal.  -UA negative for pyuria -Blood culture--neg to date -Continue neuro checks. When necessary IV Haldol for agitation.  -Seen by swallow eval on admission and recommended dysphagia level III diet.  -continue seroquel 100mg  qhs mg daily and was low-dose when necessary Ativan (patient is scheduled Ativan 0.5 mg twice daily at home).  -12/14/2013--more alert, less agitated  -question component of Wernicke's encephalopathy--start thiamine 500mg  IV every 8 hrs Hypertension  Resume home blood pressure medications.  -12/15/13--Add amlodipine 5 mg daily  Compression fracture of L2 vertebrae  Tylenol and when necessary Toradol for back pain. Physical therapy once more oriented  COPD  Continue home inhalers  -clinically stable on RA Alzheimer's dementia  Patient needs assistance with some of his ADLs but is able to participate in conversation as per daughter  History of alcohol abuse    -Daughter reports that patient has quit alcohol for several months. Will Monitor  Code Status: DO NOT RESUSCITATE  Family Communication: daughter Jerry Kerr updated on phone Disposition Plan: [place] with 24hr supervision--anticipate 9/18 if no further work up by neurology          Procedures/Studies: Dg Chest 2 View  12/12/2013   CLINICAL DATA:  Altered mental status.  EXAM: CHEST  2 VIEW  COMPARISON:  Chest radiograph performed 11/07/2013  FINDINGS: The lungs are well-aerated and clear. There is no evidence of focal opacification, pleural effusion or pneumothorax. There is mild elevation of the left hemidiaphragm.  The heart is normal in size; a pacemaker is noted at the left chest wall, with leads ending at the right atrium and right ventricle. No acute osseous abnormalities are seen. Cervical spinal fusion hardware is partially imaged.  IMPRESSION: Mild elevation of the left hemidiaphragm. No acute cardiopulmonary process seen.   Electronically Signed   By: Jerry Kerr M.D.   On: 12/12/2013 04:34   Dg Lumbar Spine Complete  12/12/2013   CLINICAL DATA:  Multiple falls, with lower back pain.  EXAM: LUMBAR SPINE - COMPLETE 4+ VIEW  COMPARISON:  Lumbar spine radiographs performed 11/11/2013  FINDINGS: There is relatively stable compression deformity of vertebral body L1, and new very mild compression deformity involving the superior endplate of L2. This may be acute in nature. There is mildly worsened narrowing of the intervertebral disc space at L3-L4. Mild grade 1 retrolisthesis of L2 on L3 and of L3 on L4 appears stable from the prior study. Facet disease is noted at the lower lumbar spine.  The visualized bowel gas pattern is unremarkable in appearance; air and stool are noted  within the colon. The sacroiliac joints are within normal limits.  IMPRESSION: 1. New very mild compression deformity involving the superior endplate of L2. Given the recent nature of the prior study, this may reflect  an acute injury. No evidence of retropulsion. This involves only a portion of the superior endplate. 2. Stable chronic compression deformity at L1. Mild degenerative change noted along the lumbar spine.   Electronically Signed   By: Jerry Kerr M.D.   On: 12/12/2013 01:26   Ct Head Wo Contrast  12/14/2013   CLINICAL DATA:  Altered mental status. Recent fall with confusion and slurred speech.  EXAM: CT HEAD WITHOUT CONTRAST  TECHNIQUE: Contiguous axial images were obtained from the base of the skull through the vertex without intravenous contrast.  COMPARISON:  12/12/2013 and 03/23/2012  FINDINGS: Ventricles, cisterns and other CSF spaces are within normal. There is mild chronic ischemic microvascular disease present. There is no mass, mass effect, shift of midline structures or acute hemorrhage. There is no evidence to suggest acute infarction. Minimal calcification along the left tentorium. Remaining bones and soft tissues are unchanged.  IMPRESSION: No acute intracranial findings.  Mild chronic ischemic microvascular disease.   Electronically Signed   By: Jerry Kerr M.D.   On: 12/14/2013 09:55   Ct Head Wo Contrast  12/12/2013   CLINICAL DATA:  Multiple falls. Altered mental status. Concern for cervical spine injury.  EXAM: CT HEAD WITHOUT CONTRAST  CT CERVICAL SPINE WITHOUT CONTRAST  TECHNIQUE: Multidetector CT imaging of the head and cervical spine was performed following the standard protocol without intravenous contrast. Multiplanar CT image reconstructions of the cervical spine were also generated.  COMPARISON:  CT of the head performed 11/07/2013, and CT of the neck performed 11/08/2013  FINDINGS: CT HEAD FINDINGS  There is no evidence of acute infarction, mass lesion, or intra- or extra-axial hemorrhage on CT.  Prominence of the ventricles and sulci reflects mild cortical volume loss. Periventricular white matter change likely reflects small vessel ischemic microangiopathy. Cerebellar atrophy is  noted.  The brainstem and fourth ventricle are within normal limits. The basal ganglia are unremarkable in appearance. The cerebral hemispheres demonstrate grossly normal gray-white differentiation. No mass effect or midline shift is seen.  There is no evidence of fracture; visualized osseous structures are unremarkable in appearance. The orbits are within normal limits. The paranasal sinuses and mastoid air cells are well-aerated. No significant soft tissue abnormalities are seen.  CT CERVICAL SPINE FINDINGS  There is no evidence of fracture or subluxation. Evaluation of the dens is mildly suboptimal due to motion artifact.  The patient is status post anterior cervical spinal fusion at C3-C5, and posterior cervical spinal fusion at C3-C7. There is minimal grade 1 anterolisthesis of C7 on T1, likely reflecting chronic degenerative change. Vertebral bodies demonstrate normal height. Prevertebral soft tissues are within normal limits.  The thyroid gland is unremarkable in appearance. The visualized lung apices are clear. Calcification is noted at the carotid bifurcations bilaterally.  IMPRESSION: 1. No evidence of traumatic intracranial injury or fracture. 2. No evidence of fracture or subluxation along the cervical spine. 3. Mild cortical volume loss and scattered small vessel ischemic microangiopathy. 4. Postoperative change along the cervical spine. Minimal associated degenerative change seen. 5. Calcification at the carotid bifurcations bilaterally. Carotid ultrasound would be helpful for further evaluation, when and as deemed clinically appropriate.   Electronically Signed   By: Jerry Kerr M.D.   On: 12/12/2013 01:17   Ct Cervical Spine Wo Contrast  12/12/2013   CLINICAL DATA:  Multiple falls. Altered mental status. Concern for cervical spine injury.  EXAM: CT HEAD WITHOUT CONTRAST  CT CERVICAL SPINE WITHOUT CONTRAST  TECHNIQUE: Multidetector CT imaging of the head and cervical spine was performed  following the standard protocol without intravenous contrast. Multiplanar CT image reconstructions of the cervical spine were also generated.  COMPARISON:  CT of the head performed 11/07/2013, and CT of the neck performed 11/08/2013  FINDINGS: CT HEAD FINDINGS  There is no evidence of acute infarction, mass lesion, or intra- or extra-axial hemorrhage on CT.  Prominence of the ventricles and sulci reflects mild cortical volume loss. Periventricular white matter change likely reflects small vessel ischemic microangiopathy. Cerebellar atrophy is noted.  The brainstem and fourth ventricle are within normal limits. The basal ganglia are unremarkable in appearance. The cerebral hemispheres demonstrate grossly normal gray-white differentiation. No mass effect or midline shift is seen.  There is no evidence of fracture; visualized osseous structures are unremarkable in appearance. The orbits are within normal limits. The paranasal sinuses and mastoid air cells are well-aerated. No significant soft tissue abnormalities are seen.  CT CERVICAL SPINE FINDINGS  There is no evidence of fracture or subluxation. Evaluation of the dens is mildly suboptimal due to motion artifact.  The patient is status post anterior cervical spinal fusion at C3-C5, and posterior cervical spinal fusion at C3-C7. There is minimal grade 1 anterolisthesis of C7 on T1, likely reflecting chronic degenerative change. Vertebral bodies demonstrate normal height. Prevertebral soft tissues are within normal limits.  The thyroid gland is unremarkable in appearance. The visualized lung apices are clear. Calcification is noted at the carotid bifurcations bilaterally.  IMPRESSION: 1. No evidence of traumatic intracranial injury or fracture. 2. No evidence of fracture or subluxation along the cervical spine. 3. Mild cortical volume loss and scattered small vessel ischemic microangiopathy. 4. Postoperative change along the cervical spine. Minimal associated  degenerative change seen. 5. Calcification at the carotid bifurcations bilaterally. Carotid ultrasound would be helpful for further evaluation, when and as deemed clinically appropriate.   Electronically Signed   By: Jerry Kerr M.D.   On: 12/12/2013 01:17   Portable Chest 1 View  12/13/2013   CLINICAL DATA:  Altered mental status, pneumonia  EXAM: PORTABLE CHEST - 1 VIEW  COMPARISON:  Radiograph 12/12/2013  FINDINGS: Left-sided pacemaker overlies normal cardiac silhouette. There is mild interstitial edema pattern increased compared to prior. Lungs are hyperinflated. No pneumothorax.  IMPRESSION: Mild interstitial edema.   Electronically Signed   By: Suzy Bouchard M.D.   On: 12/13/2013 07:44   Dg Swallowing Func-speech Pathology  11/17/2013   Loletha Grayer, [place]     11/17/2013 12:14 PM Objective Swallowing Evaluation: Modified Barium Swallowing Study   Patient Details  Name: Oreoluwa Aigner MRN: [id number] Date of Birth: 07-May-1933  Today's Date: 11/17/2013 Time: 6283-6629 SLP Time Calculation (min): 27 min  Past Medical History:  Past Medical History  Diagnosis Date  . Asthma   . Dementia   . Back pain   . COPD (chronic obstructive pulmonary disease)   . Hypertension   . Pacemaker   . Anxiety   . Depression   . Cancer     SKIN, PROSTATE  . GERD (gastroesophageal reflux disease)   . Substance abuse     ETOH   Past Surgical History:  Past Surgical History  Procedure Laterality Date  . Neck surgery    . Upper gastrointestinal endoscopy    . Colonoscopy    .  Ankle surgery    . Skin graft    . Pacemaker insertion    . Tonsillectomy     HPI:  80 yo male presents from ALF for reassessment of oropharyngeal  swallowing function. PMH is significant for dementia, neck  surgery, reflux, hiatal hernia, esophageal stretching, and  unintentional weight loss. Most recent MBS 11/08/13 recommended  Dys 1 and nectar thick liquids. Pt has since transitioned at ALF  to Dys 3 textures and thin liquids.     Assessment / Plan  / Recommendation Clinical Impression  Dysphagia Diagnosis: Moderate pharyngeal phase dysphagia;Mild  cervical esophageal phase dysphagia Clinical impression: Pt demonstrates significant improvement  since most recent MBS 11/08/13, although continues to exhibit  decreased hyolaryngeal movement and epiglottic deflection  secondary to cervical hardware present. Pt's moderate pharyngeal  dysphagia due to the above results in small amounts of thin and  nectar thick liquids being penetrated above the vocal folds,  which is mostly (although not entirely) cleared with Max cues for  a throat clear. A chin tuck was effective at increasing airway  protection as evidenced by NO penetration/aspiration with nectar  thick liquids, and reduced amount of penetrates with thin liquid.  A chin tuck was also effective at reducing vallecular residuals  with all consistencies tested. Recommend Dys 3 (mechanical soft)  textures and nectar thick liquids with use of chin tuck, with  meds crushed in puree. Pending cognitive status and ability to  follow instructions, pt may be an appropriate candidate for the  water protocol.  Skilled intervention included multimodal cueing  for utilization of compensatory strategies as well as education  related to results and recommendations with patient, wife, and  caregiver present. Recommend continued f/u with [facility] SLP.    Treatment Recommendation  Defer treatment plan to SLP at (Comment) [facility])    Diet Recommendation Dysphagia 3 (Mechanical Soft);Nectar-thick  liquid (water protocol? defer to treating SLP)   Liquid Administration via: Cup;No straw Medication Administration: Crushed with puree Supervision: Patient able to self feed;Full supervision/cueing  for compensatory strategies Compensations: Slow rate;Small sips/bites;Multiple dry swallows  after each bite/sip;Clear throat intermittently Postural Changes and/or Swallow Maneuvers: Seated upright 90  degrees;Upright 30-60 min after meal;Chin tuck     Other  Recommendations Oral Care Recommendations: Oral care BID   Follow Up Recommendations  Home health SLP    Frequency and Duration        Pertinent Vitals/Pain n/a    SLP Swallow Goals     General Date of Onset:  (past four weeks) HPI: 80 yo male presents from ALF for reassessment of  oropharyngeal swallowing function. PMH is significant for  dementia, neck surgery, reflux, hiatal hernia, esophageal  stretching, and unintentional weight loss. Most recent MBS  11/08/13 recommended Dys 1 and nectar thick liquids. Pt has since  transitioned at ALF to Dys 3 textures and thin liquids. Type of Study: Modified Barium Swallowing Study Reason for Referral: Objectively evaluate swallowing function Previous Swallow Assessment: see HPI Diet Prior to this Study: Dysphagia 3 (soft);Thin liquids Respiratory Status: Room air History of Recent Intubation: No Behavior/Cognition: Alert;Cooperative;Pleasant mood;Requires  cueing Oral Cavity - Dentition: Poor condition Oral Motor / Sensory Function: Within functional limits Self-Feeding Abilities: Able to feed self Patient Positioning: Upright in chair Baseline Vocal Quality: Clear Volitional Cough: Strong Volitional Swallow: Able to elicit Anatomy:  (posterior and anterior cervical hardware C4-C7) Pharyngeal Secretions: Not observed secondary MBS    Reason for Referral Objectively evaluate swallowing function   Oral Phase  Oral Preparation/Oral Phase Oral Phase: WFL   Pharyngeal Phase Pharyngeal Phase Pharyngeal Phase: Impaired Pharyngeal - Nectar Pharyngeal - Nectar Teaspoon: Not tested Pharyngeal - Nectar Cup: Reduced epiglottic inversion;Reduced  airway/laryngeal closure;Reduced anterior laryngeal  mobility;Penetration/Aspiration during swallow;Pharyngeal residue  - valleculae;Compensatory strategies attempted (Comment) (chin  tuck effective at increasing airway protection) Penetration/Aspiration details (nectar cup): Material enters  airway, remains ABOVE vocal cords and not  ejected out (does not  enter with chin tuck) Pharyngeal - Nectar Straw: Not tested Pharyngeal - Thin Pharyngeal - Thin Cup: Reduced epiglottic inversion;Reduced  airway/laryngeal closure;Reduced anterior laryngeal  mobility;Penetration/Aspiration during swallow;Pharyngeal residue  - valleculae;Compensatory strategies attempted (Comment) (chin  tuck decreases but doesn't prevent penetration) Penetration/Aspiration details (thin cup): Material enters  airway, remains ABOVE vocal cords and not ejected out Pharyngeal - Thin Straw: Not tested Pharyngeal - Solids Pharyngeal - Puree: Reduced epiglottic inversion;Reduced  airway/laryngeal closure;Reduced anterior laryngeal  mobility;Pharyngeal residue - valleculae;Compensatory strategies  attempted (Comment) (chin tuck decreases residuals) Penetration/Aspiration details (puree): Material does not enter  airway Pharyngeal - Mechanical Soft: Reduced epiglottic  inversion;Reduced anterior laryngeal mobility;Reduced  airway/laryngeal closure;Pharyngeal residue -  valleculae;Compensatory strategies attempted (Comment) (chin tuck  reduces residuals) Pharyngeal - Regular: Not tested  Cervical Esophageal Phase    GO    Cervical Esophageal Phase Cervical Esophageal Phase: Impaired (impacted by cervical  hardware)    Functional Assessment Tool Used: clinical judgement Functional Limitations: Swallowing Swallow Current Status (N8295): At least 20 percent but less than  40 percent impaired, limited or restricted Swallow Goal Status [id number]): At least 20 percent but less than 40  percent impaired, limited or restricted Swallow Discharge Status [phone]): At least 20 percent but less  than 40 percent impaired, limited or restricted     Germain Osgood, M.A. CCC-SLP [phone]  Germain Osgood 11/17/2013, 12:14 PM          Subjective:  patient has significant agitation overnight, but is more: This morning. Denies any headache, chest pain, shortness breath, vomiting, diarrhea.  No reports of respiratory distress.  Objective: Filed Vitals:   12/15/13 0449 12/15/13 0632 12/15/13 0756 12/15/13 0810  BP:  191/81  166/77  Pulse: 63     Temp:  98.3 F (36.8 C)    TempSrc:  Oral    Resp: 22     Height:      Weight: 63.912 kg (140 lb 14.4 oz)     SpO2: 96%  95%     Intake/Output Summary (Last 24 hours) at 12/15/13 1053 Last data filed at 12/15/13 0900  Gross per 24 hour  Intake   2235 ml  Output   1150 ml  Net   1085 ml   Weight change: 1.588 kg (3 lb 8 oz) Exam:   General:  Pt is alert, follows commands appropriately, not in acute distress  HEENT: No icterus, No thrush,  /AT  Cardiovascular: RRR, S1/S2, no rubs, no gallops  Respiratory: bibasilar crackles. No wheezing. Good air movement.   Abdomen: Soft/+BS, non tender, non distended, no guarding  Extremities: No edema, No lymphangitis, No petechiae, No rashes, no synovitis  Data Reviewed: Basic Metabolic Panel:  Recent Labs Lab 12/12/13 0028 12/12/13 1120 12/13/13 0435 12/15/13 0525  NA 139  --  139 139  K 4.1  --  4.1 3.6*  CL 100  --  105 103  CO2 26  --  23 18*  GLUCOSE 88  --  88 88  BUN 17  --  20 7  CREATININE 0.89 0.86  0.85 0.74  CALCIUM 9.6  --  8.7 9.5  MG  --  1.9  --   --    Liver Function Tests:  Recent Labs Lab 12/12/13 0028  AST 14  ALT 6  ALKPHOS 98  BILITOT 0.5  PROT 6.9  ALBUMIN 3.1*    Recent Labs Lab 12/12/13 0028  LIPASE 23    Recent Labs Lab 12/13/13 0955  AMMONIA 34   CBC:  Recent Labs Lab 12/12/13 0028 12/12/13 1120 12/13/13 0435 12/15/13 0525  WBC 8.5 6.4 5.6 5.6  NEUTROABS 5.8  --   --   --   HGB 12.6* 12.0* 10.8* 11.6*  HCT 38.1* 36.0* 31.8* 34.2*  MCV 90.1 90.7 89.8 88.4  PLT 135* 111* 120* 122*   Cardiac Enzymes:  Recent Labs Lab 12/12/13 0028  TROPONINI <0.30   BNP: No components found with this basename: POCBNP,  CBG: No results found for this basename: GLUCAP,  in the last 168 hours  Recent Results (from  the past 240 hour(s))  URINE CULTURE     Status: None   Collection Time    12/12/13 12:27 AM      Result Value Ref Range Status   Specimen Description URINE, CLEAN CATCH   Final   Special Requests NONE   Final   Culture  Setup Time     Final   Value: 12/12/2013 10:22     Performed at [facility] Count     Final   Value: 55,000 COLONIES/ML     Performed at [facility]   Culture     Final   Value: Multiple bacterial morphotypes present, none predominant. Suggest appropriate recollection if clinically indicated.     Performed at [facility]   Report Status 12/13/2013 FINAL   Final  MRSA PCR SCREENING     Status: Abnormal   Collection Time    12/12/13 11:15 AM      Result Value Ref Range Status   MRSA by PCR POSITIVE (*) NEGATIVE Final   Comment:            The GeneXpert MRSA Assay (FDA     approved for NASAL specimens     only), is one component of a     comprehensive MRSA colonization     surveillance program. It is not     intended to diagnose MRSA     infection nor to guide or     monitor treatment for     MRSA infections.     RESULT CALLED TO, READ BACK BY AND VERIFIED WITH:     L. ANDERSON RN AT 1250 ON 09.14.15 BY SHUEA  CULTURE, BLOOD (ROUTINE X 2)     Status: None   Collection Time    12/13/13  9:55 AM      Result Value Ref Range Status   Specimen Description BLOOD RIGHT ARM   Final   Special Requests BOTTLES DRAWN AEROBIC AND ANAEROBIC 5CC   Final   Culture  Setup Time     Final   Value: 12/13/2013 12:11     Performed at [facility]   Culture     Final   Value:        BLOOD CULTURE RECEIVED NO GROWTH TO DATE CULTURE WILL BE HELD FOR 5 DAYS BEFORE ISSUING A FINAL NEGATIVE REPORT     Performed at [facility]   Report Status PENDING   Incomplete  CULTURE, BLOOD (  ROUTINE X 2)     Status: None   Collection Time    12/13/13 10:05 AM      Result Value Ref Range Status   Specimen Description BLOOD RIGHT HAND    Final   Special Requests     Final   Value: BOTTLES DRAWN AEROBIC AND ANAEROBIC 2CC ANA 3CC AER   Culture  Setup Time     Final   Value: 12/13/2013 12:09     Performed at [facility]   Culture     Final   Value:        BLOOD CULTURE RECEIVED NO GROWTH TO DATE CULTURE WILL BE HELD FOR 5 DAYS BEFORE ISSUING A FINAL NEGATIVE REPORT     Note: Culture results may be compromised due to an inadequate volume of blood received in culture bottles.     Performed at [facility]   Report Status PENDING   Incomplete     Scheduled Meds: . amLODipine  5 mg Oral Daily  . aspirin EC  81 mg Oral Daily  . Chlorhexidine Gluconate Cloth  6 each Topical Q0600  . divalproex  500 mg Oral BID  . docusate sodium  100 mg Oral BID  . enoxaparin (LOVENOX) injection  40 mg Subcutaneous Q24H  . lactobacillus acidophilus & bulgar  1 tablet Oral Daily  . lidocaine  1 patch Transdermal Q24H  . lisinopril  10 mg Oral Daily  . memantine  10 mg Oral BID  . metoprolol  50 mg Oral BID  . mupirocin ointment  1 application Nasal BID  . pantoprazole  80 mg Oral BID  . potassium chloride  20 mEq Oral Daily  . QUEtiapine  100 mg Oral QHS  . sodium chloride  3 mL Intravenous Q12H  . thiamine IV  500 mg Intravenous 3 times per day  . tiotropium  18 mcg Inhalation Daily   Continuous Infusions: . sodium chloride 75 mL/hr at 12/15/13 0455     Kharter Sestak, DO  [facility] Pager [phone]  If 7PM-7AM, please contact night-coverage [email or web address] Password [email or web address] 12/15/2013, 10:53 AM   LOS: 4 days

## 2013-12-15 NOTE — Progress Notes (Signed)
Occupational Therapy Treatment Patient Details Name: Jerry Kerr MRN: [id number] DOB: 09-18-1933 Today's Date: 12/15/2013    History of present illness With history of Alzheimer's dementia, COPD, hypertension, anxiety and depression, prior history of substance abuse, gastroesophageal reflux disease, Barrett's esophagus who lives at [facility] retirement home and has 24-hour care presented to the ED with a two-day history of altered mental status and falls   OT comments  Pt easily agitated with safety recommendations given during functional transfers and performed tasks his way. Caregiver present for session. +2 for safety as pt is impulsive and doesn't give notice of when he is going to stand or sit. Will need SNF. Very high fall risk.   Follow Up Recommendations  SNF;Supervision/Assistance - 24 hour    Equipment Recommendations  3 in 1 bedside comode    Recommendations for Other Services      Precautions / Restrictions Precautions Precautions: Fall Precaution Comments: per xray report "very mild compression deformity involving the superior endplate of L2" Restrictions Weight Bearing Restrictions: No       Mobility Bed Mobility Overal bed mobility: Needs Assistance       Supine to sit: Min guard;HOB elevated        Transfers Overall transfer level: Needs assistance Equipment used: Rolling walker (2 wheeled) Transfers: Sit to/from Stand Sit to Stand: +2 safety/equipment;Min assist         General transfer comment: easily agitated with instructions given, not using walker safely.     Balance                                   ADL                           Toilet Transfer: +2 for safety/equipment;Minimal assistance;Stand-pivot;BSC             General ADL Comments: Pt initially not agreeable to working with OT but with encouragement did transfer onto [facility]. Caregiver present. Pt did not follow OT instruction on safe use of walker and  was trying to transfer onto [facility] his own way. He tends to sit back without warning and when encouraged to back up fully to the bed, pt just flopped back on bed. +2 for safety. Do not feel pt is safe to return to home. Pt did transfer to EOB well with min guard assist and rail.       Vision                     Perception     Praxis      Cognition   Behavior During Therapy: Agitated;Restless Overall Cognitive Status: Impaired/Different from baseline Area of Impairment: Safety/judgement;Attention                General Comments: pt gets aggravated easily with OT instruction and recommendation on how to perform tasks as safely as possible.     Extremity/Trunk Assessment               Exercises     Shoulder Instructions       General Comments      Pertinent Vitals/ Pain       Faces Pain Scale: No hurt  Home Living  Prior Functioning/Environment              Frequency Min 2X/week     Progress Toward Goals  OT Goals(current goals can now be found in the care plan section)  Progress towards OT goals: Not progressing toward goals - comment (gets agitated with directions given to him)     Plan Discharge plan remains appropriate    Co-evaluation                 End of Session Equipment Utilized During Treatment: Rolling walker;Gait belt   Activity Tolerance Treatment limited secondary to agitation   Patient Left in bed;with call bell/phone within reach;with family/visitor present;with bed alarm set   Nurse Communication          Time: 3354-5625 OT Time Calculation (min): 18 min  Charges: OT General Charges $OT Visit: 1 Procedure OT Treatments $Therapeutic Activity: 8-22 mins  Jules Schick [phone] 12/15/2013, 1:13 PM
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
## 2013-12-15 NOTE — Consult Note (Signed)
Reason for Consult: Increasing confusion and agitation.  HPI:                                                                                                                                          Jerry Kerr is an 80 y.o. male history of dementia, COPD, hypertension, pacemaker placement and alcohol abuse, admitted on 12/11/2013 because of increasing confusion and agitation. Patient had experienced several falls at home. No serious injury occurred. He was showing increasing confusion and agitation particularly at night. CT scan of his head was unremarkable. Laboratory studies also unremarkable with no indication of significant metabolic abnormality nor acute infectious process. His medications prior to admission included Ativan 0.5 mg twice a day, Namenda 10 mg twice a day, Seroquel 50 mg at bedtime, Zoloft 50 mg per day, Desyrel 50 mg at bedtime and Ultram 50 mg 3 times a day when necessary. Depakote level on admission was 57. He is continued to be confused and agitated since admission requiring sedating medication as well as sitters.  Past Medical History  Diagnosis Date  . Asthma   . Dementia   . Back pain   . COPD (chronic obstructive pulmonary disease)   . Hypertension   . Pacemaker   . Anxiety   . Depression   . Cancer     SKIN, PROSTATE  . GERD (gastroesophageal reflux disease)   . Substance abuse     ETOH  . Dysphagia, unspecified(787.20) 12/12/2013    Past Surgical History  Procedure Laterality Date  . Neck surgery    . Upper gastrointestinal endoscopy    . Colonoscopy    . Ankle surgery    . Skin graft    . Pacemaker insertion    . Tonsillectomy      Family History  Problem Relation Age of Onset  . CAD Father   . Heart disease Father   . Hypertension Mother   . AAA (abdominal aortic aneurysm) Mother   . Heart disease Mother     Social History:  reports that he has quit smoking. He has never used smokeless tobacco. He reports that he does not drink alcohol or  use illicit drugs.  No Known Allergies  MEDICATIONS:  I have reviewed the patient's current medications.   ROS:                                                                                                                                       History obtained from patient's daughter  General ROS: negative for - chills, fatigue, fever, night sweats, weight gain or weight loss Psychological ROS: As noted in history of present illness Ophthalmic ROS: negative for - blurry vision, double vision, eye pain or loss of vision ENT ROS: negative for - epistaxis, nasal discharge, oral lesions, sore throat, tinnitus or vertigo Allergy and Immunology ROS: negative for - hives or itchy/watery eyes Hematological and Lymphatic ROS: negative for - bleeding problems, bruising or swollen lymph nodes Endocrine ROS: negative for - galactorrhea, hair pattern changes, polydipsia/polyuria or temperature intolerance Respiratory ROS: negative for - cough, hemoptysis, shortness of breath or wheezing Cardiovascular ROS: negative for - chest pain, dyspnea on exertion, edema or irregular heartbeat Gastrointestinal ROS: negative for - abdominal pain, diarrhea, hematemesis, nausea/vomiting or stool incontinence Genito-Urinary ROS: negative for - dysuria, hematuria, incontinence or urinary frequency/urgency Musculoskeletal ROS: negative for - joint swelling or muscular weakness Neurological ROS: as noted in HPI Dermatological ROS: negative for rash and skin lesion changes   Blood pressure 166/77, pulse 63, temperature 98.3 F (36.8 C), temperature source Oral, resp. rate 22, height _0  (1.727 m), weight 63.912 kg (140 lb 14.4 oz), SpO2 95.00%.   Neurologic Examination:                                                                                                      Mental Status: Somnolent but  easy to arouse, disoriented to place and to a lesser extent time. Patient is only slightly agitated. Speech fluent without evidence of aphasia. Able to follow commands without difficulty. Cranial Nerves: II-Visual fields were normal. III/IV/VI-Pupils were equal and reacted. Extraocular movements were full and conjugate.    V/VII-no facial numbness and no facial weakness. VIII-normal. X-normal speech and symmetrical palatal movement. Motor: 5/5 bilaterally with normal tone and bulk Sensory: Normal throughout. Deep Tendon Reflexes: 2+ and symmetric. Plantars: Flexor bilaterally Cerebellar: Normal finger-to-nose testing.  No results found for this basename: cbc, bmp, coags, chol, tri, ldl, hga1c    Results for orders placed during the hospital encounter of 12/11/13 (from the past 48 hour(s))  BASIC METABOLIC PANEL     Status: Abnormal   Collection Time    12/15/13  5:25 AM  Result Value Ref Range   Sodium 139  137 - 147 mEq/L   Potassium 3.6 (*) 3.7 - 5.3 mEq/L   Chloride 103  96 - 112 mEq/L   CO2 18 (*) 19 - 32 mEq/L   Glucose, Bld 88  70 - 99 mg/dL   BUN 7  6 - 23 mg/dL   Creatinine, Ser 0.74  0.50 - 1.35 mg/dL   Calcium 9.5  8.4 - 10.5 mg/dL   GFR calc non Af Amer 86 (*) >90 mL/min   GFR calc Af Amer >90  >90 mL/min   Comment: (NOTE)     The eGFR has been calculated using the CKD EPI equation.     This calculation has not been validated in all clinical situations.     eGFR's persistently <90 mL/min signify possible Chronic Kidney     Disease.   Anion gap 18 (*) 5 - 15  CBC     Status: Abnormal   Collection Time    12/15/13  5:25 AM      Result Value Ref Range   WBC 5.6  4.0 - 10.5 K/uL   RBC 3.87 (*) 4.22 - 5.81 MIL/uL   Hemoglobin 11.6 (*) 13.0 - 17.0 g/dL   HCT 34.2 (*) 39.0 - 52.0 %   MCV 88.4  78.0 - 100.0 fL   MCH 30.0  26.0 - 34.0 pg   MCHC 33.9  30.0 - 36.0 g/dL   RDW 12.8  11.5 - 15.5 %   Platelets 122 (*) 150 - 400 K/uL    Ct Head Wo  Contrast  12/14/2013   CLINICAL DATA:  Altered mental status. Recent fall with confusion and slurred speech.  EXAM: CT HEAD WITHOUT CONTRAST  TECHNIQUE: Contiguous axial images were obtained from the base of the skull through the vertex without intravenous contrast.  COMPARISON:  12/12/2013 and 03/23/2012  FINDINGS: Ventricles, cisterns and other CSF spaces are within normal. There is mild chronic ischemic microvascular disease present. There is no mass, mass effect, shift of midline structures or acute hemorrhage. There is no evidence to suggest acute infarction. Minimal calcification along the left tentorium. Remaining bones and soft tissues are unchanged.  IMPRESSION: No acute intracranial findings.  Mild chronic ischemic microvascular disease.   Electronically Signed   By: Marin Olp M.D.   On: 12/14/2013 09:55    Assessment/Plan: 80 year old man with known dementia presenting with exacerbation of mental status with increased confusion and agitation. Etiology is unclear, but is likely multifactorial, including medications and disrupted wake-sleep cycle, as well as probable progression of dementia. Patient has no signs of acute focal deficit that would indicate possible stroke. CT scan of his head was unremarkable, as well. He has no signs of significant metabolic abnormality, more signs of current infectious process.  Recommendations: 1. Discontinue Ativan as well as Desyrel and Ultram. 2. Agree with increasing Seroquel to 100 mg at bedtime. 3. Haldol as needed for management of acute agitation. 4. Continue Namenda and Depakote at current doses. 5. No further neurodiagnostic studies are indicated at this point.  C.R. Nicole Kindred, MD [facility] [phone]  12/15/2013, 1:38 PM

## 2013-12-16 LAB — COMPREHENSIVE METABOLIC PANEL WITH GFR
ALT: 11 U/L (ref 0–53)
AST: 18 U/L (ref 0–37)
Albumin: 2.6 g/dL — ABNORMAL LOW (ref 3.5–5.2)
Alkaline Phosphatase: 90 U/L (ref 39–117)
Anion gap: 17 — ABNORMAL HIGH (ref 5–15)
BUN: 9 mg/dL (ref 6–23)
CO2: 19 meq/L (ref 19–32)
Calcium: 9.1 mg/dL (ref 8.4–10.5)
Chloride: 106 meq/L (ref 96–112)
Creatinine, Ser: 0.79 mg/dL (ref 0.50–1.35)
GFR calc Af Amer: >90 mL/min (ref >90)
GFR calc non Af Amer: 84 mL/min — ABNORMAL LOW (ref >90)
Glucose, Bld: 80 mg/dL (ref 70–99)
Potassium: 3.2 meq/L — ABNORMAL LOW (ref 3.7–5.3)
Sodium: 142 meq/L (ref 137–147)
Total Bilirubin: 0.5 mg/dL (ref 0.3–1.2)
Total Protein: 5.8 g/dL — ABNORMAL LOW (ref 6.0–8.3)

## 2013-12-16 MED ORDER — LISINOPRIL 10 MG PO TABS: 10.0000 mg | ORAL_TABLET | Freq: Every day | ORAL | Status: DC

## 2013-12-16 MED ORDER — AMLODIPINE BESYLATE 5 MG PO TABS: 5.0000 mg | ORAL_TABLET | Freq: Every day | ORAL | Status: DC

## 2013-12-16 MED ORDER — QUETIAPINE FUMARATE 100 MG PO TABS: 100.0000 mg | ORAL_TABLET | Freq: Every day | ORAL | Status: DC

## 2013-12-16 MED ORDER — VITAMIN B-1 100 MG PO TABS: 100.0000 mg | ORAL_TABLET | Freq: Every day | ORAL | Status: DC

## 2013-12-16 NOTE — Progress Notes (Signed)
Pt refused his spiriva this am. Pt in no distress at this time.

## 2013-12-16 NOTE — Progress Notes (Signed)
CSW confirmed with patient's daughter, [name] at bedside that patient plans to return home to [place] @ [place] with 24 hour caregivers. Daughter to transport.   No further CSW needs identified - CSW signing off.   Raynaldo Opitz, [facility] Clinical Social Worker cell #: [phone]

## 2013-12-16 NOTE — Progress Notes (Signed)
Patient discharged home/independent live with daughter/caregivers, discharge instructions given and explained to patient/daughter and they verbalized understanding, denies any pain/distress. Accompanied home by daughter via personal car.  Skin tear on the right arm, no sign of infection/irritation, No other wound noted. Transported to the car by staff via wheelchair.

## 2013-12-16 NOTE — Discharge Summary (Signed)
Physician Discharge Summary  Jerry Kerr AYT:[id number] DOB: 05-11-1933 DOA: 12/11/2013  PCP: [name], MD  Admit date: 12/11/2013 Discharge date: 12/16/2013  Recommendations for Outpatient Follow-up:  1. Pt has appt with new PCP on 12/20/13--Dr. Holwerda   Discharge Diagnoses:  Acute encephalopathy  -metabolic with worsening of his underlying dementia.  -Also on multiple medications for his anxiety and depression that could contribute due to anticholinergic side effects.  -No clinical signs of infection.  -Seems to be mildly dehydrated--improved with IVF  -pt may also have component of Wernicke's encephalopathy-->started thiamine 500mg  IV q 8 hrs-->more alert, less aggitation -Head CT and -CT of the cervical spine negative on admission. Cannot obtain MRI to rule out stroke as patient has a pacemaker.  -Trazodone, Zoloft, and Ativan were discontinued initially -Ativan 0.5mg  bid and zoloft restarted without worsening of aggitation -12/14/2013 repeat CT brain negative for acute findings  -Seroquel was increased to 100 mg at bedtime -Daughter requests neurology consult whom agreed with present management and did not recommend any further workup -But also request a psychiatry consultation, but decided that she would rather have the patient discharged to home rather than wait for the consult as planned hospitalization may worsen his agitation -Depakote level therapeutic.  -RPR, B12 and TSH normal. Ammonia level normal.  -UA negative for pyuria  -Blood culture--neg to date  -Continue neuro checks. When necessary IV Haldol for agitation.  -Seen by swallow eval on admission and recommended dysphagia level III diet.  -continue seroquel 100mg  qhs mg daily and was low-dose when necessary Ativan (patient is scheduled Ativan 0.5 mg twice daily at home).  -12/14/2013--more alert, less agitated  -question component of Wernicke's encephalopathy--start thiamine 500mg  IV every 8 hrs    Hypertension  Resume home blood pressure medications--metoprolol tartrate 50 mg twice a day, lisinopril 10 mg daily.  -12/15/13--Add amlodipine 5 mg daily  Compression fracture of L2 vertebrae  Tylenol and when necessary Toradol for back pain. Physical therapy once more oriented  COPD  Continue home inhalers  -clinically stable on RA  Alzheimer's dementia  Patient needs assistance with some of his ADLs but is able to participate in conversation as per daughter  History of alcohol abuse  -Daughter reports that patient has quit alcohol for several months. Will Monitor  -Patient will be discharged with thiamine 100 mg daily Code Status: DO NOT RESUSCITATE  Family Communication: daughter [name] updated on phone  Disposition Plan: [facility] with 24hr supervision--anticipate 9/18 if no further work up by neurology   Discharge Condition:  Stable Disposition: ALF  Diet:dysphagia 3 Wt Readings from Last 3 Encounters:  12/16/13 64.5 kg (142 lb 3.2 oz)  11/07/13 66.724 kg (147 lb 1.6 oz)  08/29/13 68.947 kg (152 lb)    History of present illness:  80 year old male with Alzheimer's dementia, history of alcohol abuse, COPD, hypertension, anxiety and depression, Barrett's esophagus who is currently a resident of [facility] with 24-hour care was brought to the ED by his daughter for 2 day history of altered mental status and frequent falls.  As per his daughter, patient had a for 3 days prior to admission following which he was combative and incontinent. He again fell in the bath and the next day. On the day of admission he had poor oral intake and was complaining of back pain, hallucinating and unable to identify his daughter. He also reportedly had some slurred speech.  Daughter is not sure if he took extra doses of Depakote. Patient is on tramadol  for pain but daughter does not think he may have taken extra pain medications.  Head CT, chest x-ray and UA on admission  unremarkable. Patient admitted to medical floor for further workup of his acute encephalopathy.   Consultants: Neurology--[facility]  Discharge Exam: Filed Vitals:   12/16/13 1045  BP: 164/70  Pulse: 60  Temp:   Resp:    Filed Vitals:   12/15/13 2023 12/15/13 2126 12/16/13 0606 12/16/13 1045  BP: 169/67  171/72 164/70  Pulse: 59 71 61 60  Temp: 98.2 F (36.8 C)  97.8 F (36.6 C)   TempSrc: Oral  Oral   Resp: 20  20   Height:      Weight:   64.5 kg (142 lb 3.2 oz)   SpO2: 95%  97%    General: A&O x 3, NAD, pleasant, cooperative Cardiovascular: RRR, no rub, no gallop, no S3 Respiratory: CTAB, no wheeze, no rhonchi Abdomen:soft, nontender, nondistended, positive bowel sounds Extremities: No edema, No lymphangitis, no petechiae  Discharge Instructions      Discharge Instructions   Diet - low sodium heart healthy    Complete by:  As directed      Increase activity slowly    Complete by:  As directed             Medication List    STOP taking these medications       traMADol 50 MG tablet  Commonly known as:  ULTRAM     traZODone 50 MG tablet  Commonly known as:  DESYREL      TAKE these medications       acetaminophen 500 MG tablet  Commonly known as:  TYLENOL  Take 500 mg by mouth every 8 (eight) hours as needed for moderate pain.     amLODipine 5 MG tablet  Commonly known as:  NORVASC  Take 1 tablet (5 mg total) by mouth daily.     aspirin EC 81 MG tablet  Take 81 mg by mouth daily.     CULTURELLE KIDS Pack  Take 1 packet by mouth daily.     divalproex 125 MG capsule  Commonly known as:  DEPAKOTE SPRINKLE  Take 500 mg by mouth 2 (two) times daily.     lisinopril 10 MG tablet  Commonly known as:  PRINIVIL,ZESTRIL  Take 1 tablet (10 mg total) by mouth daily.     LORazepam 0.5 MG tablet  Commonly known as:  ATIVAN  Take 0.5 mg by mouth 2 (two) times daily.     MAALOX ADVANCED 200-200-20 MG/5ML suspension  Generic drug:  alum & mag  hydroxide-simeth  Take 30 mLs by mouth every 6 (six) hours as needed for indigestion or heartburn.     magnesium hydroxide 400 MG/5ML suspension  Commonly known as:  MILK OF MAGNESIA  Take 30 mLs by mouth daily as needed for mild constipation.     memantine 10 MG tablet  Commonly known as:  NAMENDA  Take 10 mg by mouth 2 (two) times daily.     metoprolol 50 MG tablet  Commonly known as:  LOPRESSOR  Take 50 mg by mouth 2 (two) times daily.     omeprazole 40 MG capsule  Commonly known as:  PRILOSEC  Take 1 capsule (40 mg total) by mouth 2 (two) times daily.     potassium chloride 20 MEQ/15ML (10%) solution  Take 20 mEq by mouth daily.     PROAIR HFA 108 (90 BASE) MCG/ACT inhaler  Generic drug:  albuterol  Inhale 2 puffs into the lungs 4 (four) times daily as needed for wheezing or shortness of breath.     QUEtiapine 100 MG tablet  Commonly known as:  SEROQUEL  Take 1 tablet (100 mg total) by mouth at bedtime.     sertraline 50 MG tablet  Commonly known as:  ZOLOFT  Take 50 mg by mouth daily.     thiamine 100 MG tablet  Commonly known as:  VITAMIN B-1  Take 1 tablet (100 mg total) by mouth daily.     tiotropium 18 MCG inhalation capsule  Commonly known as:  SPIRIVA  Place 18 mcg into inhaler and inhale daily.         The results of significant diagnostics from this hospitalization (including imaging, microbiology, ancillary and laboratory) are listed below for reference.    Significant Diagnostic Studies: Dg Chest 2 View  12/12/2013   CLINICAL DATA:  Altered mental status.  EXAM: CHEST  2 VIEW  COMPARISON:  Chest radiograph performed 11/07/2013  FINDINGS: The lungs are well-aerated and clear. There is no evidence of focal opacification, pleural effusion or pneumothorax. There is mild elevation of the left hemidiaphragm.  The heart is normal in size; a pacemaker is noted at the left chest wall, with leads ending at the right atrium and right ventricle. No acute osseous  abnormalities are seen. Cervical spinal fusion hardware is partially imaged.  IMPRESSION: Mild elevation of the left hemidiaphragm. No acute cardiopulmonary process seen.   Electronically Signed   By: Garald Balding M.D.   On: 12/12/2013 04:34   Dg Lumbar Spine Complete  12/12/2013   CLINICAL DATA:  Multiple falls, with lower back pain.  EXAM: LUMBAR SPINE - COMPLETE 4+ VIEW  COMPARISON:  Lumbar spine radiographs performed 11/11/2013  FINDINGS: There is relatively stable compression deformity of vertebral body L1, and new very mild compression deformity involving the superior endplate of L2. This may be acute in nature. There is mildly worsened narrowing of the intervertebral disc space at L3-L4. Mild grade 1 retrolisthesis of L2 on L3 and of L3 on L4 appears stable from the prior study. Facet disease is noted at the lower lumbar spine.  The visualized bowel gas pattern is unremarkable in appearance; air and stool are noted within the colon. The sacroiliac joints are within normal limits.  IMPRESSION: 1. New very mild compression deformity involving the superior endplate of L2. Given the recent nature of the prior study, this may reflect an acute injury. No evidence of retropulsion. This involves only a portion of the superior endplate. 2. Stable chronic compression deformity at L1. Mild degenerative change noted along the lumbar spine.   Electronically Signed   By: Garald Balding M.D.   On: 12/12/2013 01:26   Ct Head Wo Contrast  12/14/2013   CLINICAL DATA:  Altered mental status. Recent fall with confusion and slurred speech.  EXAM: CT HEAD WITHOUT CONTRAST  TECHNIQUE: Contiguous axial images were obtained from the base of the skull through the vertex without intravenous contrast.  COMPARISON:  12/12/2013 and 03/23/2012  FINDINGS: Ventricles, cisterns and other CSF spaces are within normal. There is mild chronic ischemic microvascular disease present. There is no mass, mass effect, shift of midline  structures or acute hemorrhage. There is no evidence to suggest acute infarction. Minimal calcification along the left tentorium. Remaining bones and soft tissues are unchanged.  IMPRESSION: No acute intracranial findings.  Mild chronic ischemic microvascular disease.   Electronically Signed   By:  Marin Olp M.D.   On: 12/14/2013 09:55   Ct Head Wo Contrast  12/12/2013   CLINICAL DATA:  Multiple falls. Altered mental status. Concern for cervical spine injury.  EXAM: CT HEAD WITHOUT CONTRAST  CT CERVICAL SPINE WITHOUT CONTRAST  TECHNIQUE: Multidetector CT imaging of the head and cervical spine was performed following the standard protocol without intravenous contrast. Multiplanar CT image reconstructions of the cervical spine were also generated.  COMPARISON:  CT of the head performed 11/07/2013, and CT of the neck performed 11/08/2013  FINDINGS: CT HEAD FINDINGS  There is no evidence of acute infarction, mass lesion, or intra- or extra-axial hemorrhage on CT.  Prominence of the ventricles and sulci reflects mild cortical volume loss. Periventricular white matter change likely reflects small vessel ischemic microangiopathy. Cerebellar atrophy is noted.  The brainstem and fourth ventricle are within normal limits. The basal ganglia are unremarkable in appearance. The cerebral hemispheres demonstrate grossly normal gray-white differentiation. No mass effect or midline shift is seen.  There is no evidence of fracture; visualized osseous structures are unremarkable in appearance. The orbits are within normal limits. The paranasal sinuses and mastoid air cells are well-aerated. No significant soft tissue abnormalities are seen.  CT CERVICAL SPINE FINDINGS  There is no evidence of fracture or subluxation. Evaluation of the dens is mildly suboptimal due to motion artifact.  The patient is status post anterior cervical spinal fusion at C3-C5, and posterior cervical spinal fusion at C3-C7. There is minimal grade 1  anterolisthesis of C7 on T1, likely reflecting chronic degenerative change. Vertebral bodies demonstrate normal height. Prevertebral soft tissues are within normal limits.  The thyroid gland is unremarkable in appearance. The visualized lung apices are clear. Calcification is noted at the carotid bifurcations bilaterally.  IMPRESSION: 1. No evidence of traumatic intracranial injury or fracture. 2. No evidence of fracture or subluxation along the cervical spine. 3. Mild cortical volume loss and scattered small vessel ischemic microangiopathy. 4. Postoperative change along the cervical spine. Minimal associated degenerative change seen. 5. Calcification at the carotid bifurcations bilaterally. Carotid ultrasound would be helpful for further evaluation, when and as deemed clinically appropriate.   Electronically Signed   By: Garald Balding M.D.   On: 12/12/2013 01:17   Ct Cervical Spine Wo Contrast  12/12/2013   CLINICAL DATA:  Multiple falls. Altered mental status. Concern for cervical spine injury.  EXAM: CT HEAD WITHOUT CONTRAST  CT CERVICAL SPINE WITHOUT CONTRAST  TECHNIQUE: Multidetector CT imaging of the head and cervical spine was performed following the standard protocol without intravenous contrast. Multiplanar CT image reconstructions of the cervical spine were also generated.  COMPARISON:  CT of the head performed 11/07/2013, and CT of the neck performed 11/08/2013  FINDINGS: CT HEAD FINDINGS  There is no evidence of acute infarction, mass lesion, or intra- or extra-axial hemorrhage on CT.  Prominence of the ventricles and sulci reflects mild cortical volume loss. Periventricular white matter change likely reflects small vessel ischemic microangiopathy. Cerebellar atrophy is noted.  The brainstem and fourth ventricle are within normal limits. The basal ganglia are unremarkable in appearance. The cerebral hemispheres demonstrate grossly normal gray-white differentiation. No mass effect or midline shift is  seen.  There is no evidence of fracture; visualized osseous structures are unremarkable in appearance. The orbits are within normal limits. The paranasal sinuses and mastoid air cells are well-aerated. No significant soft tissue abnormalities are seen.  CT CERVICAL SPINE FINDINGS  There is no evidence of fracture or subluxation. Evaluation  of the dens is mildly suboptimal due to motion artifact.  The patient is status post anterior cervical spinal fusion at C3-C5, and posterior cervical spinal fusion at C3-C7. There is minimal grade 1 anterolisthesis of C7 on T1, likely reflecting chronic degenerative change. Vertebral bodies demonstrate normal height. Prevertebral soft tissues are within normal limits.  The thyroid gland is unremarkable in appearance. The visualized lung apices are clear. Calcification is noted at the carotid bifurcations bilaterally.  IMPRESSION: 1. No evidence of traumatic intracranial injury or fracture. 2. No evidence of fracture or subluxation along the cervical spine. 3. Mild cortical volume loss and scattered small vessel ischemic microangiopathy. 4. Postoperative change along the cervical spine. Minimal associated degenerative change seen. 5. Calcification at the carotid bifurcations bilaterally. Carotid ultrasound would be helpful for further evaluation, when and as deemed clinically appropriate.   Electronically Signed   By: Garald Balding M.D.   On: 12/12/2013 01:17   Portable Chest 1 View  12/13/2013   CLINICAL DATA:  Altered mental status, pneumonia  EXAM: PORTABLE CHEST - 1 VIEW  COMPARISON:  Radiograph 12/12/2013  FINDINGS: Left-sided pacemaker overlies normal cardiac silhouette. There is mild interstitial edema pattern increased compared to prior. Lungs are hyperinflated. No pneumothorax.  IMPRESSION: Mild interstitial edema.   Electronically Signed   By: Suzy Bouchard M.D.   On: 12/13/2013 07:44   Dg Swallowing Func-speech Pathology  11/17/2013   Loletha Grayer,  [place]     11/17/2013 12:14 PM Objective Swallowing Evaluation: Modified Barium Swallowing Study   Patient Details  Name: Jerry Kerr MRN: [id number] Date of Birth: Nov 07, 1933  Today's Date: 11/17/2013 Time: 4580-9983 SLP Time Calculation (min): 27 min  Past Medical History:  Past Medical History  Diagnosis Date  . Asthma   . Dementia   . Back pain   . COPD (chronic obstructive pulmonary disease)   . Hypertension   . Pacemaker   . Anxiety   . Depression   . Cancer     SKIN, PROSTATE  . GERD (gastroesophageal reflux disease)   . Substance abuse     ETOH   Past Surgical History:  Past Surgical History  Procedure Laterality Date  . Neck surgery    . Upper gastrointestinal endoscopy    . Colonoscopy    . Ankle surgery    . Skin graft    . Pacemaker insertion    . Tonsillectomy     HPI:  80 yo male presents from ALF for reassessment of oropharyngeal  swallowing function. PMH is significant for dementia, neck  surgery, reflux, hiatal hernia, esophageal stretching, and  unintentional weight loss. Most recent MBS 11/08/13 recommended  Dys 1 and nectar thick liquids. Pt has since transitioned at ALF  to Dys 3 textures and thin liquids.     Assessment / Plan / Recommendation Clinical Impression  Dysphagia Diagnosis: Moderate pharyngeal phase dysphagia;Mild  cervical esophageal phase dysphagia Clinical impression: Pt demonstrates significant improvement  since most recent MBS 11/08/13, although continues to exhibit  decreased hyolaryngeal movement and epiglottic deflection  secondary to cervical hardware present. Pt's moderate pharyngeal  dysphagia due to the above results in small amounts of thin and  nectar thick liquids being penetrated above the vocal folds,  which is mostly (although not entirely) cleared with Max cues for  a throat clear. A chin tuck was effective at increasing airway  protection as evidenced by NO penetration/aspiration with nectar  thick liquids, and reduced amount of penetrates with thin liquid.  A  chin  tuck was also effective at reducing vallecular residuals  with all consistencies tested. Recommend Dys 3 (mechanical soft)  textures and nectar thick liquids with use of chin tuck, with  meds crushed in puree. Pending cognitive status and ability to  follow instructions, pt may be an appropriate candidate for the  water protocol.  Skilled intervention included multimodal cueing  for utilization of compensatory strategies as well as education  related to results and recommendations with patient, wife, and  caregiver present. Recommend continued f/u with [facility] SLP.    Treatment Recommendation  Defer treatment plan to SLP at (Comment) [facility])    Diet Recommendation Dysphagia 3 (Mechanical Soft);Nectar-thick  liquid (water protocol? defer to treating SLP)   Liquid Administration via: Cup;No straw Medication Administration: Crushed with puree Supervision: Patient able to self feed;Full supervision/cueing  for compensatory strategies Compensations: Slow rate;Small sips/bites;Multiple dry swallows  after each bite/sip;Clear throat intermittently Postural Changes and/or Swallow Maneuvers: Seated upright 90  degrees;Upright 30-60 min after meal;Chin tuck    Other  Recommendations Oral Care Recommendations: Oral care BID   Follow Up Recommendations  Home health SLP    Frequency and Duration        Pertinent Vitals/Pain n/a    SLP Swallow Goals     General Date of Onset:  (past four weeks) HPI: 80 yo male presents from ALF for reassessment of  oropharyngeal swallowing function. PMH is significant for  dementia, neck surgery, reflux, hiatal hernia, esophageal  stretching, and unintentional weight loss. Most recent MBS  11/08/13 recommended Dys 1 and nectar thick liquids. Pt has since  transitioned at ALF to Dys 3 textures and thin liquids. Type of Study: Modified Barium Swallowing Study Reason for Referral: Objectively evaluate swallowing function Previous Swallow Assessment: see HPI Diet Prior to this Study: Dysphagia 3 (soft);Thin  liquids Respiratory Status: Room air History of Recent Intubation: No Behavior/Cognition: Alert;Cooperative;Pleasant mood;Requires  cueing Oral Cavity - Dentition: Poor condition Oral Motor / Sensory Function: Within functional limits Self-Feeding Abilities: Able to feed self Patient Positioning: Upright in chair Baseline Vocal Quality: Clear Volitional Cough: Strong Volitional Swallow: Able to elicit Anatomy:  (posterior and anterior cervical hardware C4-C7) Pharyngeal Secretions: Not observed secondary MBS    Reason for Referral Objectively evaluate swallowing function   Oral Phase Oral Preparation/Oral Phase Oral Phase: WFL   Pharyngeal Phase Pharyngeal Phase Pharyngeal Phase: Impaired Pharyngeal - Nectar Pharyngeal - Nectar Teaspoon: Not tested Pharyngeal - Nectar Cup: Reduced epiglottic inversion;Reduced  airway/laryngeal closure;Reduced anterior laryngeal  mobility;Penetration/Aspiration during swallow;Pharyngeal residue  - valleculae;Compensatory strategies attempted (Comment) (chin  tuck effective at increasing airway protection) Penetration/Aspiration details (nectar cup): Material enters  airway, remains ABOVE vocal cords and not ejected out (does not  enter with chin tuck) Pharyngeal - Nectar Straw: Not tested Pharyngeal - Thin Pharyngeal - Thin Cup: Reduced epiglottic inversion;Reduced  airway/laryngeal closure;Reduced anterior laryngeal  mobility;Penetration/Aspiration during swallow;Pharyngeal residue  - valleculae;Compensatory strategies attempted (Comment) (chin  tuck decreases but doesn't prevent penetration) Penetration/Aspiration details (thin cup): Material enters  airway, remains ABOVE vocal cords and not ejected out Pharyngeal - Thin Straw: Not tested Pharyngeal - Solids Pharyngeal - Puree: Reduced epiglottic inversion;Reduced  airway/laryngeal closure;Reduced anterior laryngeal  mobility;Pharyngeal residue - valleculae;Compensatory strategies  attempted (Comment) (chin tuck decreases  residuals) Penetration/Aspiration details (puree): Material does not enter  airway Pharyngeal - Mechanical Soft: Reduced epiglottic  inversion;Reduced anterior laryngeal mobility;Reduced  airway/laryngeal closure;Pharyngeal residue -  valleculae;Compensatory strategies attempted (Comment) (chin tuck  reduces residuals) Pharyngeal - Regular:  Not tested  Cervical Esophageal Phase    GO    Cervical Esophageal Phase Cervical Esophageal Phase: Impaired (impacted by cervical  hardware)    Functional Assessment Tool Used: clinical judgement Functional Limitations: Swallowing Swallow Current Status ([id number]): At least 20 percent but less than  40 percent impaired, limited or restricted Swallow Goal Status [phone]): At least 20 percent but less than 40  percent impaired, limited or restricted Swallow Discharge Status [phone]): At least 20 percent but less  than 40 percent impaired, limited or restricted     Germain Osgood, M.A. CCC-SLP [phone]  Germain Osgood 11/17/2013, 12:14 PM      Microbiology: Recent Results (from the past 240 hour(s))  URINE CULTURE     Status: None   Collection Time    12/12/13 12:27 AM      Result Value Ref Range Status   Specimen Description URINE, CLEAN CATCH   Final   Special Requests NONE   Final   Culture  Setup Time     Final   Value: 12/12/2013 10:22     Performed at [facility] Count     Final   Value: 55,000 COLONIES/ML     Performed at [facility]   Culture     Final   Value: Multiple bacterial morphotypes present, none predominant. Suggest appropriate recollection if clinically indicated.     Performed at [facility]   Report Status 12/13/2013 FINAL   Final  MRSA PCR SCREENING     Status: Abnormal   Collection Time    12/12/13 11:15 AM      Result Value Ref Range Status   MRSA by PCR POSITIVE (*) NEGATIVE Final   Comment:            The GeneXpert MRSA Assay (FDA     approved for NASAL specimens     only), is one  component of a     comprehensive MRSA colonization     surveillance program. It is not     intended to diagnose MRSA     infection nor to guide or     monitor treatment for     MRSA infections.     RESULT CALLED TO, READ BACK BY AND VERIFIED WITH:     L. ANDERSON RN AT 1250 ON 09.14.15 BY SHUEA  CULTURE, BLOOD (ROUTINE X 2)     Status: None   Collection Time    12/13/13  9:55 AM      Result Value Ref Range Status   Specimen Description BLOOD RIGHT ARM   Final   Special Requests BOTTLES DRAWN AEROBIC AND ANAEROBIC 5CC   Final   Culture  Setup Time     Final   Value: 12/13/2013 12:11     Performed at [facility]   Culture     Final   Value:        BLOOD CULTURE RECEIVED NO GROWTH TO DATE CULTURE WILL BE HELD FOR 5 DAYS BEFORE ISSUING A FINAL NEGATIVE REPORT     Performed at [facility]   Report Status PENDING   Incomplete  CULTURE, BLOOD (ROUTINE X 2)     Status: None   Collection Time    12/13/13 10:05 AM      Result Value Ref Range Status   Specimen Description BLOOD RIGHT HAND   Final   Special Requests     Final   Value: BOTTLES DRAWN AEROBIC AND ANAEROBIC 2CC  ANA 3CC AER   Culture  Setup Time     Final   Value: 12/13/2013 12:09     Performed at [facility]   Culture     Final   Value:        BLOOD CULTURE RECEIVED NO GROWTH TO DATE CULTURE WILL BE HELD FOR 5 DAYS BEFORE ISSUING A FINAL NEGATIVE REPORT     Note: Culture results may be compromised due to an inadequate volume of blood received in culture bottles.     Performed at [facility]   Report Status PENDING   Incomplete     Labs: Basic Metabolic Panel:  Recent Labs Lab 12/12/13 0028 12/12/13 1120 12/13/13 0435 12/15/13 0525 12/16/13 0420  NA 139  --  139 139 142  K 4.1  --  4.1 3.6* 3.2*  CL 100  --  105 103 106  CO2 26  --  23 18* 19  GLUCOSE 88  --  88 88 80  BUN 17  --  20 7 9   CREATININE 0.89 0.86 0.85 0.74 0.79  CALCIUM 9.6  --  8.7 9.5 9.1  MG  --  1.9  --   --    --    Liver Function Tests:  Recent Labs Lab 12/12/13 0028 12/16/13 0420  AST 14 18  ALT 6 11  ALKPHOS 98 90  BILITOT 0.5 0.5  PROT 6.9 5.8*  ALBUMIN 3.1* 2.6*    Recent Labs Lab 12/12/13 0028  LIPASE 23    Recent Labs Lab 12/13/13 0955  AMMONIA 34   CBC:  Recent Labs Lab 12/12/13 0028 12/12/13 1120 12/13/13 0435 12/15/13 0525  WBC 8.5 6.4 5.6 5.6  NEUTROABS 5.8  --   --   --   HGB 12.6* 12.0* 10.8* 11.6*  HCT 38.1* 36.0* 31.8* 34.2*  MCV 90.1 90.7 89.8 88.4  PLT 135* 111* 120* 122*   Cardiac Enzymes:  Recent Labs Lab 12/12/13 0028  TROPONINI <0.30   BNP: No components found with this basename: POCBNP,  CBG: No results found for this basename: GLUCAP,  in the last 168 hours  Time coordinating discharge:  Greater than 30 minutes  Signed:  Ketan Renz, DO [facility] Pager: [phone] 12/16/2013, 7:00 PM

## 2013-12-19 LAB — CULTURE, BLOOD (ROUTINE X 2)
CULTURE: NO GROWTH
CULTURE: NO GROWTH

## 2013-12-19 NOTE — Progress Notes (Signed)
Pt's daughter, [name], called concerned about pt's medications at discharge. It was explained to her that the medication list that is printed in the discharge paperwork is the list that the Hospital MD wants the pt to go by. The daughter thought that the hospital would send a medication list to the pharmacy. It was explained to the daughter that the hospital does not typically send a mediation list to the pharmacy, only new prescriptions are sent electronically. Pt's daughter asked if charge RN would fax the med list to the pharmacy as she did not have the time to do it. Charge RN successfully faxed over the pt's discharge medication list to [facility]. Charge RN spoke with Sam at [facility] and instructed him that the medication list was being faxed over and that the pt should only be taking the medications on that list. He was also told that if there were any concerns about medications that the pt had been taking that were not on the list that the pt's primary care MD needs to be contacted. Sam stated that he understood.   Othella Boyer [facility] 12/19/2013 5:25 PM

## 2013-12-29 ENCOUNTER — Telehealth: Payer: Self-pay | Admitting: Cardiology

## 2013-12-29 NOTE — Telephone Encounter (Signed)
ROI faxed to [facility] @ [phone]

## 2013-12-30 ENCOUNTER — Ambulatory Visit (INDEPENDENT_AMBULATORY_CARE_PROVIDER_SITE_OTHER): Payer: Medicare Other | Admitting: *Deleted

## 2013-12-30 ENCOUNTER — Encounter: Payer: Self-pay | Admitting: *Deleted

## 2013-12-30 ENCOUNTER — Encounter: Payer: Self-pay | Admitting: Cardiology

## 2013-12-30 ENCOUNTER — Encounter: Payer: Self-pay | Admitting: Internal Medicine

## 2013-12-30 ENCOUNTER — Ambulatory Visit (INDEPENDENT_AMBULATORY_CARE_PROVIDER_SITE_OTHER): Payer: Medicare Other | Admitting: Cardiology

## 2013-12-30 VITALS — BP 100/70 | HR 62 | Ht 69.0 in | Wt 135.0 lb

## 2013-12-30 DIAGNOSIS — I10 Essential (primary) hypertension: Secondary | ICD-10-CM

## 2013-12-30 DIAGNOSIS — I480 Paroxysmal atrial fibrillation: Secondary | ICD-10-CM

## 2013-12-30 DIAGNOSIS — I5032 Chronic diastolic (congestive) heart failure: Secondary | ICD-10-CM

## 2013-12-30 DIAGNOSIS — I495 Sick sinus syndrome: Secondary | ICD-10-CM

## 2013-12-30 DIAGNOSIS — F0390 Unspecified dementia without behavioral disturbance: Secondary | ICD-10-CM

## 2013-12-30 DIAGNOSIS — R0609 Other forms of dyspnea: Secondary | ICD-10-CM

## 2013-12-30 NOTE — Patient Instructions (Signed)
Your physician has requested that you have an echocardiogram. Echocardiography is a painless test that uses sound waves to create images of your heart. It provides your doctor with information about the size and shape of your heart and how well your heart's chambers and valves are working. This procedure takes approximately one hour. There are no restrictions for this procedure.  Your physician wants you to follow-up in: 4 months with Dr Aundra Dubin. (February 2016).  You will receive a reminder letter in the mail two months in advance. If you don't receive a letter, please call our office to schedule the follow-up appointment.

## 2014-01-01 DIAGNOSIS — R0609 Other forms of dyspnea: Secondary | ICD-10-CM

## 2014-01-01 DIAGNOSIS — I4891 Unspecified atrial fibrillation: Secondary | ICD-10-CM | POA: Insufficient documentation

## 2014-01-01 DIAGNOSIS — I495 Sick sinus syndrome: Secondary | ICD-10-CM | POA: Insufficient documentation

## 2014-01-01 NOTE — Progress Notes (Signed)
Patient ID: Jerry Kerr, male   DOB: [birth year], 80 y.o.   MRN: [id number] PCP: Dr. Ardeth Perfect  80 yo with history of paroxysmal atrial fibrillation and tachy-brady syndrome with [place] pacemaker presents to establish cardiology care.  He has been followed by a cardiologist in [facility] in the past.  He has developed moderate dementia and moved to [place] to live in a nursing facility near his daughter.  He was admitted in 9/15 to the ER with altered mental status.  CT head was negative, no definite cause was found.  He was on coumadin in the past for atrial fibrillation but now is off it due to frequent falls.  No stroke history.  He is in NSR today.    He uses a walker.  He is short of breath walking down the hall at his nursing home.  No chest pain.  No orthopnea or PND.  BP has been running lower recently and amlodipine and lisinopril were stopped.  He remains on metoprolol.  He is not lightheaded with standing.  He has chronic low back pain.   ECG: NSR, normal.  Labs (9/15): K 3.2, creatinine 0.79, LFTs normal  PMH: 1. Alzheimers dementia: Moderate.  2. 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  H/o esophageal stricture.   3. COPD 4. HTN 5. Tachy-brady syndrome: Chiropractor.  6. Diverticulosis 7. Depression 8. Prostate cancer s/p brachytherapy 9. H/o vertebral compression fracture 10. Atrial fibrillation: Paroxysmal.  Diagnosed initially in 2010 and started on coumadin.  Anticoagulation later stopped due to fall risk.  - Lexiscan Cardiolite (11/10) with no ischemia.  - Echo (11/10) with EF 60-65%, trace MR.  11. Carotid dopplers (8/15) with 1-39% bilateral ICA stenosis.   SH: Lives at [place] [facility].  Daughter accompanies him.  Prior heavy smoker, prior ETOH abuse.   FH: No premature CAD  ROS: All systems reviewed and negative except as per HPI.   Current Outpatient Prescriptions  Medication Sig Dispense  Refill  . acetaminophen (TYLENOL) 500 MG tablet Take 500 mg by mouth every 8 (eight) hours as needed for moderate pain.       Marland Kitchen albuterol (PROAIR HFA) 108 (90 BASE) MCG/ACT inhaler Inhale 2 puffs into the lungs 4 (four) times daily as needed for wheezing or shortness of breath.       Marland Kitchen aspirin EC 81 MG tablet Take 81 mg by mouth daily.      . divalproex (DEPAKOTE SPRINKLE) 125 MG capsule Take 500 mg by mouth 2 (two) times daily.      . Lactobacillus Rhamnosus, GG, (CULTURELLE KIDS) PACK Take 1 packet by mouth daily.      Marland Kitchen LORazepam (ATIVAN) 0.5 MG tablet Take 0.5 mg by mouth 2 (two) times daily.       . magnesium hydroxide (MILK OF MAGNESIA) 400 MG/5ML suspension Take 30 mLs by mouth daily as needed for mild constipation.      . memantine (NAMENDA) 10 MG tablet Take 10 mg by mouth 2 (two) times daily.      . metoprolol (LOPRESSOR) 50 MG tablet Take 50 mg by mouth 2 (two) times daily.      Marland Kitchen omeprazole (PRILOSEC) 40 MG capsule Take 1 capsule (40 mg total) by mouth 2 (two) times daily.  60 capsule  3  . potassium chloride 20 MEQ/15ML (10%) solution Take 20 mEq by mouth daily.      . QUEtiapine (SEROQUEL) 100 MG tablet Take 1 tablet (100 mg total) by  mouth at bedtime.  30 tablet  0  . sertraline (ZOLOFT) 50 MG tablet Take 50 mg by mouth daily.      Marland Kitchen thiamine (VITAMIN B-1) 100 MG tablet Take 1 tablet (100 mg total) by mouth daily.  30 tablet  1  . tiotropium (SPIRIVA) 18 MCG inhalation capsule Place 18 mcg into inhaler and inhale daily.      . traMADol (ULTRAM) 50 MG tablet Take 50 mg by mouth every 6 (six) hours as needed.      . traZODone (DESYREL) 50 MG tablet Take 50 mg by mouth daily.      Marland Kitchen alum & mag hydroxide-simeth (MAALOX ADVANCED) 200-200-20 MG/5ML suspension Take 30 mLs by mouth every 6 (six) hours as needed for indigestion or heartburn.      Marland Kitchen amLODipine (NORVASC) 5 MG tablet Take 1 tablet (5 mg total) by mouth daily.  30 tablet  1   No current facility-administered medications for  this visit.    BP 100/70  Pulse 62  Ht 5\' 9"  (1.753 m)  Wt 135 lb (61.236 kg)  BMI 19.93 kg/m2 General: NAD Neck: No JVD, no thyromegaly or thyroid nodule.  Lungs: Distant breath sounds bilaterally. CV: Nondisplaced PMI.  Heart regular S1/S2, no S3/S4, no murmur.  No peripheral edema.  No carotid bruit.  Normal pedal pulses.  Abdomen: Soft, nontender, no hepatosplenomegaly, no distention.  Skin: Intact without lesions or rashes.  Neurologic: Alert and oriented x 3.  Psych: Normal affect. Extremities: No clubbing or cyanosis.  HEENT: Normal.   Assessment/Plan: 1. Atrial fibrillation: Paroxysmal.  He is in NSR today.  He was taken off coumadin in the past because of fall risk.  He remains unstable per his daughter and has had falls recently.  I think that the safest course is likely going to be to keep him off anticoagulation.  He takes ASA 81 mg daily.  He will also continue metoprolol 25 mg bid.   2. Tachy-brady syndrome with [employer] pacemaker: I will arrange to have the pacemaker interrogated.  3. Exertional dyspnea: He is not volume overloaded on exam.  I suspect COPD plays a large role in this.  I will get an echocardiogram.  4. HTN: BP now tends to run on the low side.  Agree with keeping him off amlodipine and lisinopril. 5. Dementia: Moderate.  80 Daughter gives much of history.   Loralie Champagne 01/01/2014

## 2014-01-02 LAB — MDC_IDC_ENUM_SESS_TYPE_INCLINIC
Brady Statistic RA Percent Paced: 58 %
Brady Statistic RV Percent Paced: 1 %
Date Time Interrogation Session: 20151002040000
Lead Channel Impedance Value: 470 Ohm
Lead Channel Pacing Threshold Amplitude: 0.5 V
Lead Channel Pacing Threshold Amplitude: 0.8 V
Lead Channel Pacing Threshold Pulse Width: 0.5 ms
Lead Channel Sensing Intrinsic Amplitude: 1.6 mV
Lead Channel Sensing Intrinsic Amplitude: 12 mV
Lead Channel Setting Pacing Amplitude: 1.5 V
Lead Channel Setting Pacing Pulse Width: 0.4 ms
Lead Channel Setting Sensing Sensitivity: 2.5 mV
MDC IDC MSMT LEADCHNL RV IMPEDANCE VALUE: 540 Ohm
MDC IDC MSMT LEADCHNL RV PACING THRESHOLD PULSEWIDTH: 0.4 ms
MDC IDC PG SERIAL: 153770
MDC IDC SET LEADCHNL RA PACING AMPLITUDE: 2 V

## 2014-01-02 NOTE — Addendum Note (Signed)
Addended by: Katrine Coho on: 01/02/2014 08:35 AM   Modules accepted: Orders

## 2014-01-02 NOTE — Progress Notes (Signed)
Pacemaker check in clinic (new to area). Normal device function. Thresholds, sensing, impedances consistent with previous measurements. Device programmed to maximize longevity. 1 mode switch x 6 sec (0%)---h/o true AF in 2014. No high ventricular rates noted. Device programmed at appropriate safety margins. Histogram distribution appropriate for patient activity level. Device programmed to optimize intrinsic conduction. Estimated longevity >5 years. Patient will follow up with SK in 3 months to get established.

## 2014-01-03 ENCOUNTER — Encounter: Payer: Self-pay | Admitting: Cardiology

## 2014-01-04 ENCOUNTER — Ambulatory Visit: Payer: Medicare Other

## 2014-01-05 ENCOUNTER — Ambulatory Visit: Payer: Medicare Other | Attending: Internal Medicine

## 2014-01-05 DIAGNOSIS — F419 Anxiety disorder, unspecified: Secondary | ICD-10-CM | POA: Insufficient documentation

## 2014-01-05 DIAGNOSIS — I10 Essential (primary) hypertension: Secondary | ICD-10-CM | POA: Insufficient documentation

## 2014-01-05 DIAGNOSIS — Z9181 History of falling: Secondary | ICD-10-CM | POA: Diagnosis not present

## 2014-01-05 DIAGNOSIS — Z5189 Encounter for other specified aftercare: Secondary | ICD-10-CM | POA: Diagnosis not present

## 2014-01-05 DIAGNOSIS — Z950 Presence of cardiac pacemaker: Secondary | ICD-10-CM | POA: Insufficient documentation

## 2014-01-05 DIAGNOSIS — Z85828 Personal history of other malignant neoplasm of skin: Secondary | ICD-10-CM | POA: Diagnosis not present

## 2014-01-05 DIAGNOSIS — R269 Unspecified abnormalities of gait and mobility: Secondary | ICD-10-CM | POA: Diagnosis not present

## 2014-01-05 DIAGNOSIS — M545 Low back pain: Secondary | ICD-10-CM | POA: Insufficient documentation

## 2014-01-05 DIAGNOSIS — F0390 Unspecified dementia without behavioral disturbance: Secondary | ICD-10-CM | POA: Insufficient documentation

## 2014-01-05 DIAGNOSIS — J449 Chronic obstructive pulmonary disease, unspecified: Secondary | ICD-10-CM | POA: Diagnosis not present

## 2014-01-06 ENCOUNTER — Encounter: Payer: Self-pay | Admitting: Cardiology

## 2014-01-11 ENCOUNTER — Ambulatory Visit: Payer: Medicare Other

## 2014-01-11 DIAGNOSIS — Z5189 Encounter for other specified aftercare: Secondary | ICD-10-CM | POA: Diagnosis not present

## 2014-01-17 ENCOUNTER — Ambulatory Visit: Payer: Medicare Other

## 2014-01-17 DIAGNOSIS — Z5189 Encounter for other specified aftercare: Secondary | ICD-10-CM | POA: Diagnosis not present

## 2014-01-18 ENCOUNTER — Ambulatory Visit (HOSPITAL_COMMUNITY): Payer: Medicare Other | Attending: Cardiology | Admitting: Cardiology

## 2014-01-18 DIAGNOSIS — I5032 Chronic diastolic (congestive) heart failure: Secondary | ICD-10-CM

## 2014-01-18 DIAGNOSIS — I10 Essential (primary) hypertension: Secondary | ICD-10-CM | POA: Insufficient documentation

## 2014-01-18 NOTE — Progress Notes (Signed)
Echo performed. 

## 2014-01-19 ENCOUNTER — Ambulatory Visit: Payer: Medicare Other

## 2014-01-19 DIAGNOSIS — Z5189 Encounter for other specified aftercare: Secondary | ICD-10-CM | POA: Diagnosis not present

## 2014-01-23 ENCOUNTER — Ambulatory Visit: Payer: Medicare Other

## 2014-01-23 DIAGNOSIS — Z5189 Encounter for other specified aftercare: Secondary | ICD-10-CM | POA: Diagnosis not present

## 2014-01-26 ENCOUNTER — Ambulatory Visit: Payer: Medicare Other

## 2014-01-26 DIAGNOSIS — Z5189 Encounter for other specified aftercare: Secondary | ICD-10-CM | POA: Diagnosis not present

## 2014-02-13 ENCOUNTER — Ambulatory Visit: Payer: Medicare Other | Attending: Internal Medicine

## 2014-02-13 DIAGNOSIS — M545 Low back pain, unspecified: Secondary | ICD-10-CM

## 2014-02-13 DIAGNOSIS — R2689 Other abnormalities of gait and mobility: Secondary | ICD-10-CM

## 2014-02-13 DIAGNOSIS — F419 Anxiety disorder, unspecified: Secondary | ICD-10-CM | POA: Insufficient documentation

## 2014-02-13 DIAGNOSIS — Z5189 Encounter for other specified aftercare: Secondary | ICD-10-CM | POA: Insufficient documentation

## 2014-02-13 DIAGNOSIS — Z9181 History of falling: Secondary | ICD-10-CM | POA: Insufficient documentation

## 2014-02-13 DIAGNOSIS — Z85828 Personal history of other malignant neoplasm of skin: Secondary | ICD-10-CM | POA: Insufficient documentation

## 2014-02-13 DIAGNOSIS — R29898 Other symptoms and signs involving the musculoskeletal system: Secondary | ICD-10-CM

## 2014-02-13 DIAGNOSIS — I10 Essential (primary) hypertension: Secondary | ICD-10-CM | POA: Insufficient documentation

## 2014-02-13 DIAGNOSIS — F0390 Unspecified dementia without behavioral disturbance: Secondary | ICD-10-CM | POA: Diagnosis not present

## 2014-02-13 DIAGNOSIS — Z950 Presence of cardiac pacemaker: Secondary | ICD-10-CM | POA: Insufficient documentation

## 2014-02-13 DIAGNOSIS — R262 Difficulty in walking, not elsewhere classified: Secondary | ICD-10-CM

## 2014-02-13 DIAGNOSIS — R269 Unspecified abnormalities of gait and mobility: Secondary | ICD-10-CM | POA: Diagnosis not present

## 2014-02-13 DIAGNOSIS — J449 Chronic obstructive pulmonary disease, unspecified: Secondary | ICD-10-CM | POA: Diagnosis not present

## 2014-02-13 NOTE — Therapy (Signed)
Physical Therapy Treatment  Patient Details  Name: Jerry Kerr MRN: [id number] Date of Birth: [birth year]  Encounter Date: 02/13/2014      PT End of Session - 02/13/14 1223    Visit Number 7   Number of Visits 8   PT Start Time 0459   PT Stop Time 1230   PT Time Calculation (min) 45 min   Activity Tolerance Patient tolerated treatment well   Behavior During Therapy [facility] for tasks assessed/performed      Past Medical History  Diagnosis Date  . Asthma   . Dementia   . Back pain   . COPD (chronic obstructive pulmonary disease)   . Hypertension   . Pacemaker   . Anxiety   . Depression   . Cancer     SKIN, PROSTATE  . GERD (gastroesophageal reflux disease)   . Substance abuse     ETOH  . Dysphagia, unspecified(787.20) 12/12/2013    Past Surgical History  Procedure Laterality Date  . Neck surgery    . Upper gastrointestinal endoscopy    . Colonoscopy    . Ankle surgery    . Skin graft    . Pacemaker insertion    . Tonsillectomy      There were no vitals taken for this visit.  Visit Diagnosis:  Bilateral low back pain without sciatica  Difficulty in walking  Weakness of both legs  Balance problem      Subjective Assessment - 02/13/14 1155    Symptoms Feel the same   Pertinent History Redent falls with 2 recent fractures. Onset of back pain 10/2013   Limitations House hold activities;Walking   How long can you sit comfortably? As needed   How long can you stand comfortably? 5 min or less   How long can you walk comfortably? 150 feet   Patient Stated Goals Less pain   Currently in Pain? Yes   Pain Score --  none sitting      Pain Location Back   Pain Descriptors / Indicators Cramping   Pain Type Chronic pain   Pain Onset More than a month ago   Pain Frequency Intermittent   Aggravating Factors  Being on feet   Pain Relieving Factors sitting and rest.    Multiple Pain Sites No            [facility] Adult PT Treatment/Exercise - 02/13/14 1207     Exercises   Exercises --  Ankle dorsiflexion standing x15 with UE support   Knee/Hip Exercises: Standing   Heel Raises 1 set;15 reps   Hip ADduction AROM;Both;1 set;15 reps   SLS Extension   Knee/Hip Exercises: Seated   Long Arc Quad Right;Left;1 set;15 reps   Shoulder Exercises: Seated   Extension --  with bridge x15   Row AROM;Both;15 reps  red band   Other Seated Exercises Both elbow flexion red band x12          PT Education - 02/13/14 1223    Education provided No            PT Long Term Goals - 02/13/14 1227    PT LONG TERM GOAL #5   Baseline (p) 16.6 seconds today   Status (p) Achieved          Plan - 02/13/14 1224    Clinical Impression Statement He was able to do all exercise without increased pain   Pt will benefit from skilled therapeutic intervention in order to improve on the following deficits Difficulty  walking;Decreased strength   Rehab Potential Good   PT Frequency 1x / week   PT Duration --  one weeks   PT Treatment/Interventions Therapeutic exercise   PT Next Visit Plan Finish HEP   PT Home Exercise Plan Add standing exercise with supervision   Consulted and Agree with Plan of Care Patient        Problem List Patient Active Problem List   Diagnosis Date Noted  . Atrial fibrillation 01/01/2014  . Tachy-brady syndrome 01/01/2014  . Exertional dyspnea 01/01/2014  . Acute encephalopathy 12/12/2013  . Dehydration 12/12/2013  . Fall 12/12/2013  . Dysphagia, unspecified(787.20) 12/12/2013  . Compression fracture of L2 12/12/2013  . Near syncope 11/07/2013  . Dementia 11/07/2013  . Esophageal dysphagia 06/20/2013  . Esophageal ring 06/20/2013  . GERD (gastroesophageal reflux disease) 06/20/2013  . History of adenomatous polyp of colon 06/20/2013  . Multiple rib fractures 03/24/2012  . Fall down steps 03/24/2012  . Altered mental status 03/24/2012  . COPD (chronic obstructive pulmonary disease) 03/24/2012  . Hypertension 03/24/2012   . Spinal stenosis of lumbar region with radiculopathy 03/24/2012                                              Darrel Hoover  PT 02/13/2014, 3:36 PM

## 2014-02-20 ENCOUNTER — Ambulatory Visit: Payer: Medicare Other

## 2014-02-20 ENCOUNTER — Ambulatory Visit: Payer: Medicare Other | Admitting: Physical Therapy

## 2014-02-20 DIAGNOSIS — R262 Difficulty in walking, not elsewhere classified: Secondary | ICD-10-CM

## 2014-02-20 DIAGNOSIS — Z5189 Encounter for other specified aftercare: Secondary | ICD-10-CM | POA: Diagnosis not present

## 2014-02-20 DIAGNOSIS — R29898 Other symptoms and signs involving the musculoskeletal system: Secondary | ICD-10-CM

## 2014-02-20 DIAGNOSIS — R2689 Other abnormalities of gait and mobility: Secondary | ICD-10-CM

## 2014-02-20 NOTE — Therapy (Addendum)
Physical Therapy Treatment  Patient Details  Name: Jerry Kerr MRN: [id number] Date of Birth: 11/11/1933  Encounter Date: 02/20/2014      PT End of Session - 02/20/14 1703    Visit Number 8   Number of Visits 8   Date for PT Re-Evaluation 03/04/14   PT Start Time 4403   PT Stop Time 1420   PT Time Calculation (min) 45 min   Activity Tolerance Patient limited by pain   Behavior During Therapy [facility] for tasks assessed/performed      Past Medical History  Diagnosis Date  . Asthma   . Dementia   . Back pain   . COPD (chronic obstructive pulmonary disease)   . Hypertension   . Pacemaker   . Anxiety   . Depression   . Cancer     SKIN, PROSTATE  . GERD (gastroesophageal reflux disease)   . Substance abuse     ETOH  . Dysphagia, unspecified(787.20) 12/12/2013    Past Surgical History  Procedure Laterality Date  . Neck surgery    . Upper gastrointestinal endoscopy    . Colonoscopy    . Ankle surgery    . Skin graft    . Pacemaker insertion    . Tonsillectomy      There were no vitals taken for this visit.  Visit Diagnosis:  Difficulty in walking  Weakness of both legs  Balance problem      Subjective Assessment - 02/20/14 1339    Symptoms Yes it is hurting today.  It has been hurting since the last session.  I can't remember what exercise made it hurt.   Currently in Pain? Yes   Pain Score 5    Pain Location Back   Pain Orientation Lower   Pain Type Chronic pain   Pain Onset More than a month ago   Pain Frequency Intermittent   Aggravating Factors  standing and walking   Pain Relieving Factors Able to get comfortable with sitting., does not wake him up at night.   Multiple Pain Sites No            [facility] Adult PT Treatment/Exercise - 02/20/14 1346    Transfers   Transfers Sit to Stand  take extra time   High Level Balance   High Level Balance Activities Side stepping  6 feet painful without arms on counter   Knee/Hip Exercises: Machines for  Strengthening   Cybex Knee Extension Nustep level 5, 8 minutes   Knee/Hip Exercises: Standing   Heel Raises 15 reps   Functional Squat 10 reps  instruction needed   Knee/Hip Exercises: Seated   Long Arc Quad Both  4 Lbs. 20 each   Other Seated Knee Exercises ball squeeze 10 reps, 5  second hold, also isometric hip abduction 10 reps, 5 second holds.   Other Seated Knee Exercises band hamstring, green                Plan - 02/20/14 1704    Clinical Impression Statement Pain in back limited balance exercises for activities without the use of hands. Rt foot sometimes draggs floor with gait noted 1 time during session.  Working toward [employer] with strengthening.   PT Next Visit Plan Patient will call to schedule one more appointment , patient thinks sessions are helping. Plan of care is through 03/04/14.        Problem List Patient Active Problem List   Diagnosis Date Noted  . Atrial fibrillation 01/01/2014  .  Tachy-brady syndrome 01/01/2014  . Exertional dyspnea 01/01/2014  . Acute encephalopathy 12/12/2013  . Dehydration 12/12/2013  . Fall 12/12/2013  . Dysphagia, unspecified(787.20) 12/12/2013  . Compression fracture of L2 12/12/2013  . Near syncope 11/07/2013  . Dementia 11/07/2013  . Esophageal dysphagia 06/20/2013  . Esophageal ring 06/20/2013  . GERD (gastroesophageal reflux disease) 06/20/2013  . History of adenomatous polyp of colon 06/20/2013  . Multiple rib fractures 03/24/2012  . Fall down steps 03/24/2012  . Altered mental status 03/24/2012  . COPD (chronic obstructive pulmonary disease) 03/24/2012  . Hypertension 03/24/2012  . Spinal stenosis of lumbar region with radiculopathy 03/24/2012                                              HARRIS,KAREN PTA 02/20/2014, 5:13 PM    PHYSICAL THERAPY DISCHARGE SUMMARY  Visits from Start of Care: 8  Current functional level related to goals / functional  outcomes: Unknown   Remaining deficits: Unknown   Education / Equipment: HEP Plan:                                                    Patient goals were not met. Patient is being discharged due to not returning since the last visit.  ?????    Darrel Hoover, PT     02/13/15                  2:01 PM

## 2014-03-10 ENCOUNTER — Other Ambulatory Visit: Payer: Self-pay | Admitting: Internal Medicine

## 2014-04-11 ENCOUNTER — Other Ambulatory Visit (HOSPITAL_COMMUNITY): Payer: Self-pay | Admitting: Neurosurgery

## 2014-04-26 ENCOUNTER — Other Ambulatory Visit (HOSPITAL_COMMUNITY): Payer: Self-pay | Admitting: *Deleted

## 2014-04-26 NOTE — Pre-Procedure Instructions (Addendum)
Nicolas Banh  04/26/2014   Your procedure is scheduled on:  Friday, May 05, 2014 at 11:15 AM.   Report to [facility] Entrance "A" Admitting Office at 9:15 AM.   Call this number if you have problems the morning of surgery: [phone]               Any questions prior to day of surgery, please call [phone] between 8 & 4 PM.    Remember:   Do not eat food or drink liquids after midnight Thursday, 05/04/14.   Take these medicines the morning of surgery with A SIP OF WATER:   TYLENOL, ALBUTEROL INHALER, AMLODIPINE(NORVASC), DIVALPROEX(DEPAKOTE), LORAZEPAM / ATIVAN, NAMENDA, METOPROLOL (LOPRESSOR), OMEPRAZOLE (PRILOSEC), ZOLOFT(SERTRALINE), SPIRIVA, TRAMADOL IF NEEDED               Stop Aspirin and Vitamins as of Friday, 04/28/14.   Do not wear jewelry.  Do not wear lotions, powders, or cologne. You may wear deodorant.  Men may shave face and neck.  Do not bring valuables to the hospital.  [facility] is not responsible                  for any belongings or valuables.               Contacts, dentures or bridgework may not be worn into surgery.  Leave suitcase in the car. After surgery it may be brought to your room.  For patients admitted to the hospital, discharge time is determined by your                treatment team.                                                   Special Instructions: [place] - Preparing for Surgery  Before surgery, you can play an important role.  Because skin is not sterile, your skin needs to be as free of germs as possible.  You can reduce the number of germs on you skin by washing with CHG (chlorahexidine gluconate) soap before surgery.  CHG is an antiseptic cleaner which kills germs and bonds with the skin to continue killing germs even after washing.  Please DO NOT use if you have an allergy to CHG or antibacterial soaps.  If your skin becomes reddened/irritated stop using the CHG and inform your nurse when you arrive at Short  Stay.  Do not shave (including legs and underarms) for at least 48 hours prior to the first CHG shower.  You may shave your face.  Please follow these instructions carefully:   1.  Shower with CHG Soap the night before surgery and the                                morning of Surgery.  2.  If you choose to wash your hair, wash your hair first as usual with your       normal shampoo.  3.  After you shampoo, rinse your hair and body thoroughly to remove the                      Shampoo.  4.  Use CHG as you would any other liquid soap.  You can apply chg directly  to the skin and wash gently with scrungie or a clean washcloth.  5.  Apply the CHG Soap to your body ONLY FROM THE NECK DOWN.        Do not use on open wounds or open sores.  Avoid contact with your eyes, ears, mouth and genitals (private parts).  Wash genitals (private parts) with your normal soap.  6.  Wash thoroughly, paying special attention to the area where your surgery        will be performed.  7.  Thoroughly rinse your body with warm water from the neck down.  8.  DO NOT shower/wash with your normal soap after using and rinsing off       the CHG Soap.  9.  Pat yourself dry with a clean towel.            10.  Wear clean pajamas.            11.  Place clean sheets on your bed the night of your first shower and do not        sleep with pets.  Day of Surgery  Do not apply any lotions the morning of surgery.  Please wear clean clothes to the hospital.     Please read over the following fact sheets that you were given: Pain Booklet, Coughing and Deep Breathing, MRSA Information and Surgical Site Infection Prevention

## 2014-04-27 ENCOUNTER — Encounter (HOSPITAL_COMMUNITY): Payer: Self-pay

## 2014-04-27 ENCOUNTER — Encounter (HOSPITAL_COMMUNITY)
Admission: RE | Admit: 2014-04-27 | Discharge: 2014-04-27 | Disposition: A | Discharge status: Home / Self Care | Payer: Medicare Other | Source: Ambulatory Visit | Attending: Neurosurgery | Admitting: Neurosurgery

## 2014-04-27 DIAGNOSIS — Z87891 Personal history of nicotine dependence: Secondary | ICD-10-CM | POA: Diagnosis not present

## 2014-04-27 DIAGNOSIS — I5032 Chronic diastolic (congestive) heart failure: Secondary | ICD-10-CM | POA: Diagnosis not present

## 2014-04-27 DIAGNOSIS — I10 Essential (primary) hypertension: Secondary | ICD-10-CM | POA: Diagnosis not present

## 2014-04-27 DIAGNOSIS — Z01812 Encounter for preprocedural laboratory examination: Secondary | ICD-10-CM | POA: Diagnosis present

## 2014-04-27 HISTORY — DX: Cardiac arrhythmia, unspecified: I49.9

## 2014-04-27 HISTORY — DX: Barrett's esophagus without dysplasia: K22.70

## 2014-04-27 HISTORY — DX: Heart failure, unspecified: I50.9

## 2014-04-27 HISTORY — DX: Dysphagia, unspecified: R13.10

## 2014-04-27 LAB — COMPREHENSIVE METABOLIC PANEL
ALT: 8 U/L (ref 0–53)
ANION GAP: 4 — AB (ref 5–15)
AST: 19 U/L (ref 0–37)
Albumin: 3 g/dL — ABNORMAL LOW (ref 3.5–5.2)
Alkaline Phosphatase: 66 U/L (ref 39–117)
BUN: 10 mg/dL (ref 6–23)
CALCIUM: 8.2 mg/dL — AB (ref 8.4–10.5)
CO2: 30 mmol/L (ref 19–32)
Chloride: 106 mmol/L (ref 96–112)
Creatinine, Ser: 0.87 mg/dL (ref 0.50–1.35)
GFR calc non Af Amer: 81 mL/min — ABNORMAL LOW (ref >90)
Glucose, Bld: 85 mg/dL (ref 70–99)
POTASSIUM: 4.9 mmol/L (ref 3.5–5.1)
Sodium: 140 mmol/L (ref 135–145)
TOTAL PROTEIN: 5.8 g/dL — AB (ref 6.0–8.3)
Total Bilirubin: 0.5 mg/dL (ref 0.3–1.2)

## 2014-04-27 LAB — CBC
HCT: 37.8 % — ABNORMAL LOW (ref 39.0–52.0)
HEMOGLOBIN: 12.5 g/dL — AB (ref 13.0–17.0)
MCH: 30 pg (ref 26.0–34.0)
MCHC: 33.1 g/dL (ref 30.0–36.0)
MCV: 90.6 fL (ref 78.0–100.0)
Platelets: 121 10*3/uL — ABNORMAL LOW (ref 150–400)
RBC: 4.17 MIL/uL — ABNORMAL LOW (ref 4.22–5.81)
RDW: 14.4 % (ref 11.5–15.5)
WBC: 6.1 10*3/uL (ref 4.0–10.5)

## 2014-04-27 LAB — SURGICAL PCR SCREEN
MRSA, PCR: POSITIVE — AB
STAPHYLOCOCCUS AUREUS: POSITIVE — AB

## 2014-04-27 NOTE — Progress Notes (Signed)
   04/27/14 1023  OBSTRUCTIVE SLEEP APNEA  Have you ever been diagnosed with sleep apnea through a sleep study? No  Do you snore loudly (loud enough to be heard through closed doors)?  1  Do you often feel tired, fatigued, or sleepy during the daytime? 0  Has anyone observed you stop breathing during your sleep? 1  Do you have, or are you being treated for high blood pressure? 1  BMI more than 35 kg/m2? 0  Age over 79 years old? 1  Neck circumference greater than 40 cm/16 inches? 1  Gender: 1  Obstructive Sleep Apnea Score 6  Score 4 or greater  Results sent to PCP

## 2014-04-28 ENCOUNTER — Encounter (HOSPITAL_COMMUNITY): Payer: Self-pay

## 2014-04-28 NOTE — Progress Notes (Signed)
Anesthesia Chart Review:  Pt is 81 year old male scheduled for B L2 kyphoplasty on 05/05/2014 with Dr. Kathyrn Sheriff.   Cardiologist is Dr. Aundra Dubin. PCP is Dr. Ardeth Perfect.   PMH includes: Chronic diastolic HF, atrial fibrillation, tachy-brady syndrome, HTN, pacemaker ([employer]), dementia, COPD, hx Etoh abuse, Barrett's esophagus. Former smoker. BMI 20.   Medications include: ASA, depakote, namenda, seroquel, metoprolol, potassium, thiamine  Preoperative labs reviewed.    Chest x-ray 12/12/2013 reviewed. Mild elevation of the left hemidiaphragm. No acute cardiopulmonary process seen.   EKG 12/30/2013: NSR  Echo 01/18/2014: - Left ventricle: The cavity size was normal. There was mild focal basal hypertrophy of the septum. Systolic function was normal. The estimated ejection fraction was in the range of 60% to 65%. Wall motion was normal; there were no regional wall motion abnormalities. There was an increased relative contribution of atrial contraction to ventricular filling. Doppler parameters are consistent with abnormal left ventricular relaxation (grade 1 diastolic dysfunction). - Aorta: Ascending aortic diameter: 36 mm (S). - Ascending aorta: The ascending aorta was mildly dilated. - Mitral valve: Calcified annulus. There was trivial regurgitation. - Pulmonary arteries: PA peak pressure: 31 mm Hg (S). Impressions: - The right ventricular systolic pressure was increased consistent with mild pulmonary hypertension.  Nuclear stress test 02/20/2009: -Myocardial perfusion imaging is normal -Overall LV systolic function was normal without regional wall motion abnormalities. EF was 62%  Pacemaker prescription form received from EP cardiology clinic. Procedure may interfere with device function. Magnet should be placed over device during procedure. Post-op interrogation is not needed.   If no changes, I anticipate pt can proceed with surgery as scheduled.   Willeen Cass, FNP-BC [facility]/Anesthesiology Phone: [phone] 04/28/2014 2:46 PM

## 2014-05-04 ENCOUNTER — Encounter: Payer: Self-pay | Admitting: *Deleted

## 2014-05-04 MED ORDER — CEFAZOLIN SODIUM-DEXTROSE 2-3 GM-% IV SOLR
2.0000 g | INTRAVENOUS | Status: AC
  Administered 2014-05-05: 2 g via INTRAVENOUS
  Filled 2014-05-04: qty 50

## 2014-05-05 ENCOUNTER — Inpatient Hospital Stay (HOSPITAL_COMMUNITY): Payer: Medicare Other

## 2014-05-05 ENCOUNTER — Inpatient Hospital Stay (HOSPITAL_COMMUNITY): Payer: Medicare Other | Admitting: Anesthesiology

## 2014-05-05 ENCOUNTER — Encounter (HOSPITAL_COMMUNITY): Payer: Self-pay | Admitting: *Deleted

## 2014-05-05 ENCOUNTER — Encounter (HOSPITAL_COMMUNITY)
Admission: RE | Disposition: A | Discharge status: Home / Self Care | Payer: Self-pay | Source: Ambulatory Visit | Attending: Neurosurgery

## 2014-05-05 ENCOUNTER — Ambulatory Visit (HOSPITAL_COMMUNITY)
Admission: RE | Admit: 2014-05-05 | Discharge: 2014-05-05 | DRG: 516 | Disposition: A | Discharge status: Home / Self Care | Payer: Medicare Other | Source: Ambulatory Visit | Attending: Neurosurgery | Admitting: Neurosurgery

## 2014-05-05 ENCOUNTER — Inpatient Hospital Stay (HOSPITAL_COMMUNITY): Payer: Medicare Other | Admitting: Emergency Medicine

## 2014-05-05 DIAGNOSIS — I10 Essential (primary) hypertension: Secondary | ICD-10-CM | POA: Insufficient documentation

## 2014-05-05 DIAGNOSIS — M545 Low back pain: Secondary | ICD-10-CM | POA: Diagnosis present

## 2014-05-05 DIAGNOSIS — K219 Gastro-esophageal reflux disease without esophagitis: Secondary | ICD-10-CM | POA: Insufficient documentation

## 2014-05-05 DIAGNOSIS — I5032 Chronic diastolic (congestive) heart failure: Secondary | ICD-10-CM | POA: Insufficient documentation

## 2014-05-05 DIAGNOSIS — Z85828 Personal history of other malignant neoplasm of skin: Secondary | ICD-10-CM | POA: Insufficient documentation

## 2014-05-05 DIAGNOSIS — I4891 Unspecified atrial fibrillation: Secondary | ICD-10-CM | POA: Diagnosis not present

## 2014-05-05 DIAGNOSIS — F0390 Unspecified dementia without behavioral disturbance: Secondary | ICD-10-CM | POA: Diagnosis not present

## 2014-05-05 DIAGNOSIS — Z8546 Personal history of malignant neoplasm of prostate: Secondary | ICD-10-CM | POA: Insufficient documentation

## 2014-05-05 DIAGNOSIS — J45909 Unspecified asthma, uncomplicated: Secondary | ICD-10-CM | POA: Diagnosis not present

## 2014-05-05 DIAGNOSIS — Z7982 Long term (current) use of aspirin: Secondary | ICD-10-CM | POA: Diagnosis not present

## 2014-05-05 DIAGNOSIS — F329 Major depressive disorder, single episode, unspecified: Secondary | ICD-10-CM | POA: Insufficient documentation

## 2014-05-05 DIAGNOSIS — M4856XA Collapsed vertebra, not elsewhere classified, lumbar region, initial encounter for fracture: Secondary | ICD-10-CM | POA: Diagnosis not present

## 2014-05-05 DIAGNOSIS — G309 Alzheimer's disease, unspecified: Secondary | ICD-10-CM | POA: Insufficient documentation

## 2014-05-05 DIAGNOSIS — Z792 Long term (current) use of antibiotics: Secondary | ICD-10-CM | POA: Diagnosis not present

## 2014-05-05 DIAGNOSIS — Z79899 Other long term (current) drug therapy: Secondary | ICD-10-CM | POA: Diagnosis not present

## 2014-05-05 DIAGNOSIS — F0280 Dementia in other diseases classified elsewhere without behavioral disturbance: Secondary | ICD-10-CM | POA: Diagnosis not present

## 2014-05-05 DIAGNOSIS — J449 Chronic obstructive pulmonary disease, unspecified: Secondary | ICD-10-CM | POA: Diagnosis not present

## 2014-05-05 DIAGNOSIS — Z950 Presence of cardiac pacemaker: Secondary | ICD-10-CM | POA: Insufficient documentation

## 2014-05-05 DIAGNOSIS — Z87891 Personal history of nicotine dependence: Secondary | ICD-10-CM | POA: Diagnosis not present

## 2014-05-05 DIAGNOSIS — Z419 Encounter for procedure for purposes other than remedying health state, unspecified: Secondary | ICD-10-CM

## 2014-05-05 HISTORY — PX: KYPHOPLASTY: SHX5884

## 2014-05-05 SURGERY — KYPHOPLASTY
Anesthesia: General | Site: Spine Lumbar | Laterality: Bilateral

## 2014-05-05 MED ORDER — BUPIVACAINE HCL (PF) 0.5 % IJ SOLN
INTRAMUSCULAR | Status: DC | PRN
  Administered 2014-05-05: 4 mL

## 2014-05-05 MED ORDER — MUPIROCIN 2 % EX OINT
TOPICAL_OINTMENT | CUTANEOUS | Status: AC
  Filled 2014-05-05: qty 22

## 2014-05-05 MED ORDER — PROPOFOL 10 MG/ML IV BOLUS
INTRAVENOUS | Status: DC | PRN
  Administered 2014-05-05: 100 mg via INTRAVENOUS

## 2014-05-05 MED ORDER — FENTANYL CITRATE 0.05 MG/ML IJ SOLN
INTRAMUSCULAR | Status: DC | PRN
  Administered 2014-05-05: 50 ug via INTRAVENOUS

## 2014-05-05 MED ORDER — PROPOFOL 10 MG/ML IV BOLUS
INTRAVENOUS | Status: AC
  Filled 2014-05-05: qty 20

## 2014-05-05 MED ORDER — FENTANYL CITRATE 0.05 MG/ML IJ SOLN
INTRAMUSCULAR | Status: AC
  Filled 2014-05-05: qty 5

## 2014-05-05 MED ORDER — IOHEXOL 300 MG/ML  SOLN
INTRAMUSCULAR | Status: DC | PRN
  Administered 2014-05-05: 50 mL

## 2014-05-05 MED ORDER — MUPIROCIN 2 % EX OINT
TOPICAL_OINTMENT | Freq: Once | CUTANEOUS | Status: AC
  Administered 2014-05-05: 10:00:00 via NASAL

## 2014-05-05 MED ORDER — FENTANYL CITRATE 0.05 MG/ML IJ SOLN
25.0000 ug | INTRAMUSCULAR | Status: DC | PRN
Start: 2014-05-05 — End: 2014-05-05

## 2014-05-05 MED ORDER — GLYCOPYRROLATE 0.2 MG/ML IJ SOLN
INTRAMUSCULAR | Status: DC | PRN
  Administered 2014-05-05: 0.4 mg via INTRAVENOUS

## 2014-05-05 MED ORDER — PHENYLEPHRINE HCL 10 MG/ML IJ SOLN
INTRAMUSCULAR | Status: DC | PRN
  Administered 2014-05-05: 40 ug via INTRAVENOUS

## 2014-05-05 MED ORDER — ROCURONIUM BROMIDE 100 MG/10ML IV SOLN
INTRAVENOUS | Status: DC | PRN
  Administered 2014-05-05: 30 mg via INTRAVENOUS

## 2014-05-05 MED ORDER — LIDOCAINE-EPINEPHRINE 1 %-1:100000 IJ SOLN
INTRAMUSCULAR | Status: DC | PRN
Start: 2014-05-05 — End: 2014-05-05
  Administered 2014-05-05: 8 mL

## 2014-05-05 MED ORDER — ONDANSETRON HCL 4 MG/2ML IJ SOLN
INTRAMUSCULAR | Status: DC | PRN
  Administered 2014-05-05: 4 mg via INTRAVENOUS

## 2014-05-05 MED ORDER — LIDOCAINE HCL (CARDIAC) 20 MG/ML IV SOLN
INTRAVENOUS | Status: DC | PRN
  Administered 2014-05-05: 20 mg via INTRAVENOUS

## 2014-05-05 MED ORDER — LACTATED RINGERS IV SOLN
INTRAVENOUS | Status: DC
  Administered 2014-05-05: 10:00:00 via INTRAVENOUS

## 2014-05-05 MED ORDER — LACTATED RINGERS IV SOLN
INTRAVENOUS | Status: DC | PRN
  Administered 2014-05-05: 11:00:00 via INTRAVENOUS

## 2014-05-05 MED ORDER — NEOSTIGMINE METHYLSULFATE 10 MG/10ML IV SOLN
INTRAVENOUS | Status: DC | PRN
  Administered 2014-05-05: 3 mg via INTRAVENOUS

## 2014-05-05 MED ORDER — ARTIFICIAL TEARS OP OINT
TOPICAL_OINTMENT | OPHTHALMIC | Status: DC | PRN
  Administered 2014-05-05: 1 via OPHTHALMIC

## 2014-05-05 MED ORDER — TRAMADOL HCL 50 MG PO TABS: 50.0000 mg | ORAL_TABLET | Freq: Four times a day (QID) | ORAL | Status: DC | PRN

## 2014-05-05 SURGICAL SUPPLY — 42 items
BLADE CLIPPER SURG (BLADE) IMPLANT
BLADE SURG 15 STRL LF DISP TIS (BLADE) ×1 IMPLANT
BLADE SURG 15 STRL SS (BLADE) ×2
CEMENT BONE KYPHX HV R (Orthopedic Implant) ×3 IMPLANT
CEMENT KYPHON C01A KIT/MIXER (Cement) ×3 IMPLANT
CONT SPEC 4OZ CLIKSEAL STRL BL (MISCELLANEOUS) ×6 IMPLANT
DRAPE C-ARM 42X72 X-RAY (DRAPES) ×3 IMPLANT
DRAPE INCISE IOBAN 66X45 STRL (DRAPES) ×3 IMPLANT
DRAPE LAPAROTOMY 100X72X124 (DRAPES) ×3 IMPLANT
DRAPE PROXIMA HALF (DRAPES) ×3 IMPLANT
DRAPE SURG 17X23 STRL (DRAPES) ×3 IMPLANT
DRAPE WARM FLUID 44X44 (DRAPE) ×3 IMPLANT
DRSG TELFA 3X8 NADH (GAUZE/BANDAGES/DRESSINGS) IMPLANT
DURAPREP 26ML APPLICATOR (WOUND CARE) ×3 IMPLANT
GAUZE SPONGE 4X4 16PLY XRAY LF (GAUZE/BANDAGES/DRESSINGS) ×3 IMPLANT
GLOVE BIOGEL PI IND STRL 7.5 (GLOVE) ×4 IMPLANT
GLOVE BIOGEL PI INDICATOR 7.5 (GLOVE) ×8
GLOVE ECLIPSE 7.0 STRL STRAW (GLOVE) ×3 IMPLANT
GLOVE ECLIPSE 7.5 STRL STRAW (GLOVE) ×3 IMPLANT
GLOVE EXAM NITRILE LRG STRL (GLOVE) IMPLANT
GLOVE EXAM NITRILE MD LF STRL (GLOVE) IMPLANT
GLOVE EXAM NITRILE XL STR (GLOVE) IMPLANT
GLOVE EXAM NITRILE XS STR PU (GLOVE) IMPLANT
GLOVE SURG SS PI 7.0 STRL IVOR (GLOVE) ×3 IMPLANT
GOWN STRL REUS W/ TWL LRG LVL3 (GOWN DISPOSABLE) ×1 IMPLANT
GOWN STRL REUS W/ TWL XL LVL3 (GOWN DISPOSABLE) ×2 IMPLANT
GOWN STRL REUS W/TWL 2XL LVL3 (GOWN DISPOSABLE) IMPLANT
GOWN STRL REUS W/TWL LRG LVL3 (GOWN DISPOSABLE) ×2
GOWN STRL REUS W/TWL XL LVL3 (GOWN DISPOSABLE) ×4
KIT BASIN OR (CUSTOM PROCEDURE TRAY) ×3 IMPLANT
KIT ROOM TURNOVER OR (KITS) ×3 IMPLANT
LIQUID BAND (GAUZE/BANDAGES/DRESSINGS) ×3 IMPLANT
NEEDLE HYPO 25X1 1.5 SAFETY (NEEDLE) ×3 IMPLANT
NS IRRIG 1000ML POUR BTL (IV SOLUTION) ×3 IMPLANT
PACK SURGICAL SETUP 50X90 (CUSTOM PROCEDURE TRAY) ×3 IMPLANT
PAD ARMBOARD 7.5X6 YLW CONV (MISCELLANEOUS) ×15 IMPLANT
SPECIMEN JAR SMALL (MISCELLANEOUS) IMPLANT
SUT VICRYL 3-0 RB1 18 ABS (SUTURE) ×3 IMPLANT
SYR CONTROL 10ML LL (SYRINGE) ×6 IMPLANT
TOWEL OR 17X24 6PK STRL BLUE (TOWEL DISPOSABLE) ×3 IMPLANT
TOWEL OR 17X26 10 PK STRL BLUE (TOWEL DISPOSABLE) ×3 IMPLANT
TRAY KYPHOPAK 20/3 ONESTEP 1ST (MISCELLANEOUS) ×3 IMPLANT

## 2014-05-05 NOTE — H&P (Signed)
CC:  Back pain  HPI: Jerry Kerr is a 81 year old man seen for initial consultation in the office. His primary complaint today is midline low back pain which has been going on for several months. He says he did suffer a fall landing on his butt a few months ago. Since that time he has been having this back pain. He denies any radiation of the pain into the buttock or legs. He does not have any weakness of his legs, nor does he complain of any numbness or tingling of his legs. He does not have any bowel or bladder changes. He describes the pain as very sharp, right in the midline, and on average is 6 out of 10 in severity, occasionally somewhat worse. It has not really gotten any better over the last few months.   PMH: Past Medical History  Diagnosis Date  . Asthma   . Dementia   . Back pain   . COPD (chronic obstructive pulmonary disease)   . Hypertension   . Pacemaker   . Anxiety   . Depression   . Cancer     SKIN, PROSTATE  . GERD (gastroesophageal reflux disease)   . Substance abuse     ETOH  . Dysphagia, unspecified(787.20) 12/12/2013  . Swallowing difficulty     SEES  DR. PYRTLE HAS HX ESOPHAGEAL DIL  . Barrett's esophagus   . CHF (congestive heart failure)     chronic diastolic HF  . Dysrhythmia     atrial fibrillation    PSH: Past Surgical History  Procedure Laterality Date  . Neck surgery    . Upper gastrointestinal endoscopy    . Colonoscopy    . Ankle surgery    . Skin graft    . Pacemaker insertion    . Tonsillectomy    . Esophageal dilation      SH: History  Substance Use Topics  . 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 Smokeless tobacco: Never Used  . Alcohol Use: Yes     Comment: quit    MEDS: Prior to Admission medications   Medication Sig Start Date End Date Taking? Authorizing Provider  acetaminophen (TYLENOL) 500 MG tablet Take 500 mg by mouth every 8 (eight) hours as needed for moderate pain.    Yes Historical Provider, MD  albuterol (PROAIR HFA)  108 (90 BASE) MCG/ACT inhaler Inhale 2 puffs into the lungs 4 (four) times daily as needed for wheezing or shortness of breath.    Yes Historical Provider, MD  amoxicillin (AMOXIL) 500 MG capsule Take 500 mg by mouth 3 (three) times daily. 04/17/14  Yes Historical Provider, MD  aspirin 81 MG tablet Take 81 mg by mouth daily.   Yes Historical Provider, MD  divalproex (DEPAKOTE SPRINKLE) 125 MG capsule Take 500 mg by mouth 2 (two) times daily.   Yes Historical Provider, MD  LORazepam (ATIVAN) 0.5 MG tablet Take 0.5 mg by mouth 2 (two) times daily.    Yes Historical Provider, MD  memantine (NAMENDA) 10 MG tablet Take 10 mg by mouth 2 (two) times daily.   Yes Historical Provider, MD  metoprolol (LOPRESSOR) 50 MG tablet Take 50 mg by mouth 2 (two) times daily.   Yes Historical Provider, MD  omeprazole (PRILOSEC) 20 MG capsule TAKE TWO   CAPSULES (40 MG TOTAL) BY MOUTH TWO   TIMES DAILY 03/10/14  Yes Jerene Bears, MD  potassium chloride 20 MEQ/15ML (10%) solution Take 20 mEq by mouth daily.   Yes Historical Provider, MD  QUEtiapine (SEROQUEL) 100 MG tablet Take 1 tablet (100 mg total) by mouth at bedtime. Patient taking differently: Take 50 mg by mouth at bedtime.  12/16/13  Yes Orson Eva, MD  sertraline (ZOLOFT) 50 MG tablet Take 25 mg by mouth daily.    Yes Historical Provider, MD  thiamine (VITAMIN B-1) 100 MG tablet Take 1 tablet (100 mg total) by mouth daily. 12/16/13  Yes Orson Eva, MD  tiotropium (SPIRIVA) 18 MCG inhalation capsule Place 18 mcg into inhaler and inhale daily.   Yes Historical Provider, MD  traMADol (ULTRAM) 50 MG tablet Take 50 mg by mouth every 6 (six) hours as needed. 03/21/13  Yes Historical Provider, MD  alum & mag hydroxide-simeth (MAALOX ADVANCED) 200-200-20 MG/5ML suspension Take 30 mLs by mouth every 6 (six) hours as needed for indigestion or heartburn.    Historical Provider, MD  amLODipine (NORVASC) 5 MG tablet Take 1 tablet (5 mg total) by mouth daily. Patient not taking:  Reported on 04/27/2014 12/16/13   Orson Eva, MD  ibuprofen (ADVIL,MOTRIN) 200 MG tablet Take 200 mg by mouth every 6 (six) hours as needed (pain).    Historical Provider, MD  omeprazole (PRILOSEC) 40 MG capsule Take 1 capsule (40 mg total) by mouth 2 (two) times daily. Patient not taking: Reported on 04/27/2014 11/14/13   Jerene Bears, MD    ALLERGY: Allergies  Allergen Reactions  . Hydrocodone Other (See Comments)    Confusion. agitation    ROS: ROS  NEUROLOGIC EXAM: Awake, alert, oriented Memory and concentration grossly intact Speech fluent, appropriate CN grossly intact Motor exam: Upper Extremities Deltoid Bicep Tricep Grip  Right 5/5 5/5 5/5 5/5  Left 5/5 5/5 5/5 5/5   Lower Extremity IP Quad PF DF EHL  Right 5/5 5/5 5/5 5/5 5/5  Left 5/5 5/5 5/5 5/5 5/5   Sensation grossly intact to LT  [facility]: Plain films of the lumbar spine were reviewed from August of this year as well as from 2 days ago. These do demonstrate an old L1 compression fracture. On the more recent x-rays, there is slight compression deformity and endplate fracture of L2 resulting in approximately 1 cm height loss.  IMPRESSION: 81 year old man with persistent low back pain likely related to a L2 compression fracture   PLAN: proceed with vertebral augmentation via L2 kyphoplasty   The x-ray findings were reviewed with the patient and his daughter. I did explain to him that I believe his back pain is likely related to the new L2 compression fracture. The risks and benefits of all the treatment options were reviewed, including continued conservative management with medications and a brace, versus kyphoplasty. Specifically, the risks of kyphoplasty including non-target cement embolization into the spinal canal, or pulmonary embolus were reviewed. The patient appeared to understand our discussion. All his questions were answered.

## 2014-05-05 NOTE — Anesthesia Preprocedure Evaluation (Addendum)
Anesthesia Evaluation  Patient identified by MRN, date of birth, ID band Patient awake    Reviewed: Allergy & Precautions, NPO status , Patient's Chart, lab work & pertinent test results, reviewed documented beta blocker date and time   History of Anesthesia Complications Negative for: history of anesthetic complications  Airway Mallampati: II  TM Distance: >3 FB Neck ROM: Full    Dental  (+) Chipped, Poor Dentition, Dental Advisory Given   Pulmonary asthma , COPD COPD inhaler, former smoker,  breath sounds clear to auscultation        Cardiovascular hypertension, Pt. on medications - angina+CHF + dysrhythmias Atrial Fibrillation + pacemaker Rhythm:Regular Rate:Normal  '15 ECHO: EF 60-65%, valves OK '10 cardiolite:  with no ischemia.  '10 ECHO: EF 60-65%, trace MR.   Neuro/Psych Anxiety Depression Alzheimer's    GI/Hepatic Neg liver ROS, GERD-  Medicated and Controlled,  Endo/Other  negative endocrine ROS  Renal/GU negative Renal ROS     Musculoskeletal   Abdominal   Peds  Hematology negative hematology ROS (+)   Anesthesia Other Findings   Reproductive/Obstetrics                            Anesthesia Physical Anesthesia Plan  ASA: III  Anesthesia Plan: General   Post-op Pain Management:    Induction: Intravenous  Airway Management Planned: Oral ETT  Additional Equipment:   Intra-op Plan:   Post-operative Plan: Extubation in OR  Informed Consent: I have reviewed the patients History and Physical, chart, labs and discussed the procedure including the risks, benefits and alternatives for the proposed anesthesia with the patient or authorized representative who has indicated his/her understanding and acceptance.   Dental advisory given  Plan Discussed with: CRNA and Surgeon  Anesthesia Plan Comments: (Plan routine monitors, GETA)        Anesthesia Quick Evaluation

## 2014-05-05 NOTE — Op Note (Signed)
PREOP DIAGNOSIS: L2 compression fx   POSTOP DIAGNOSIS: Same  PROCEDURE: 1. L2 Kyphoplasty  SURGEON: Dr. Consuella Lose, MD  ASSISTANT: None  ANESTHESIA: GETA  EBL: Minimal  SPECIMENS: None  DRAINS: None  COMPLICATIONS: None immediate  CONDITION: Hemodynamically stable to PCAU  HISTORY: Jerry Kerr is a 81 y.o. yo male seen initially in the office with several months of back pain. Plain films demonstrated an L2 compression fracture, with an old, chronic L1 fracture. After conservative measures failed, he elected to proceed with kyphoplasty. The risks and benefits were discussed with the patient and his daughter. After all questions were answered, consent was obtained.  PROCEDURE IN DETAIL: After informed consent was obtained and witnessed, the patient was brought to the operating room. After induction of anesthesia, the patient was positioned on the operative table in the prone position. All pressure points were meticulously padded. Fluoroscopy was used to mark out the projection of the L2 pedicles on the skin. Skin incision was then marked out and prepped and draped in the usual sterile fashion.  After time out was conducted, bilateral stab incisions were made and Jamshidi needles were introduced. Under AP and lateral fluoroscopic guidance, bilateral L2 pedicles were canulated. Drill was then used to create a channel and the kyphoplasty balloon was placed and expanded to create a cavity. Approximately 7cc of PMMA cement was injected in each side under fluoroscopy, taking care to preserve the posterior cortex. The balloons were then removed, replaced with the trocars, and the Jamshidi needles removed.  Stab incisions were then closed with 3-0 vicryl suture and standard skin glue. The patient was then transferred to the stretcher and taken to the PACU in stable hemodynamic condition.  At the end of the case all sponge, needle, and instrument counts were correct.

## 2014-05-05 NOTE — Progress Notes (Signed)
[employer] rep paged, awaiting call back.

## 2014-05-05 NOTE — Anesthesia Postprocedure Evaluation (Signed)
  Anesthesia Post-op Note  Patient: Jerry Kerr  Procedure(s) Performed: Procedure(s): KYPHOPLASTY LUMBAR TWO (Bilateral)  Patient Location: PACU  Anesthesia Type:General  Level of Consciousness: awake, alert , patient cooperative and responds to stimulation  Airway and Oxygen Therapy: Patient Spontanous Breathing and Patient connected to nasal cannula oxygen  Post-op Pain: none  Post-op Assessment: Post-op Vital signs reviewed, Patient's Cardiovascular Status Stable, Respiratory Function Stable, Patent Airway, No signs of Nausea or vomiting and Pain level controlled  Post-op Vital Signs: Reviewed and stable  Last Vitals:  Filed Vitals:   05/05/14 1455  BP:   Pulse:   Temp: 36.5 C  Resp:     Complications: No apparent anesthesia complications

## 2014-05-05 NOTE — Transfer of Care (Signed)
Immediate Anesthesia Transfer of Care Note  Patient: Jerry Kerr  Procedure(s) Performed: Procedure(s): KYPHOPLASTY LUMBAR TWO (Bilateral)  Patient Location: PACU  Anesthesia Type:General  Level of Consciousness: awake, alert , oriented and patient cooperative  Airway & Oxygen Therapy: Patient Spontanous Breathing and Patient connected to nasal cannula oxygen  Post-op Assessment: Report given to RN and Post -op Vital signs reviewed and stable  Post vital signs: Reviewed and stable  Last Vitals:  Filed Vitals:   05/05/14 1250  BP: 156/74  Pulse: 60  Temp: 36.3 C  Resp: 12    Complications: No apparent anesthesia complications

## 2014-05-05 NOTE — Progress Notes (Signed)
Report to Maria RN.

## 2014-05-05 NOTE — Progress Notes (Signed)
[employer] notified and called back regarding patient with pacemaker

## 2014-05-08 ENCOUNTER — Encounter (HOSPITAL_COMMUNITY): Payer: Self-pay | Admitting: Neurosurgery

## 2014-05-11 ENCOUNTER — Ambulatory Visit (INDEPENDENT_AMBULATORY_CARE_PROVIDER_SITE_OTHER): Payer: Medicare Other | Admitting: *Deleted

## 2014-05-11 DIAGNOSIS — Z950 Presence of cardiac pacemaker: Secondary | ICD-10-CM | POA: Diagnosis not present

## 2014-05-11 DIAGNOSIS — I495 Sick sinus syndrome: Secondary | ICD-10-CM | POA: Diagnosis not present

## 2014-05-11 LAB — MDC_IDC_ENUM_SESS_TYPE_INCLINIC
Brady Statistic RA Percent Paced: 92 %
Implantable Pulse Generator Serial Number: 153770
Lead Channel Impedance Value: 500 Ohm
Lead Channel Pacing Threshold Amplitude: 0.7 V
Lead Channel Pacing Threshold Amplitude: 0.9 V
Lead Channel Sensing Intrinsic Amplitude: 12 mV
Lead Channel Setting Pacing Amplitude: 2 V
Lead Channel Setting Pacing Pulse Width: 0.4 ms
Lead Channel Setting Sensing Sensitivity: 2.5 mV
MDC IDC MSMT LEADCHNL RA IMPEDANCE VALUE: 430 Ohm
MDC IDC MSMT LEADCHNL RA PACING THRESHOLD PULSEWIDTH: 0.5 ms
MDC IDC MSMT LEADCHNL RA SENSING INTR AMPL: 1.2 mV
MDC IDC MSMT LEADCHNL RV PACING THRESHOLD PULSEWIDTH: 0.4 ms
MDC IDC SESS DTM: 20160211050000
MDC IDC SET LEADCHNL RV PACING AMPLITUDE: 3.5 V
MDC IDC STAT BRADY RV PERCENT PACED: 2 %

## 2014-05-11 NOTE — Progress Notes (Signed)
Pacemaker check in clinic. Normal device function. Thresholds, sensing, impedances consistent with previous measurements. Device programmed to maximize longevity. 1 ATR---6 sec. No high ventricular rates noted. Device programmed at appropriate safety margins. Histogram distribution appropriate for patient activity level. Device programmed to optimize intrinsic conduction. Estimated longevity 3.32yrs. ROV w/ Dr. Caryl Comes in 49mo.

## 2014-05-16 NOTE — Discharge Summary (Signed)
Physician Discharge Summary  Patient ID: Jerry Kerr MRN: [id number] DOB/AGE: 1933-08-25 81 y.o.  Admit date: 05/05/2014 Discharge date: 05/05/14  Admission Diagnoses: L2 compression fracture  Discharge Diagnoses: Same Active Problems:   * No active hospital problems. *   Discharged Condition: Stable  Hospital Course:  Jerry Kerr is a 81 y.o. male who presented for elective L2 kyphoplasty which was done without complication. The patient was recovered uneventfully and discharged home in baseline condition.  Treatments: Surgery - L2 kyphoplasty  Discharge Exam: Blood pressure 163/81, pulse 60, temperature 97.7 F (36.5 C), temperature source Oral, resp. rate 8, height 5\' 9"  (1.753 m), weight 62.279 kg (137 lb 4.8 oz), SpO2 93 %. Awake, alert, oriented Speech fluent, appropriate CN grossly intact 5/5 BUE/BLE Wound c/d/i  Follow-up: Follow-up in my office [facility] [phone]) in 4 weeks  Disposition: 01-Home or Self Care     Medication List    TAKE these medications        acetaminophen 500 MG tablet  Commonly known as:  TYLENOL  Take 500 mg by mouth every 8 (eight) hours as needed for moderate pain.     amLODipine 5 MG tablet  Commonly known as:  NORVASC  Take 1 tablet (5 mg total) by mouth daily.     amoxicillin 500 MG capsule  Commonly known as:  AMOXIL  Take 500 mg by mouth 3 (three) times daily.     aspirin 81 MG tablet  Take 81 mg by mouth daily.     divalproex 125 MG capsule  Commonly known as:  DEPAKOTE SPRINKLE  Take 500 mg by mouth 2 (two) times daily.     ibuprofen 200 MG tablet  Commonly known as:  ADVIL,MOTRIN  Take 200 mg by mouth every 6 (six) hours as needed (pain).     LORazepam 0.5 MG tablet  Commonly known as:  ATIVAN  Take 0.5 mg by mouth 2 (two) times daily.     MAALOX ADVANCED 200-200-20 MG/5ML suspension  Generic drug:  alum & mag hydroxide-simeth  Take 30 mLs by mouth every 6 (six) hours as  needed for indigestion or heartburn.     memantine 10 MG tablet  Commonly known as:  NAMENDA  Take 10 mg by mouth 2 (two) times daily.     metoprolol 50 MG tablet  Commonly known as:  LOPRESSOR  Take 50 mg by mouth 2 (two) times daily.     omeprazole 40 MG capsule  Commonly known as:  PRILOSEC  Take 1 capsule (40 mg total) by mouth 2 (two) times daily.     omeprazole 20 MG capsule  Commonly known as:  PRILOSEC  TAKE TWO   CAPSULES (40 MG TOTAL) BY MOUTH TWO   TIMES DAILY     potassium chloride 20 MEQ/15ML (10%) solution  Take 20 mEq by mouth daily.     PROAIR HFA 108 (90 BASE) MCG/ACT inhaler  Generic drug:  albuterol  Inhale 2 puffs into the lungs 4 (four) times daily as needed for wheezing or shortness of breath.     QUEtiapine 100 MG tablet  Commonly known as:  SEROQUEL  Take 1 tablet (100 mg total) by mouth at bedtime.     sertraline 50 MG tablet  Commonly known as:  ZOLOFT  Take 25 mg by mouth daily.     thiamine 100 MG tablet  Commonly known as:  VITAMIN B-1  Take 1 tablet (100 mg total) by mouth daily.  tiotropium 18 MCG inhalation capsule  Commonly known as:  SPIRIVA  Place 18 mcg into inhaler and inhale as needed.     traMADol 50 MG tablet  Commonly known as:  ULTRAM  Take 1 tablet (50 mg total) by mouth every 6 (six) hours as needed.         SignedConsuella Kerr, C 05/16/2014, 10:22 AM

## 2014-06-01 ENCOUNTER — Encounter: Payer: Self-pay | Admitting: Internal Medicine

## 2014-06-26 ENCOUNTER — Telehealth: Payer: Self-pay | Admitting: Cardiology

## 2014-06-26 NOTE — Telephone Encounter (Signed)
New message     Pt c/o Shortness Of Breath: STAT if SOB developed within the last 24 hours or pt is noticeably SOB on the phone  1. Are you currently SOB (can you hear that pt is SOB on the phone)?  Secretary at doctors office says yes  2. How long have you been experiencing SOB?   3. Are you SOB when sitting or when up moving around? Moving around  4. Are you currently experiencing any other symptoms? 15lb wt gain within 3-4 weeks, dyspnea on exertion

## 2014-06-26 NOTE — Telephone Encounter (Signed)
Rec'd a call from Maudie Mercury at [facility]. Patient is there being seen by PA. Patient experiencing SOB (described as mild to moderate and in no acute distress), BP 170/95, weight gain of 15 lbs over last three (3) weeks and LE "tight". No further information provided. PA at [facility] requesting patient to be seen today by [facility] office. Discussed with L.Gerhardt, NP, who advises that patient be seen in Emergency Department since he is symptomatic with major weight gain given patient's cardiac history. Information relayed back to Maudie Mercury at [facility]. A few minutes later PA from [facility] called back. Call routed to L.Gerhardt.

## 2014-06-30 ENCOUNTER — Ambulatory Visit (INDEPENDENT_AMBULATORY_CARE_PROVIDER_SITE_OTHER): Payer: Medicare Other | Admitting: Cardiology

## 2014-06-30 ENCOUNTER — Encounter: Payer: Self-pay | Admitting: Cardiology

## 2014-06-30 VITALS — BP 132/82 | HR 63 | Ht 69.0 in | Wt 143.0 lb

## 2014-06-30 DIAGNOSIS — I480 Paroxysmal atrial fibrillation: Secondary | ICD-10-CM | POA: Diagnosis not present

## 2014-06-30 DIAGNOSIS — I495 Sick sinus syndrome: Secondary | ICD-10-CM | POA: Diagnosis not present

## 2014-06-30 DIAGNOSIS — I5022 Chronic systolic (congestive) heart failure: Secondary | ICD-10-CM | POA: Diagnosis not present

## 2014-06-30 DIAGNOSIS — I5032 Chronic diastolic (congestive) heart failure: Secondary | ICD-10-CM | POA: Diagnosis not present

## 2014-06-30 MED ORDER — FUROSEMIDE 40 MG PO TABS: ORAL_TABLET | ORAL | Status: DC

## 2014-06-30 MED ORDER — POTASSIUM CHLORIDE CRYS ER 20 MEQ PO TBCR: EXTENDED_RELEASE_TABLET | ORAL | Status: DC

## 2014-06-30 NOTE — Patient Instructions (Addendum)
Take lasix (furosemide) 40mg  every other day.   Take KCL 20 meq every other day.  You have a follow-up appointment on April 19,2015 at 1:30PM with Dr Aundra Dubin.   Your physician recommends that you return for lab work in 1 week-- --BMET/BNP-this is scheduled for Friday April 8,2016. The lab is open from 7:30AM-5PM.  Use graded compression stockings to help the swelling in your feet and legs--Put them on in the morning and take them off at night.

## 2014-07-02 DIAGNOSIS — I5032 Chronic diastolic (congestive) heart failure: Secondary | ICD-10-CM | POA: Insufficient documentation

## 2014-07-02 NOTE — Progress Notes (Signed)
Patient ID: Freman Lapage, male   DOB: Dec 20, 1933, 81 y.o.   MRN: [id number] PCP: Dr. Ardeth Perfect  81 yo with history of paroxysmal atrial fibrillation and tachy-brady syndrome with [place] pacemaker presents for cardiology followup.  He was been followed by a cardiologist in [facility] in the past.  He has developed moderate dementia and moved to [place] to live in a nursing facility near his daughter.  He was seen in 9/15 in the ER with altered mental status.  CT head was negative, no definite cause was found.  He was on coumadin in the past for atrial fibrillation but now is off it due to frequent falls.  No stroke history.  He is a-paced today.  He uses a walker for stability.  L2 compression fracture treated with kyphoplasty in 2/16.    He is seen today because of increased ankle swelling for about 6 weeks.  He is here with his wife.  He was able to walk in.  She says that he seems more short of breath than his baseline.  However, patient denies any dyspnea.  He denies chest pain (wife says he has not told her about chest pain).  No lightheadedness or syncope.  He had a mechanical fall about 2 wks ago.  Weight is up 8 lbs. He was seen by the PA at his PCP's office and started on Lasix 20 mg daily.  He actually only takes it every other day.   ECG: a-paced, v-sensed  Labs (9/15): K 3.2, creatinine 0.79, LFTs normal Labs (1/16): HCT 37.8, K 4.9, creatinine 0.87  PMH: 1. Alzheimers dementia: Moderate.  2. 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  H/o esophageal stricture.   3. COPD 4. HTN 5. Tachy-brady syndrome: Chiropractor.  6. Diverticulosis 7. Depression 8. Prostate cancer s/p brachytherapy 9. H/o vertebral compression fracture 10. Atrial fibrillation: Paroxysmal.  Diagnosed initially in 2010 and started on coumadin.  Anticoagulation later stopped due to fall risk.  - Lexiscan Cardiolite (11/10) with no ischemia.  - Echo (11/10)  with EF 60-65%, trace MR.  - Echo (10/15) with EF 24-23%, PA systolic pressure 61 mmHg.  11. Carotid dopplers (8/15) with 1-39% bilateral ICA stenosis.  12. L2 compression fracture with kyphoplasty in 2/16.   SH: Lives at [facility].  Daughter accompanies him.  Prior heavy smoker, prior ETOH abuse.   FH: No premature CAD  ROS: All systems reviewed and negative except as per HPI.   Current Outpatient Prescriptions  Medication Sig Dispense Refill  . acetaminophen (TYLENOL) 500 MG tablet Take 500 mg by mouth every 8 (eight) hours as needed for moderate pain.     Marland Kitchen albuterol (PROAIR HFA) 108 (90 BASE) MCG/ACT inhaler Inhale 2 puffs into the lungs 4 (four) times daily as needed for wheezing or shortness of breath.     Marland Kitchen alum & mag hydroxide-simeth (MAALOX ADVANCED) 200-200-20 MG/5ML suspension Take 30 mLs by mouth every 6 (six) hours as needed for indigestion or heartburn.    Marland Kitchen amLODipine (NORVASC) 5 MG tablet Take 1 tablet (5 mg total) by mouth daily. 30 tablet 1  . amoxicillin (AMOXIL) 500 MG capsule Take 500 mg by mouth 3 (three) times daily.  0  . aspirin 81 MG tablet Take 81 mg by mouth daily.    . divalproex (DEPAKOTE SPRINKLE) 125 MG capsule Take 500 mg by mouth 2 (two) times daily.    . furosemide (LASIX) 40 MG tablet 1 tablet by mouth every  other day 15 tablet 3  . ibuprofen (ADVIL,MOTRIN) 200 MG tablet Take 200 mg by mouth every 6 (six) hours as needed (pain).    . LORazepam (ATIVAN) 0.5 MG tablet Take 0.5 mg by mouth 2 (two) times daily.     . memantine (NAMENDA) 10 MG tablet Take 10 mg by mouth 2 (two) times daily.    . metoprolol (LOPRESSOR) 50 MG tablet Take 50 mg by mouth 2 (two) times daily.    Marland Kitchen omeprazole (PRILOSEC) 20 MG capsule TAKE TWO   CAPSULES (40 MG TOTAL) BY MOUTH TWO   TIMES DAILY 120 capsule 3  . potassium chloride 20 MEQ/15ML (10%) solution Take 20 mEq by mouth daily.    . potassium chloride SA (K-DUR,KLOR-CON) 20 MEQ tablet 1 tablet by mouth every  other day 15 tablet 3  . QUEtiapine (SEROQUEL) 100 MG tablet Take 1 tablet (100 mg total) by mouth at bedtime. (Patient taking differently: Take 50 mg by mouth at bedtime. ) 30 tablet 0  . sertraline (ZOLOFT) 50 MG tablet Take 25 mg by mouth daily.     Marland Kitchen thiamine (VITAMIN B-1) 100 MG tablet Take 1 tablet (100 mg total) by mouth daily. 30 tablet 1  . tiotropium (SPIRIVA) 18 MCG inhalation capsule Place 18 mcg into inhaler and inhale as needed.     . traMADol (ULTRAM) 50 MG tablet Take 1 tablet (50 mg total) by mouth every 6 (six) hours as needed. 60 tablet 0   No current facility-administered medications for this visit.    BP 132/82 mmHg  Pulse 63  Ht 5\' 9"  (1.753 m)  Wt 143 lb (64.864 kg)  BMI 21.11 kg/m2 General: NAD Neck: No JVD, no thyromegaly or thyroid nodule.  Lungs: Distant breath sounds bilaterally. CV: Nondisplaced PMI.  Heart regular S1/S2, no S3/S4, no murmur.  1+ edema 1/2 up lower legs bilaterally.  No carotid bruit.  Normal pedal pulses.  Abdomen: Soft, nontender, no hepatosplenomegaly, no distention.  Skin: Intact without lesions or rashes.  Neurologic: Alert and oriented x 3.  Psych: Normal affect. Extremities: No clubbing or cyanosis.  HEENT: Normal.   Assessment/Plan: 1. Atrial fibrillation: Paroxysmal.  He is a-paced today.  He was taken off coumadin in the past because of fall risk.  He remains unstable per wife and has had mechanical falls recently.  I think that the safest course is likely going to be to keep him off anticoagulation.  He takes ASA 81 mg daily.  He will also continue metoprolol 50 mg bid.   2. Tachy-brady syndrome with [facility] pacemaker 3. Chronic systolic CHF: Echo 25/36 with EF 64-40% but PA systolic pressure 61 mmHg.  He has gained 8 lbs since last appointment.  Per wife, he seems more short of breath.  He has increased peripheral edema.  - I will increase Lasix to 40 mg every other day (he says that he cannot take it daily).  He will  also take KCl 20 qod.  - BMET/BNP in 1 week.  - He will wear graded compression stockings.   - Followup in 2 wks.  4. HTN: BP stable.  5. Dementia: Moderate. Wife gives most of history.   Loralie Champagne 07/02/2014

## 2014-07-07 ENCOUNTER — Other Ambulatory Visit (INDEPENDENT_AMBULATORY_CARE_PROVIDER_SITE_OTHER): Payer: Medicare Other | Admitting: *Deleted

## 2014-07-07 DIAGNOSIS — I5022 Chronic systolic (congestive) heart failure: Secondary | ICD-10-CM | POA: Diagnosis not present

## 2014-07-07 LAB — BASIC METABOLIC PANEL
BUN: 14 mg/dL (ref 6–23)
CO2: 27 mEq/L (ref 19–32)
Calcium: 8.7 mg/dL (ref 8.4–10.5)
Chloride: 105 mEq/L (ref 96–112)
Creatinine, Ser: 0.81 mg/dL (ref 0.40–1.50)
GFR: 97.74 mL/min (ref >60.00)
Glucose, Bld: 97 mg/dL (ref 70–99)
Potassium: 4.1 mEq/L (ref 3.5–5.1)
SODIUM: 138 meq/L (ref 135–145)

## 2014-07-07 LAB — BRAIN NATRIURETIC PEPTIDE: PRO B NATRI PEPTIDE: 274 pg/mL — AB (ref 0.0–100.0)

## 2014-07-07 NOTE — Addendum Note (Signed)
Addended by: Eulis Foster on: 07/07/2014 10:25 AM   Modules accepted: Orders

## 2014-07-18 ENCOUNTER — Ambulatory Visit: Payer: Medicare Other | Admitting: Cardiology

## 2014-07-25 ENCOUNTER — Other Ambulatory Visit: Payer: Self-pay | Admitting: Internal Medicine

## 2014-08-18 ENCOUNTER — Encounter: Payer: Self-pay | Admitting: Cardiology

## 2014-11-10 ENCOUNTER — Other Ambulatory Visit: Payer: Self-pay | Admitting: Internal Medicine

## 2014-11-24 ENCOUNTER — Encounter: Payer: Self-pay | Admitting: *Deleted

## 2014-12-18 ENCOUNTER — Ambulatory Visit: Payer: Medicare Other | Admitting: Neurology

## 2014-12-18 ENCOUNTER — Telehealth: Payer: Self-pay | Admitting: Neurology

## 2014-12-18 NOTE — Telephone Encounter (Signed)
Patient's wife Jerry Kerr is calling about her father's appointment today @1 :27. She states that she is the PA for her father and need a little time to speak with [name] alone/without her father as she states he is a dementia patient and is very combative and in denial.  I advised her to come on in at the regular appointment time unless the nurse called her and stated otherwise. Thanks!

## 2014-12-18 NOTE — Telephone Encounter (Signed)
Unfortunately we only have a half hour for the appointment. Ihave patients immediately before and afterwards. Would they like to reschedule for an appointment before lunch or at the end of the day so we can have more time?

## 2014-12-18 NOTE — Telephone Encounter (Signed)
Called and spoke to [place]. Rescheduled appt for 12/21/14 at 4:30pm so Dr Jaynee Eagles would have enough time for them. Explained that [name] has 30 minutes with him and would need extra time if she wanted to talk to her separately. She verbalized understanding.

## 2014-12-21 ENCOUNTER — Encounter: Payer: Self-pay | Admitting: Neurology

## 2014-12-21 ENCOUNTER — Ambulatory Visit (INDEPENDENT_AMBULATORY_CARE_PROVIDER_SITE_OTHER): Payer: Medicare Other | Admitting: Neurology

## 2014-12-21 VITALS — BP 162/87 | HR 68 | Ht 69.0 in | Wt 149.6 lb

## 2014-12-21 DIAGNOSIS — F03918 Unspecified dementia, unspecified severity, with other behavioral disturbance: Secondary | ICD-10-CM

## 2014-12-21 DIAGNOSIS — I10 Essential (primary) hypertension: Secondary | ICD-10-CM | POA: Diagnosis not present

## 2014-12-21 DIAGNOSIS — F1910 Other psychoactive substance abuse, uncomplicated: Secondary | ICD-10-CM

## 2014-12-21 DIAGNOSIS — Z8659 Personal history of other mental and behavioral disorders: Secondary | ICD-10-CM

## 2014-12-21 DIAGNOSIS — F0391 Unspecified dementia with behavioral disturbance: Secondary | ICD-10-CM

## 2014-12-21 DIAGNOSIS — F609 Personality disorder, unspecified: Secondary | ICD-10-CM

## 2014-12-21 NOTE — Patient Instructions (Signed)
Overall you are doing fairly well but I do want to suggest a few things today:   Remember to drink plenty of fluid, eat healthy meals and do not skip any meals. Try to eat protein with a every meal and eat a healthy snack such as fruit or nuts in between meals. Try to keep a regular sleep-wake schedule and try to exercise daily, particularly in the form of walking, 20-30 minutes a day, if you can.   As far as your medications are concerned, I would like to suggest; Continue the Namenda and the Seroquel  As far as diagnostic testing: EEG  I would like to see you back in 6 months, sooner if we need to. Please call us with any interim questions, concerns, problems, updates or refill requests.   Please also call us for any test results so we can go over those with you on the phone.  My clinical assistant and will answer any of your questions and relay your messages to me and also relay most of my messages to you.   Our phone number is [phone]. We also have an after hours call service for urgent matters and there is a physician on-call for urgent questions. For any emergencies you know to call 911 or go to the nearest emergency room

## 2014-12-21 NOTE — Progress Notes (Signed)
[facility]    Provider:  Dr Jaynee Kerr Referring Provider: [name], MD Primary Care Physician:  Jerry Hatchet, MD  CC:  Dementia with behavioral disturbance  HPI:  Jerry Kerr is a 81 y.o. male here as a referral from Jerry Kerr for Dementia with behavioral disturbance.  Here with daughter who provides most information. She wants to keep him safe. Patient has a very complicated past medical history including a long psychiatric history with substance abuse, former smoker of 2 packs per day for 30 years, behavioral disturbances, hypertension, afib,  anxiety, depression, encephalopathy, hallucinations, dementia, previous history of heavy liquor abuse, COPD, Barrett's esophagus, prostate cancer status post brachial therapy.  Patient has had problems all his life with substance abuse, psychiatric conditions, abusive and dangeroius behavior, "in  and out of rehab". Was living with his wife in [place] who no longer wants any responsibility in caring for him. Daughter has him in an apartment with 24x7 care. Previous to this he was in assisted living in [place] but was asked to leave due to his "exit-seeking behavior", he was even discovered on the road hitchhiking. In August 2015 he moved tablets what independent living facility accompanied by his constant caregivers. In August 2016, silver alert was issued as he was missing for 24 hours. He snuck out hopped on a bus and made it just 45 minutes for [place] for the police apprehended them. Tablets what asked him to leave as well. His cognition and his decision making skills have been progressively worsening. He has been diagnosed with dementia. Alcohol was thought contribute to his dementia.  It has been difficult to keep patient taking his medications, his caregivers provide all support for him including cooking, cleaning, medication management. Daughter manages all finances and all matter regarding patient care.  Behavioral disturbances which are worse when he refuses to take medications. He gets easily frustrated and confused, won't answer a lot questions, behavioral outbreaks. He has had hallucination. He has not tolerate anticholinesterase medications but he is on Namenda. No tremors. Brother with dementia.     He says he lives in [place] with his wife. He has been married 42 years. But he lives in [place] with caretakers. He thinks he lives alone with his wife.He is retired. He worked in Psychologist, educational. He says he has lived in [place] for 40 years. He says he forgets things sometimes but doesn't know what her forgets. He says he doesn't drive, he says the state doesn't want hin driving but doesn't know why, doesn't have any idea why the state took away his license. Daughter says he was driving reclessly and the [facility] required him to get a doctors note and doctor would not approve it.    Reviewed notes, labs and imaging from outside physicians, which showed: Patient's daughter comes in with psychological testing from 47. At that time documents state that (excepts): "he is a young man who is had a chronic difficulty in accepting any kind of responsibility and ostensibly sought admission for help and leading a more organized and constructive life. In addition to the question of the extent of his psychopathology there was considerable doubt about the depth of this man's motivation for change the degree as discomfort he was actually experiencing. The patient is seen through the test is a severe character disorder whose major pathology is manifested by a very low tolerance for anxiety, poor capacity for the delay of impulses, and the lack of depth and his interpersonal  relationships. He is a person of [place] normal to superior intelligence verbal IQ 118, performance IQ 123 and a full scale IQ of 122 whose talons do not light particularly in the capacity to deal with abstractions but rather manifested in his  ability to make shoot appraisals of the given social situation..... However he is an extremely cynical, suspicious and mistrustful person who these most interpersonal situations of the struggle for power.... He views ordinary.responsibilities as an excessive demand placed upon him Vicryl for teary and figures and he reacts to these pressures by evasive and rebellious strategies .Marland KitchenMarland KitchenMarland KitchenSuggested paranoid potential in this man which conceivably could become increasingly manifest should his behavior outlets for relieving tension no longer be available. His paranoid orientation is compounded by basic, underlying mistrust of the motives of others and a consequent need to be hyperalert and one-step with head of his many opponents..... Considerable degree of pressure from hostile impulses.... He is subject to rather sudden and abrupt outburst of aggressive behavior..... There is ample evidence of the testing that he is capable of cold, sadistic, and callous treatment of others and that he is given to little remorse about such behavior. This patient's relationships are primarily superficial and narcissistic oriented.  Impression: "Diagnostically the patient is seen as a severe character disorder who was given to impulsive behavior and he reveals a fairly low capacity for delay in for tolerating anxiety.... The patient is basically a very mistrustful, cynical individual who in addition reveals a fairly markedly potential for paranoid distortions of reality.. The patient is under fairly strong pressure from hostile, sadistic impulses... Consistent with strong addictive trends"  Personally reviewed CT of the head which only showed mild chronic white matter changes likely ischemic microvascular disease.   Bmp unremarkable. B12, rpr, tsh normal    Review of Systems: Patient complains of symptoms per HPI as well as the following symptoms: Chills, wheezing, shortness of breath, cold intolerance, incontinence of bowels,  snoring, urgency, back pain, walking difficulty, memory loss, agitation, behavior problem, confusion, decreased concentration, depression, nervousness. Pertinent negatives per HPI. All others negative.   Social History   Social History  . Marital Status: Married    Spouse Name: N/A  . Number of Children: 2  . Years of Education: 14   Occupational History  . Retired    Social History Main Topics  . 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 Smokeless tobacco: Never Used  . Alcohol Use: Yes     Comment: quit  . Drug Use: No  . Sexual Activity: No   Other Topics Concern  . Not on file   Social History Narrative   Lives at home with 24 hour caregivers.   Caffeine use: Drinks coffee occassionally     Family History  Problem Relation Age of Onset  . CAD Father   . Heart disease Father   . Hypertension Mother   . AAA (abdominal aortic aneurysm) Mother   . Heart disease Mother     Past Medical History  Diagnosis Date  . Asthma   . Dementia   . Back pain   . COPD (chronic obstructive pulmonary disease)   . Hypertension   . Pacemaker   . Anxiety   . Depression   . Cancer     SKIN, PROSTATE  . GERD (gastroesophageal reflux disease)   . Substance abuse     ETOH  . Dysphagia, unspecified(787.20) 12/12/2013  . Swallowing difficulty     SEES  Jerry. PYRTLE HAS HX ESOPHAGEAL DIL  .  Barrett's esophagus   . CHF (congestive heart failure)     chronic diastolic HF  . Dysrhythmia     atrial fibrillation    Past Surgical History  Procedure Laterality Date  . Neck surgery    . Upper gastrointestinal endoscopy    . Colonoscopy    . Ankle surgery    . Skin graft    . Pacemaker insertion    . Tonsillectomy    . Esophageal dilation    . Kyphoplasty Bilateral 05/05/2014    Procedure: KYPHOPLASTY LUMBAR TWO;  Surgeon: Consuella Lose, MD;  Location: [facility];  Service: Neurosurgery;  Laterality: Bilateral;    Current Outpatient Prescriptions  Medication Sig Dispense Refill  .  albuterol (PROVENTIL) (2.5 MG/3ML) 0.083% nebulizer solution Take 2.5 mg by nebulization every 6 (six) hours as needed for wheezing or shortness of breath.    . memantine (NAMENDA) 10 MG tablet Take 10 mg by mouth 2 (two) times daily.    Marland Kitchen omeprazole (PRILOSEC) 20 MG capsule TAKE TWO   CAPSULES (40 MG TOTAL) BY MOUTH TWO   TIMES DAILY 120 capsule 2  . QUEtiapine (SEROQUEL) 100 MG tablet Take 1 tablet (100 mg total) by mouth at bedtime. 30 tablet 0  . sertraline (ZOLOFT) 50 MG tablet Take 100 mg by mouth daily.      No current facility-administered medications for this visit.    Allergies as of 12/21/2014 - Review Complete 12/21/2014  Allergen Reaction Noted  . Hydrocodone Other (See Comments) 04/27/2014  . Other  12/21/2014    Vitals: BP 162/87 mmHg  Pulse 68  Ht 5\' 9"  (1.753 m)  Wt 149 lb 9.6 oz (67.858 kg)  BMI 22.08 kg/m2 Last Weight:  Wt Readings from Last 1 Encounters:  12/21/14 149 lb 9.6 oz (67.858 kg)   Last Height:   Ht Readings from Last 1 Encounters:  12/21/14 5\' 9"  (1.753 m)     Physical exam: Exam: Gen: NAD, not very conversant, not cooperative, well nourised, obese, well groomed                    CV: RRR, no MRG. No Carotid Bruits. No peripheral edema, warm, nontender Eyes: Conjunctivae clear without exudates or hemorrhage  Neuro: Detailed Neurologic Exam  Speech:    Speech is normal; fluent and spontaneous with normal comprehension.  Cognition:    The patient is oriented to person, place, and time;     recent and remote memory impaired;     language fluent;     Impaired attention, concentration,     fund of knowledge impaired  MoCA 20/30 (-2 visuospatial execution, -1 serial 7 subtraction, -1 language, -5 delayed recall, -1 orientation)  Cranial Nerves: smooth pursuit is impaired    The pupils are equal, round, and reactive to light. The fundi areflat.  Visual fields are full to finger confrontation. Extraocular movements are intact. Trigeminal  sensation is intact and the muscles of mastication are normal. The face is symmetric. The palate elevates in the midline. Hearing intact. Voice is normal. Shoulder shrug is normal. The tongue has normal motion without fasciculations.   Coordination:    Normal finger to nose and heel to shin.   Gait:    Stooped, antalgic gaot due to back pain, slow but not shuffling  Motor Observation: mild postural tremor    No asymmetry, no atrophy, and no involuntary movements noted. Tone:    Normal muscle tone.    Posture: mildly stooped  Strength: left hip f;exion weakness left >> right    Strength is V/V in the upper and lower limbs.      Sensation: intact to LT     Reflex Exam:  DTR's: absent AJs, brisk patellars, nml uppers    Toes:    The toes are upgoing bilaterally.   Clonus:    Clonus is absent.     Assessment/Plan:  81 year old patient with a complicated past medical history including a long psychiatric history with substance abuse, former smoker of 2 packs per day for 30 years, behavioral disturbances, hypertension, afib,  anxiety, depression, encephalopathy, hallucinations, dementia, previous history of heavy liquor abuse, COPD, Barrett's esophagus, prostate cancer status post therapy.  Patient has had problems all his life with substance abuse, psychiatric conditions, abusive and dangeroius behavior, "in  and out of rehab". Patient has been diagnosed with dementia previously by his primary care physician. He is here with his daughter who appears to be very concerned despite very difficult life long journey with her father. She appears very kind and seems take very good care of him with genuine for her dad. She would like to know how much of her father's difficulty is substance abuse, Orland Penman Alzheimer's disease or dementia and how much can be blamed on "Norval Slaven disease" as she puts it. I did have a long discussion with daughter that it is nearly impossible to tell. At this point  patient has significant cognitive impairments and dementia regardless of cifferent contributions from alcohol, substance abuse vs an Alzheimer's or other dementia. I did recommend seeing a psychiatrist to help manage his psychiatric manifestations with medication as needed. Recommended Jerry. Casimiro Needle who is a geriatric psychiatrist.   A report from 1959 is quite disturbing in diagnosis that patient with a severe character disorder who was given to impulsive behavior, a very mistrustful, cynical individual who in addition reveals a fairly markedly potential for paranoid distortions of reality under strong pressure from hostile, sadistic impulses, consistent with strong addictive trends.  Today's history and physical demonstrated very substantial and measurable cognitive losses consistent with dementia. Based on the prior experiences in the community and the substantial degree of impairment it is clear that patient does not have the capacity to make informed and appropriate decisions on his healthcare and finances. It is also clear that patient does not comprehend the degree of his cognitive losses, poor insight and judgement. On this basis I feel that patient should have a formal guardian of patient's person and financial affairs and someone who can continue to look out for the best interests of patient who is suffering from substantial cognitive impairment due to dementia and a long history of substance abuse and psychiatric problems.   Can request an EEG in the very unlikely chance that there is any seizure activity contributing to his symptoms, daughter states he has fluctuating confusion. Continue Namenda and Seroquel.   Sarina Ill, MD  [facility] [address] [place], [place] [address]  Phone [phone] Fax [phone]

## 2014-12-21 NOTE — Telephone Encounter (Signed)
Called and spoke with Jerry Kerr to see if she can come earlier than 4:30pm. Asked if she can check-in at 3:30pm for 4pm appointment. She says that she has carpool today but will come as soon as possible. She asked if we received letter and medical records she dropped off. I advised that we did receive it and I will give it to [name]. She verbalized understanding.

## 2014-12-24 ENCOUNTER — Encounter: Payer: Self-pay | Admitting: Neurology

## 2014-12-24 DIAGNOSIS — F03918 Unspecified dementia, unspecified severity, with other behavioral disturbance: Secondary | ICD-10-CM | POA: Insufficient documentation

## 2014-12-24 DIAGNOSIS — Z8659 Personal history of other mental and behavioral disorders: Secondary | ICD-10-CM | POA: Insufficient documentation

## 2014-12-24 DIAGNOSIS — F0391 Unspecified dementia with behavioral disturbance: Secondary | ICD-10-CM | POA: Insufficient documentation

## 2014-12-24 DIAGNOSIS — F1910 Other psychoactive substance abuse, uncomplicated: Secondary | ICD-10-CM | POA: Insufficient documentation

## 2014-12-25 NOTE — Telephone Encounter (Signed)
Called and spoke with Jerry Kerr to advise that [name] office note is done and she can come in and sign medical release for to get a copy. She requested I send a copy to his PCP, [name]. I told her I will fax that over today. She verbalized understanding.   Faxed recent office note to [name], MD at [phone].

## 2014-12-27 ENCOUNTER — Encounter: Payer: Self-pay | Admitting: *Deleted

## 2015-01-09 ENCOUNTER — Other Ambulatory Visit: Payer: Medicare Other

## 2015-01-26 ENCOUNTER — Other Ambulatory Visit: Payer: Self-pay | Admitting: Internal Medicine

## 2015-02-02 ENCOUNTER — Other Ambulatory Visit: Payer: Medicare Other

## 2015-02-08 ENCOUNTER — Other Ambulatory Visit: Payer: Medicare Other

## 2015-02-13 ENCOUNTER — Ambulatory Visit (INDEPENDENT_AMBULATORY_CARE_PROVIDER_SITE_OTHER): Payer: Medicare Other | Admitting: Internal Medicine

## 2015-02-13 ENCOUNTER — Encounter: Payer: Self-pay | Admitting: Internal Medicine

## 2015-02-13 VITALS — BP 140/92 | HR 62 | Ht 69.0 in | Wt 154.4 lb

## 2015-02-13 DIAGNOSIS — I5032 Chronic diastolic (congestive) heart failure: Secondary | ICD-10-CM

## 2015-02-13 DIAGNOSIS — I4891 Unspecified atrial fibrillation: Secondary | ICD-10-CM | POA: Diagnosis not present

## 2015-02-13 DIAGNOSIS — I495 Sick sinus syndrome: Secondary | ICD-10-CM | POA: Diagnosis not present

## 2015-02-13 LAB — CUP PACEART INCLINIC DEVICE CHECK
Brady Statistic RA Percent Paced: 0 %
Brady Statistic RV Percent Paced: 0 %
Implantable Lead Implant Date: 20110509
Implantable Lead Location: 753859
Implantable Lead Serial Number: 28690358
Lead Channel Impedance Value: 470 Ohm
Lead Channel Pacing Threshold Pulse Width: 0.4 ms
Lead Channel Pacing Threshold Pulse Width: 0.5 ms
Lead Channel Setting Pacing Amplitude: 1.4 V
Lead Channel Setting Pacing Amplitude: 2 V
Lead Channel Setting Sensing Sensitivity: 2.5 mV
MDC IDC LEAD IMPLANT DT: 20110509
MDC IDC LEAD LOCATION: 753860
MDC IDC LEAD SERIAL: 28700510
MDC IDC MSMT LEADCHNL RA PACING THRESHOLD AMPLITUDE: 0.6 V
MDC IDC MSMT LEADCHNL RA SENSING INTR AMPL: 1.8 mV
MDC IDC MSMT LEADCHNL RV IMPEDANCE VALUE: 530 Ohm
MDC IDC MSMT LEADCHNL RV PACING THRESHOLD AMPLITUDE: 1 V
MDC IDC MSMT LEADCHNL RV SENSING INTR AMPL: 12 mV — AB
MDC IDC PG SERIAL: 153770
MDC IDC SESS DTM: 20161115050000
MDC IDC SET LEADCHNL RV PACING PULSEWIDTH: 0.4 ms

## 2015-02-13 NOTE — Progress Notes (Signed)
Oh      Patient Care Team: [name], MD as PCP - General (Internal Medicine) Reymundo Poll, MD as Referring Physician (Family Medicine)   HPI  Jerry Kerr is a 81 y.o. male Seen to establish pacemaker follow-up. He has a history of paroxysmal atrial fibrillation and bradycardia in the status post [employer] implantation. He was followed at [facility]. Because of dementia and falls anticoagulation has been discontinued. He haD been treated with aspirin and that has been intercurrently discontinued.  He lives in an apartment near [place] with full-time caregivers.  He denies chest pain or shortness of breath.  He also carries a diagnosis of HFpEF. Records and Results Reviewed Echo EF 12/1558-65%    Past Medical History  Diagnosis Date  . Asthma   . Dementia   . Back pain   . COPD (chronic obstructive pulmonary disease) ([place])   . Hypertension   . Pacemaker   . Anxiety   . Depression   . Cancer (HCC)     SKIN, PROSTATE  . GERD (gastroesophageal reflux disease)   . Substance abuse     ETOH  . Dysphagia, unspecified(787.20) 12/12/2013  . Swallowing difficulty     SEES  DR. PYRTLE HAS HX ESOPHAGEAL DIL  . Barrett's esophagus   . CHF (congestive heart failure) (HCC)     chronic diastolic HF  . Dysrhythmia     atrial fibrillation    Past Surgical History  Procedure Laterality Date  . Neck surgery    . Upper gastrointestinal endoscopy    . Colonoscopy    . Ankle surgery    . Skin graft    . Pacemaker insertion    . Tonsillectomy    . Esophageal dilation    . Kyphoplasty Bilateral 05/05/2014    Procedure: KYPHOPLASTY LUMBAR TWO;  Surgeon: Consuella Lose, MD;  Location: [place] [facility];  Service: Neurosurgery;  Laterality: Bilateral;    Current Outpatient Prescriptions  Medication Sig Dispense Refill  . albuterol (PROVENTIL) (2.5 MG/3ML) 0.083% nebulizer solution Take 2.5 mg by nebulization every 6 (six) hours as needed for wheezing or shortness of  breath.    . memantine (NAMENDA) 10 MG tablet Take 10 mg by mouth 2 (two) times daily.    Marland Kitchen omeprazole (PRILOSEC) 20 MG capsule TAKE TWO   CAPSULES (40 MG TOTAL) BY MOUTH TWO   TIMES DAILY 120 capsule 2  . QUEtiapine (SEROQUEL) 100 MG tablet Take 1 tablet (100 mg total) by mouth at bedtime. 30 tablet 0  . sertraline (ZOLOFT) 50 MG tablet Take 100 mg by mouth daily.     . traMADol (ULTRAM) 50 MG tablet Take 50 mg by mouth every 8 (eight) hours as needed (back pain).   1   No current facility-administered medications for this visit.    Allergies  Allergen Reactions  . Hydrocodone Other (See Comments)    Confusion. agitation  . Other       Review of Systems negative except from HPI and PMH  Physical Exam Ht 5\' 9"  (1.753 m) Well developed and well nourished in no acute distress HENT normal E scleral and icterus clear Neck Supple JVP flat; carotids brisk and full Clear to ausculation Device pocket well healed; without hematoma or erythema.  There is no tethering   Regular rate and rhythm, no murmurs gallops or rub Soft with active bowel sounds No clubbing cyanosis  Edema Alert and oriented, grossly normal motor and sensory function Skin Warm and Dry  ecg sINUS  62 19/08/42  Assessment and  Plan  Tachybradycardia syndrome  Atrial fibrillation-paroxysmal  Falls history  Pacemaker-[employer]  Dementia-advanced   Patient's device function is normal. We'll plan to see him again in 6 months time.  No anticoagulation and no aspirin given history of falls. This is reasonable.

## 2015-02-13 NOTE — Patient Instructions (Signed)
Medication Instructions: - no changes  Labwork: - none  Procedures/Testing: - none  Follow-Up: - Your physician wants you to follow-up in: 6 months with the [facility] & 1 year with Dr. Klein. You will receive a reminder letter in the mail two months in advance. If you don't receive a letter, please call our office to schedule the follow-up appointment.  Any Additional Special Instructions Will Be Listed Below (If Applicable).   

## 2015-02-14 ENCOUNTER — Emergency Department (HOSPITAL_COMMUNITY)
Admission: EM | Admit: 2015-02-14 | Discharge: 2015-02-14 | Disposition: A | Discharge status: Home / Self Care | Payer: Medicare Other | Attending: Emergency Medicine | Admitting: Emergency Medicine

## 2015-02-14 ENCOUNTER — Emergency Department (HOSPITAL_COMMUNITY): Payer: Medicare Other

## 2015-02-14 ENCOUNTER — Encounter (HOSPITAL_COMMUNITY): Payer: Self-pay | Admitting: Emergency Medicine

## 2015-02-14 DIAGNOSIS — Y92481 Parking lot as the place of occurrence of the external cause: Secondary | ICD-10-CM | POA: Insufficient documentation

## 2015-02-14 DIAGNOSIS — K219 Gastro-esophageal reflux disease without esophagitis: Secondary | ICD-10-CM | POA: Insufficient documentation

## 2015-02-14 DIAGNOSIS — G309 Alzheimer's disease, unspecified: Secondary | ICD-10-CM | POA: Diagnosis not present

## 2015-02-14 DIAGNOSIS — S0081XA Abrasion of other part of head, initial encounter: Secondary | ICD-10-CM | POA: Insufficient documentation

## 2015-02-14 DIAGNOSIS — F419 Anxiety disorder, unspecified: Secondary | ICD-10-CM | POA: Diagnosis not present

## 2015-02-14 DIAGNOSIS — Z7982 Long term (current) use of aspirin: Secondary | ICD-10-CM | POA: Diagnosis not present

## 2015-02-14 DIAGNOSIS — Y9389 Activity, other specified: Secondary | ICD-10-CM | POA: Insufficient documentation

## 2015-02-14 DIAGNOSIS — Y998 Other external cause status: Secondary | ICD-10-CM | POA: Insufficient documentation

## 2015-02-14 DIAGNOSIS — Z79899 Other long term (current) drug therapy: Secondary | ICD-10-CM | POA: Insufficient documentation

## 2015-02-14 DIAGNOSIS — F0280 Dementia in other diseases classified elsewhere without behavioral disturbance: Secondary | ICD-10-CM | POA: Diagnosis not present

## 2015-02-14 DIAGNOSIS — Z85828 Personal history of other malignant neoplasm of skin: Secondary | ICD-10-CM | POA: Insufficient documentation

## 2015-02-14 DIAGNOSIS — Z87891 Personal history of nicotine dependence: Secondary | ICD-10-CM | POA: Diagnosis not present

## 2015-02-14 DIAGNOSIS — Z8546 Personal history of malignant neoplasm of prostate: Secondary | ICD-10-CM | POA: Diagnosis not present

## 2015-02-14 DIAGNOSIS — I10 Essential (primary) hypertension: Secondary | ICD-10-CM | POA: Insufficient documentation

## 2015-02-14 DIAGNOSIS — F329 Major depressive disorder, single episode, unspecified: Secondary | ICD-10-CM | POA: Insufficient documentation

## 2015-02-14 DIAGNOSIS — W01198A Fall on same level from slipping, tripping and stumbling with subsequent striking against other object, initial encounter: Secondary | ICD-10-CM | POA: Diagnosis not present

## 2015-02-14 DIAGNOSIS — J449 Chronic obstructive pulmonary disease, unspecified: Secondary | ICD-10-CM | POA: Diagnosis not present

## 2015-02-14 DIAGNOSIS — Z950 Presence of cardiac pacemaker: Secondary | ICD-10-CM | POA: Insufficient documentation

## 2015-02-14 DIAGNOSIS — J45909 Unspecified asthma, uncomplicated: Secondary | ICD-10-CM | POA: Diagnosis not present

## 2015-02-14 DIAGNOSIS — I5032 Chronic diastolic (congestive) heart failure: Secondary | ICD-10-CM | POA: Diagnosis not present

## 2015-02-14 DIAGNOSIS — S0990XA Unspecified injury of head, initial encounter: Secondary | ICD-10-CM | POA: Diagnosis present

## 2015-02-14 DIAGNOSIS — S0091XA Abrasion of unspecified part of head, initial encounter: Secondary | ICD-10-CM

## 2015-02-14 DIAGNOSIS — W19XXXA Unspecified fall, initial encounter: Secondary | ICD-10-CM

## 2015-02-14 MED ORDER — INFLUENZA VAC SPLIT QUAD 0.5 ML IM SUSY
0.5000 mL | PREFILLED_SYRINGE | Freq: Once | INTRAMUSCULAR | Status: DC
  Filled 2015-02-14: qty 0.5

## 2015-02-14 MED ORDER — TRAMADOL HCL 50 MG PO TABS: 50.0000 mg | ORAL_TABLET | Freq: Once | ORAL | Status: DC

## 2015-02-14 NOTE — Discharge Instructions (Signed)
Please follow-up with your primary care provider within one week. You were seen today for evaluation after a fall. Your CT scan did not show any evidence of bleeding or other injury. However, given your recent increased falls, it is important for you to follow-up with your primary care provider and use your walker when walking around.   Please obtain all of your results from medical records or have your doctors office obtain the results - share them with your doctor - you should be seen at your doctors office in the next 2 days. Call today to arrange your follow up. Take the medications as prescribed. Please review all of the medicines and only take them if you do not have an allergy to them. Please be aware that if you are taking birth control pills, taking other prescriptions, ESPECIALLY ANTIBIOTICS may make the birth control ineffective - if this is the case, either do not engage in sexual activity or use alternative methods of birth control such as condoms until you have finished the medicine and your family doctor says it is OK to restart them. If you are on a blood thinner such as COUMADIN, be aware that any other medicine that you take may cause the coumadin to either work too much, or not enough - you should have your coumadin level rechecked in next 7 days if this is the case.  ?  It is also a possibility that you have an allergic reaction to any of the medicines that you have been prescribed - Everybody reacts differently to medications and while MOST people have no trouble with most medicines, you may have a reaction such as nausea, vomiting, rash, swelling, shortness of breath. If this is the case, please stop taking the medicine immediately and contact your physician.  ?  You should return to the ER if you develop severe or worsening symptoms.

## 2015-02-14 NOTE — ED Provider Notes (Signed)
CSN: [id number]     Arrival date & time 02/14/15  1232 History   First MD Initiated Contact with Patient 02/14/15 1255     No chief complaint on file.  HPI   Jerry Kerr is an 81 y.o. male with h/o alzheimer's, dementia, afib, agitation, COPD, CHF who presents to the ED for evaluation following a mechanical fall. He is accompanied by his daughter who provides some of his history. Pt reports they were in the parking lot of the dentist office earlier getting in the car when he tripped over his own feet and fell. Pt's daughter reports she witnessed the fall. She states that pt hit his head on the car tire, has an abrasion on the crown of his head, but did not witness any LOC or seizure-like activity. Pt denies any pain right now. Denies headache, dizziness, visual changes, neck pain. He is not currently on anticoagulation therapy. Pt's daughter states that she is worried because pt has had increased falls lately but refuses to use his walker. He lives at home with two fulltime caregivers. Today in the ED Jerry Kerr is cooperative, alert, and oriented. Pt is not on anticoagulation therapy.  Past Medical History  Diagnosis Date  . Asthma   . Dementia   . Back pain   . COPD (chronic obstructive pulmonary disease) ([place])   . Hypertension   . Pacemaker   . Anxiety   . Depression   . Cancer (HCC)     SKIN, PROSTATE  . GERD (gastroesophageal reflux disease)   . Substance abuse     ETOH  . Dysphagia, unspecified(787.20) 12/12/2013  . Swallowing difficulty     SEES  DR. PYRTLE HAS HX ESOPHAGEAL DIL  . Barrett's esophagus   . CHF (congestive heart failure) (HCC)     chronic diastolic HF  . Dysrhythmia     atrial fibrillation   Past Surgical History  Procedure Laterality Date  . Neck surgery    . Upper gastrointestinal endoscopy    . Colonoscopy    . Ankle surgery    . Skin graft    . Pacemaker insertion    . Tonsillectomy    . Esophageal dilation    . Kyphoplasty Bilateral 05/05/2014   Procedure: KYPHOPLASTY LUMBAR TWO;  Surgeon: Consuella Lose, MD;  Location: [place] [facility];  Service: Neurosurgery;  Laterality: Bilateral;   Family History  Problem Relation Age of Onset  . CAD Father   . Heart disease Father   . Hypertension Mother   . AAA (abdominal aortic aneurysm) Mother   . Heart disease Mother   . Dementia Neg Hx    Social History  Substance Use Topics  . 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 Smokeless tobacco: Never Used  . Alcohol Use: Yes     Comment: quit    Review of Systems  All other systems reviewed and are negative.     Allergies  Hydrocodone and Other  Home Medications   Prior to Admission medications   Medication Sig Start Date End Date Taking? Authorizing Provider  albuterol (PROVENTIL HFA;VENTOLIN HFA) 108 (90 BASE) MCG/ACT inhaler Inhale into the lungs every 6 (six) hours as needed for wheezing or shortness of breath.   Yes Historical Provider, MD  albuterol (PROVENTIL) (2.5 MG/3ML) 0.083% nebulizer solution Take 2.5 mg by nebulization every 6 (six) hours as needed for wheezing or shortness of breath.   Yes Historical Provider, MD  aspirin EC 81 MG tablet Take 81 mg by mouth daily.  Yes Historical Provider, MD  divalproex (DEPAKOTE SPRINKLE) 125 MG capsule Take 500 mg by mouth 2 (two) times daily.   Yes Historical Provider, MD  memantine (NAMENDA) 10 MG tablet Take 10 mg by mouth 2 (two) times daily.   Yes Historical Provider, MD  QUEtiapine (SEROQUEL) 100 MG tablet Take 1 tablet (100 mg total) by mouth at bedtime. 12/16/13  Yes Orson Eva, MD  sertraline (ZOLOFT) 100 MG tablet Take 100 mg by mouth daily.   Yes Historical Provider, MD  sertraline (ZOLOFT) 50 MG tablet Take 100 mg by mouth daily.    Yes Historical Provider, MD  traMADol (ULTRAM) 50 MG tablet Take 50 mg by mouth every 8 (eight) hours as needed (back pain).  02/06/15  Yes Historical Provider, MD  omeprazole (PRILOSEC) 20 MG capsule TAKE TWO   CAPSULES (40 MG TOTAL) BY MOUTH TWO    TIMES DAILY Patient not taking: Reported on 02/14/2015 01/26/15   Jerene Bears, MD   BP 148/83 mmHg  Pulse 64  Temp(Src) 98 F (36.7 C) (Oral)  Resp 13  SpO2 96% Physical Exam  Constitutional: He is oriented to person, place, and time. He is cooperative. No distress.  HENT:  Right Ear: External ear normal.  Left Ear: External ear normal.  Nose: Nose normal.  Mouth/Throat: Oropharynx is clear and moist. No oropharyngeal exudate.  Eyes: Conjunctivae and EOM are normal. Pupils are equal, round, and reactive to light.  Neck: Normal range of motion. Neck supple. No tracheal deviation present.  Cardiovascular: Normal rate, regular rhythm, normal heart sounds and intact distal pulses.   No murmur heard. Pulmonary/Chest: Effort normal and breath sounds normal. No respiratory distress. He has no wheezes.  Abdominal: Soft. Bowel sounds are normal. He exhibits no distension. There is no tenderness.  Musculoskeletal: Normal range of motion. He exhibits no edema or tenderness.  Lymphadenopathy:    He has no cervical adenopathy.  Neurological: He is alert and oriented to person, place, and time. He has normal strength. No cranial nerve deficit. He displays a negative Romberg sign.  Slow gait, consistent with baseline  Skin: Skin is warm and dry. He is not diaphoretic.  Superficial abrasion to crown of head. No bleeding, swelling, or erythema. Nontender. No foreign bodies.  Psychiatric: He has a normal mood and affect.  Nursing note and vitals reviewed.   ED Course  Procedures (including critical care time) Labs Review Labs Reviewed - No data to display  Imaging Review Ct Head Wo Contrast  02/14/2015  CLINICAL DATA:  Status post fall.  Initial encounter. EXAM: CT HEAD WITHOUT CONTRAST TECHNIQUE: Contiguous axial images were obtained from the base of the skull through the vertex without intravenous contrast. COMPARISON:  Head CT 12/14/2013. FINDINGS: There is no evidence of acute  intracranial hemorrhage, mass lesion, brain edema or extra-axial fluid collection. The ventricles and subarachnoid spaces are prominent but state. There is no CT evidence of acute cortical infarction. There are stable mild chronic small vessel ischemic changes in the periventricular white matter. Prominent intracranial vascular calcifications are noted. There is mild right maxillary sinus mucosal thickening. The visualized paranasal sinuses, mastoid air cells and middle ears are otherwise clear. The calvarium is intact. IMPRESSION: No acute intracranial or calvarial findings. Stable atrophy and chronic small vessel ischemic changes. Electronically Signed   By: Richardean Sale M.D.   On: 02/14/2015 14:09   I have personally reviewed and evaluated these images and lab results as part of my medical decision-making.  EKG Interpretation None      MDM   Final diagnoses:  Fall, initial encounter  Abrasion of head, initial encounter    Pt is alert and oriented in the ED. He is cooperative and pleasant today. Nonfocal neuro exam. Wound on head is very superficial and does not require any kind of closure. However given pt's age and co-morbidities will get non-con CT to r/o any acute intra-cranial pathology. I do not anticipate any emergent findings. Anticipate d/c home with pcp f/u.  CT unremarkable. D/c home with strict return precautions. Highly encouraged pt to use his walker or look into other walking assist devices he would be more willing to use.     Anne Ng, PA-C 02/14/15 2238  Sherwood Gambler, MD 02/15/15 [phone]

## 2015-02-14 NOTE — ED Notes (Signed)
Patient transported to CT 

## 2015-02-14 NOTE — ED Notes (Signed)
Pt left prior to receiving meds or discharge papers. MD notified

## 2015-02-14 NOTE — ED Notes (Signed)
Arrives with daughter, mechanical, witnessed fall with no LOC at dentist, abrasion to head, no other trauma, no pain, NAD

## 2015-03-01 ENCOUNTER — Ambulatory Visit (INDEPENDENT_AMBULATORY_CARE_PROVIDER_SITE_OTHER): Payer: Medicare Other | Admitting: Neurology

## 2015-03-01 DIAGNOSIS — F03918 Unspecified dementia, unspecified severity, with other behavioral disturbance: Secondary | ICD-10-CM

## 2015-03-01 DIAGNOSIS — R4182 Altered mental status, unspecified: Secondary | ICD-10-CM

## 2015-03-01 DIAGNOSIS — F0391 Unspecified dementia with behavioral disturbance: Secondary | ICD-10-CM

## 2015-03-01 NOTE — Procedures (Signed)
   HISTORY: 81 years old male, with dementia, behavior disturbance  TECHNIQUE:  16 channel EEG was performed based on standard 10-16 international system. One channel was dedicated to EKG, which has demonstrates normal sinus rhythm.  Upon awakening, the posterior background activity was well-developed, in theta range, reactive to eye opening and closure.  There was no evidence of epileptiform discharge.  Photic stimulation and hyperventilation was not performed  No sleep was achieved.  CONCLUSION: This is a abnormal EEG.  There is generalized background slowing, consistent with bihemisphere malfunction, common etiology of advanced central nervous system degenerative disorder, dementia, metabolic toxic etiology

## 2015-03-02 ENCOUNTER — Telehealth: Payer: Self-pay | Admitting: *Deleted

## 2015-03-02 NOTE — Telephone Encounter (Signed)
Spoke w/ Jerry Kerr [facility]) paperwork on file- about results per Dr Jaynee Eagles note. She verbalized understanding.

## 2015-03-02 NOTE — Telephone Encounter (Signed)
-----   Message from Melvenia Beam, MD sent at 03/02/2015  1:04 PM EST ----- Let daughter know that there were no seizures seen on the eeg. The eeg did show generalized brain activity slowing which is consistent with dementia. thanks

## 2015-04-03 ENCOUNTER — Ambulatory Visit: Payer: Medicare Other | Attending: Internal Medicine

## 2015-04-19 ENCOUNTER — Other Ambulatory Visit: Payer: Self-pay | Admitting: Internal Medicine

## 2015-05-21 ENCOUNTER — Encounter (HOSPITAL_COMMUNITY): Payer: Self-pay | Admitting: Emergency Medicine

## 2015-05-21 ENCOUNTER — Emergency Department (HOSPITAL_COMMUNITY)
Admission: EM | Admit: 2015-05-21 | Discharge: 2015-05-21 | Disposition: A | Discharge status: Home / Self Care | Payer: Medicare Other | Attending: Emergency Medicine | Admitting: Emergency Medicine

## 2015-05-21 DIAGNOSIS — J45909 Unspecified asthma, uncomplicated: Secondary | ICD-10-CM | POA: Insufficient documentation

## 2015-05-21 DIAGNOSIS — J449 Chronic obstructive pulmonary disease, unspecified: Secondary | ICD-10-CM | POA: Diagnosis not present

## 2015-05-21 DIAGNOSIS — I10 Essential (primary) hypertension: Secondary | ICD-10-CM | POA: Diagnosis not present

## 2015-05-21 DIAGNOSIS — F0390 Unspecified dementia without behavioral disturbance: Secondary | ICD-10-CM | POA: Insufficient documentation

## 2015-05-21 DIAGNOSIS — I509 Heart failure, unspecified: Secondary | ICD-10-CM | POA: Diagnosis not present

## 2015-05-21 NOTE — ED Notes (Signed)
Pt sent by dentist for eval of HTN today while at office for procedure. Pt with hx of HTN and was taking BP meds but PCP took him off because pt started to have hypotension. Pt denies any headache, blurred vision, or any other complaints at this time. Axo x4 in triage.

## 2015-05-21 NOTE — ED Notes (Signed)
Pt family sts since pt BP is decreasing they are leaving to go to PCP. sts pt is feeling fine and wants to go,

## 2015-05-23 ENCOUNTER — Encounter (HOSPITAL_COMMUNITY): Payer: Self-pay | Admitting: *Deleted

## 2015-05-23 ENCOUNTER — Emergency Department (INDEPENDENT_AMBULATORY_CARE_PROVIDER_SITE_OTHER): Payer: Medicare Other

## 2015-05-23 ENCOUNTER — Emergency Department (INDEPENDENT_AMBULATORY_CARE_PROVIDER_SITE_OTHER)
Admission: EM | Admit: 2015-05-23 | Discharge: 2015-05-23 | Disposition: A | Discharge status: Home / Self Care | Payer: Medicare Other | Source: Home / Self Care | Attending: Family Medicine | Admitting: Family Medicine

## 2015-05-23 DIAGNOSIS — H109 Unspecified conjunctivitis: Secondary | ICD-10-CM

## 2015-05-23 DIAGNOSIS — S93401A Sprain of unspecified ligament of right ankle, initial encounter: Secondary | ICD-10-CM | POA: Diagnosis not present

## 2015-05-23 MED ORDER — MOXIFLOXACIN HCL 0.5 % OP SOLN: 1.0000 [drp] | Freq: Three times a day (TID) | OPHTHALMIC | Status: DC

## 2015-05-23 NOTE — ED Provider Notes (Signed)
CSN: [id number]     Arrival date & time 05/23/15  1545 History   First MD Initiated Contact with Patient 05/23/15 1713     Chief Complaint  Patient presents with  . Fall   (Consider location/radiation/quality/duration/timing/severity/associated sxs/prior Treatment) Patient is a 82 y.o. male presenting with fall. The history is provided by a caregiver. The history is limited by the condition of the patient (pt with Alzheimers.).  Fall This is a new problem. The current episode started yesterday (has been falling recently). The problem has not changed since onset.Associated symptoms comments: Also with red right eye since yest.. The symptoms are aggravated by walking.    Past Medical History  Diagnosis Date  . Asthma   . Dementia   . Back pain   . COPD (chronic obstructive pulmonary disease) ([place])   . Hypertension   . Pacemaker   . Anxiety   . Depression   . Cancer (HCC)     SKIN, PROSTATE  . GERD (gastroesophageal reflux disease)   . Substance abuse     ETOH  . Dysphagia, unspecified(787.20) 12/12/2013  . Swallowing difficulty     SEES  DR. PYRTLE HAS HX ESOPHAGEAL DIL  . Barrett's esophagus   . CHF (congestive heart failure) (HCC)     chronic diastolic HF  . Dysrhythmia     atrial fibrillation   Past Surgical History  Procedure Laterality Date  . Neck surgery    . Upper gastrointestinal endoscopy    . Colonoscopy    . Ankle surgery    . Skin graft    . Pacemaker insertion    . Tonsillectomy    . Esophageal dilation    . Kyphoplasty Bilateral 05/05/2014    Procedure: KYPHOPLASTY LUMBAR TWO;  Surgeon: Consuella Lose, MD;  Location: [place] [facility];  Service: Neurosurgery;  Laterality: Bilateral;   Family History  Problem Relation Age of Onset  . CAD Father   . Heart disease Father   . Hypertension Mother   . AAA (abdominal aortic aneurysm) Mother   . Heart disease Mother   . Dementia Neg Hx    Social History  Substance Use Topics  . Smoking status: Former  Research scientist (life sciences)  . Smokeless tobacco: Never Used  . Alcohol Use: Yes     Comment: quit    Review of Systems  Constitutional: Negative.   HENT: Negative.   Eyes: Positive for discharge and redness.  Musculoskeletal: Positive for joint swelling and gait problem.  Skin: Negative.   Psychiatric/Behavioral: Positive for behavioral problems.  All other systems reviewed and are negative.   Allergies  Hydrocodone and Other  Home Medications   Prior to Admission medications   Medication Sig Start Date End Date Taking? Authorizing Provider  albuterol (PROVENTIL HFA;VENTOLIN HFA) 108 (90 BASE) MCG/ACT inhaler Inhale into the lungs every 6 (six) hours as needed for wheezing or shortness of breath.    Historical Provider, MD  albuterol (PROVENTIL) (2.5 MG/3ML) 0.083% nebulizer solution Take 2.5 mg by nebulization every 6 (six) hours as needed for wheezing or shortness of breath.    Historical Provider, MD  aspirin EC 81 MG tablet Take 81 mg by mouth daily.    Historical Provider, MD  divalproex (DEPAKOTE SPRINKLE) 125 MG capsule Take 500 mg by mouth 2 (two) times daily.    Historical Provider, MD  memantine (NAMENDA) 10 MG tablet Take 10 mg by mouth 2 (two) times daily.    Historical Provider, MD  omeprazole (PRILOSEC) 20 MG capsule TAKE  TWO   CAPSULES (40 MG TOTAL) BY MOUTH TWO   TIMES DAILY 04/19/15   Jerene Bears, MD  QUEtiapine (SEROQUEL) 100 MG tablet Take 1 tablet (100 mg total) by mouth at bedtime. 12/16/13   Orson Eva, MD  sertraline (ZOLOFT) 100 MG tablet Take 100 mg by mouth daily.    Historical Provider, MD  sertraline (ZOLOFT) 50 MG tablet Take 100 mg by mouth daily.     Historical Provider, MD  traMADol (ULTRAM) 50 MG tablet Take 50 mg by mouth every 8 (eight) hours as needed (back pain).  02/06/15   Historical Provider, MD   Meds Ordered and Administered this Visit  Medications - No data to display  BP 168/97 mmHg  Pulse 79  Temp(Src) 98.7 F (37.1 C) (Oral)  Resp 18  SpO2 95% No data  found.   Physical Exam  Constitutional: He appears well-developed and well-nourished.  Eyes: EOM are normal. Pupils are equal, round, and reactive to light. Right conjunctiva is injected. Left conjunctiva is not injected.  Musculoskeletal: He exhibits tenderness.       Right ankle: He exhibits decreased range of motion, swelling and ecchymosis. Achilles tendon normal.  Neurological: He is alert.  Skin: Skin is warm and dry.  Nursing note and vitals reviewed.   ED Course  Procedures (including critical care time)  Labs Review Labs Reviewed - No data to display  Imaging Review Dg Ankle Complete Right  05/23/2015  CLINICAL DATA:  Fall yesterday with right ankle pain, initial encounter EXAM: RIGHT ANKLE - COMPLETE 3+ VIEW COMPARISON:  None. FINDINGS: No acute fracture or dislocation is noted. A well corticated calcification is noted in the anterior aspect of the foot which may be related to prior avulsion fracture. No acute abnormality is seen. IMPRESSION: No acute abnormality noted. Electronically Signed   By: Inez Catalina M.D.   On: 05/23/2015 18:12     Visual Acuity Review  Right Eye Distance:   Left Eye Distance:   Bilateral Distance:    Right Eye Near:   Left Eye Near:    Bilateral Near:         MDM   1. Ankle sprain, right, initial encounter   2. Conjunctivitis of right eye    Meds ordered this encounter  Medications  . moxifloxacin (VIGAMOX) 0.5 % ophthalmic solution    Sig: Place 1 drop into the right eye 3 (three) times daily.    Dispense:  3 mL    Refill:  0        Billy Fischer, MD 05/23/15 [phone]

## 2015-05-23 NOTE — Discharge Instructions (Signed)
Warm soak to eye and eye medicine as prescribed, see your eye doctor if problems, see orthopedist if ankle problems.

## 2015-05-23 NOTE — ED Notes (Signed)
Caregiver     Reports  Pt  Resides  In  Assisted  Living          And  Has       Dementia   And  Has  Fallen       sev  Times  Over  The  Last  4   Days         he  Reports  Back pain    Foot  Pain   With  Swelling  And  Redness  To  The r  Foot       Pt  Ambulates  With     Financial planner  At   Bedside

## 2015-06-01 ENCOUNTER — Emergency Department (HOSPITAL_COMMUNITY): Payer: Medicare Other

## 2015-06-01 ENCOUNTER — Encounter (HOSPITAL_COMMUNITY): Payer: Self-pay | Admitting: *Deleted

## 2015-06-01 ENCOUNTER — Other Ambulatory Visit (HOSPITAL_BASED_OUTPATIENT_CLINIC_OR_DEPARTMENT_OTHER): Payer: Self-pay | Admitting: Internal Medicine

## 2015-06-01 ENCOUNTER — Inpatient Hospital Stay (HOSPITAL_COMMUNITY)
Admission: EM | Admit: 2015-06-01 | Discharge: 2015-06-08 | DRG: 177 | Disposition: A | Discharge status: Hospice | Payer: Medicare Other | Attending: Internal Medicine | Admitting: Internal Medicine

## 2015-06-01 DIAGNOSIS — J690 Pneumonitis due to inhalation of food and vomit: Secondary | ICD-10-CM | POA: Diagnosis not present

## 2015-06-01 DIAGNOSIS — G9341 Metabolic encephalopathy: Secondary | ICD-10-CM | POA: Diagnosis present

## 2015-06-01 DIAGNOSIS — F0390 Unspecified dementia without behavioral disturbance: Secondary | ICD-10-CM | POA: Diagnosis present

## 2015-06-01 DIAGNOSIS — I110 Hypertensive heart disease with heart failure: Secondary | ICD-10-CM | POA: Diagnosis present

## 2015-06-01 DIAGNOSIS — Z885 Allergy status to narcotic agent status: Secondary | ICD-10-CM

## 2015-06-01 DIAGNOSIS — J189 Pneumonia, unspecified organism: Secondary | ICD-10-CM | POA: Diagnosis present

## 2015-06-01 DIAGNOSIS — I10 Essential (primary) hypertension: Secondary | ICD-10-CM | POA: Diagnosis present

## 2015-06-01 DIAGNOSIS — E785 Hyperlipidemia, unspecified: Secondary | ICD-10-CM | POA: Diagnosis present

## 2015-06-01 DIAGNOSIS — Z8659 Personal history of other mental and behavioral disorders: Secondary | ICD-10-CM

## 2015-06-01 DIAGNOSIS — R6510 Systemic inflammatory response syndrome (SIRS) of non-infectious origin without acute organ dysfunction: Secondary | ICD-10-CM | POA: Diagnosis present

## 2015-06-01 DIAGNOSIS — R4182 Altered mental status, unspecified: Secondary | ICD-10-CM

## 2015-06-01 DIAGNOSIS — R627 Adult failure to thrive: Secondary | ICD-10-CM | POA: Diagnosis present

## 2015-06-01 DIAGNOSIS — I4891 Unspecified atrial fibrillation: Secondary | ICD-10-CM | POA: Diagnosis present

## 2015-06-01 DIAGNOSIS — Z79899 Other long term (current) drug therapy: Secondary | ICD-10-CM

## 2015-06-01 DIAGNOSIS — Z888 Allergy status to other drugs, medicaments and biological substances status: Secondary | ICD-10-CM

## 2015-06-01 DIAGNOSIS — F609 Personality disorder, unspecified: Secondary | ICD-10-CM | POA: Diagnosis present

## 2015-06-01 DIAGNOSIS — W109XXA Fall (on) (from) unspecified stairs and steps, initial encounter: Secondary | ICD-10-CM | POA: Diagnosis present

## 2015-06-01 DIAGNOSIS — W108XXA Fall (on) (from) other stairs and steps, initial encounter: Secondary | ICD-10-CM | POA: Diagnosis present

## 2015-06-01 DIAGNOSIS — F419 Anxiety disorder, unspecified: Secondary | ICD-10-CM | POA: Diagnosis present

## 2015-06-01 DIAGNOSIS — Z87891 Personal history of nicotine dependence: Secondary | ICD-10-CM

## 2015-06-01 DIAGNOSIS — E43 Unspecified severe protein-calorie malnutrition: Secondary | ICD-10-CM | POA: Insufficient documentation

## 2015-06-01 DIAGNOSIS — Z7189 Other specified counseling: Secondary | ICD-10-CM | POA: Insufficient documentation

## 2015-06-01 DIAGNOSIS — Z8249 Family history of ischemic heart disease and other diseases of the circulatory system: Secondary | ICD-10-CM

## 2015-06-01 DIAGNOSIS — Z6820 Body mass index (BMI) 20.0-20.9, adult: Secondary | ICD-10-CM

## 2015-06-01 DIAGNOSIS — E860 Dehydration: Secondary | ICD-10-CM | POA: Diagnosis present

## 2015-06-01 DIAGNOSIS — J449 Chronic obstructive pulmonary disease, unspecified: Secondary | ICD-10-CM | POA: Diagnosis present

## 2015-06-01 DIAGNOSIS — Z950 Presence of cardiac pacemaker: Secondary | ICD-10-CM

## 2015-06-01 DIAGNOSIS — Z66 Do not resuscitate: Secondary | ICD-10-CM | POA: Diagnosis present

## 2015-06-01 DIAGNOSIS — W19XXXA Unspecified fall, initial encounter: Secondary | ICD-10-CM | POA: Diagnosis present

## 2015-06-01 DIAGNOSIS — K219 Gastro-esophageal reflux disease without esophagitis: Secondary | ICD-10-CM | POA: Diagnosis present

## 2015-06-01 DIAGNOSIS — R1313 Dysphagia, pharyngeal phase: Secondary | ICD-10-CM | POA: Diagnosis present

## 2015-06-01 DIAGNOSIS — F0281 Dementia in other diseases classified elsewhere with behavioral disturbance: Secondary | ICD-10-CM | POA: Diagnosis present

## 2015-06-01 DIAGNOSIS — Z22322 Carrier or suspected carrier of Methicillin resistant Staphylococcus aureus: Secondary | ICD-10-CM

## 2015-06-01 DIAGNOSIS — Z9119 Patient's noncompliance with other medical treatment and regimen: Secondary | ICD-10-CM

## 2015-06-01 DIAGNOSIS — I5032 Chronic diastolic (congestive) heart failure: Secondary | ICD-10-CM | POA: Diagnosis present

## 2015-06-01 DIAGNOSIS — G9340 Encephalopathy, unspecified: Secondary | ICD-10-CM | POA: Diagnosis present

## 2015-06-01 DIAGNOSIS — F329 Major depressive disorder, single episode, unspecified: Secondary | ICD-10-CM | POA: Diagnosis present

## 2015-06-01 DIAGNOSIS — Z7982 Long term (current) use of aspirin: Secondary | ICD-10-CM

## 2015-06-01 DIAGNOSIS — Z515 Encounter for palliative care: Secondary | ICD-10-CM | POA: Insufficient documentation

## 2015-06-01 DIAGNOSIS — G309 Alzheimer's disease, unspecified: Secondary | ICD-10-CM | POA: Diagnosis present

## 2015-06-01 DIAGNOSIS — R54 Age-related physical debility: Secondary | ICD-10-CM | POA: Diagnosis present

## 2015-06-01 DIAGNOSIS — R296 Repeated falls: Secondary | ICD-10-CM | POA: Diagnosis present

## 2015-06-01 LAB — COMPREHENSIVE METABOLIC PANEL
ALT: 9 U/L — AB (ref 17–63)
ANION GAP: 16 — AB (ref 5–15)
AST: 27 U/L (ref 15–41)
Albumin: 3.3 g/dL — ABNORMAL LOW (ref 3.5–5.0)
Alkaline Phosphatase: 119 U/L (ref 38–126)
BUN: 12 mg/dL (ref 6–20)
CHLORIDE: 99 mmol/L — AB (ref 101–111)
CO2: 25 mmol/L (ref 22–32)
CREATININE: 1.09 mg/dL (ref 0.61–1.24)
Calcium: 9.7 mg/dL (ref 8.9–10.3)
Glucose, Bld: 89 mg/dL (ref 65–99)
Potassium: 4.7 mmol/L (ref 3.5–5.1)
SODIUM: 140 mmol/L (ref 135–145)
Total Bilirubin: 0.7 mg/dL (ref 0.3–1.2)
Total Protein: 7.1 g/dL (ref 6.5–8.1)

## 2015-06-01 LAB — URINALYSIS, ROUTINE W REFLEX MICROSCOPIC
Glucose, UA: NEGATIVE mg/dL
Ketones, ur: 15 mg/dL — AB
LEUKOCYTES UA: NEGATIVE
NITRITE: NEGATIVE
PH: 6 (ref 5.0–8.0)
Protein, ur: 30 mg/dL — AB
SPECIFIC GRAVITY, URINE: 1.023 (ref 1.005–1.030)

## 2015-06-01 LAB — CBC WITH DIFFERENTIAL/PLATELET
BASOS ABS: 0 10*3/uL (ref 0.0–0.1)
BASOS PCT: 0 %
Eosinophils Absolute: 0.1 10*3/uL (ref 0.0–0.7)
Eosinophils Relative: 1 %
HEMATOCRIT: 39 % (ref 39.0–52.0)
HEMOGLOBIN: 11.8 g/dL — AB (ref 13.0–17.0)
LYMPHS PCT: 21 %
Lymphs Abs: 1.3 10*3/uL (ref 0.7–4.0)
MCH: 27.1 pg (ref 26.0–34.0)
MCHC: 30.3 g/dL (ref 30.0–36.0)
MCV: 89.7 fL (ref 78.0–100.0)
MONOS PCT: 13 %
Monocytes Absolute: 0.8 10*3/uL (ref 0.1–1.0)
NEUTROS ABS: 4.2 10*3/uL (ref 1.7–7.7)
NEUTROS PCT: 65 %
Platelets: 153 10*3/uL (ref 150–400)
RBC: 4.35 MIL/uL (ref 4.22–5.81)
RDW: 16.6 % — ABNORMAL HIGH (ref 11.5–15.5)
WBC: 6.4 10*3/uL (ref 4.0–10.5)

## 2015-06-01 LAB — URINE MICROSCOPIC-ADD ON: WBC, UA: NONE SEEN WBC/hpf (ref 0–5)

## 2015-06-01 LAB — BRAIN NATRIURETIC PEPTIDE: B Natriuretic Peptide: 231.1 pg/mL — ABNORMAL HIGH (ref 0.0–100.0)

## 2015-06-01 LAB — I-STAT CG4 LACTIC ACID, ED
LACTIC ACID, VENOUS: 1.9 mmol/L (ref 0.5–2.0)
Lactic Acid, Venous: 1.42 mmol/L (ref 0.5–2.0)

## 2015-06-01 LAB — CBG MONITORING, ED: GLUCOSE-CAPILLARY: 73 mg/dL (ref 65–99)

## 2015-06-01 NOTE — ED Notes (Signed)
Pt sent here by Dr's office for altered mental status.  He at [facility] at [facility] by himself with 24 around the clock assistance.  His wife and caregiver were taking him to pcp's for follow up appointment to tx htn/ankle sprain but he was lethargic and not conversational.  Hx of dementia.

## 2015-06-01 NOTE — ED Provider Notes (Addendum)
CSN: [id number]     Arrival date & time 06/01/15  1806 History   First MD Initiated Contact with Patient 06/01/15 2126     Chief Complaint  Patient presents with  . Altered Mental Status     (Consider location/radiation/quality/duration/timing/severity/associated sxs/prior Treatment) HPI Level V caveat dementia history is obtained from patient's wife and his daughter patient has had worsening cough, no known fever.. Patient has fallen several times in the past 1 or 2 weeks. He suffered a sprained his right foot at last injury. He is also progressively more confused over the past 2 weeks. No treatment prior to coming here. Wife reports that he has not been able to walk at all today.  Past Medical History  Diagnosis Date  . Asthma   . Dementia   . Back pain   . COPD (chronic obstructive pulmonary disease) ([place])   . Hypertension   . Pacemaker   . Anxiety   . Depression   . Cancer (HCC)     SKIN, PROSTATE  . GERD (gastroesophageal reflux disease)   . Substance abuse     ETOH  . Dysphagia, unspecified(787.20) 12/12/2013  . Swallowing difficulty     SEES  DR. PYRTLE HAS HX ESOPHAGEAL DIL  . Barrett's esophagus   . CHF (congestive heart failure) (HCC)     chronic diastolic HF  . Dysrhythmia     atrial fibrillation  Congestive heart failure  Past Surgical History  Procedure Laterality Date  . Neck surgery    . Upper gastrointestinal endoscopy    . Colonoscopy    . Ankle surgery    . Skin graft    . Pacemaker insertion    . Tonsillectomy    . Esophageal dilation    . Kyphoplasty Bilateral 05/05/2014    Procedure: KYPHOPLASTY LUMBAR TWO;  Surgeon: Consuella Lose, MD;  Location: [facility];  Service: Neurosurgery;  Laterality: Bilateral;   Family History  Problem Relation Age of Onset  . CAD Father   . Heart disease Father   . Hypertension Mother   . AAA (abdominal aortic aneurysm) Mother   . Heart disease Mother   . Dementia Neg Hx    Social History  Substance Use  Topics  . 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 Smokeless tobacco: Never Used  . Alcohol Use: Yes     Comment: quit    Review of Systems  Unable to perform ROS: Dementia  Respiratory: Positive for cough.   Musculoskeletal: Positive for gait problem.       Walks with walker      Allergies  Hydrocodone and Other  Home Medications   Prior to Admission medications   Medication Sig Start Date End Date Taking? Authorizing Provider  albuterol (PROVENTIL HFA;VENTOLIN HFA) 108 (90 BASE) MCG/ACT inhaler Inhale into the lungs every 6 (six) hours as needed for wheezing or shortness of breath.    Historical Provider, MD  albuterol (PROVENTIL) (2.5 MG/3ML) 0.083% nebulizer solution Take 2.5 mg by nebulization every 6 (six) hours as needed for wheezing or shortness of breath.    Historical Provider, MD  aspirin EC 81 MG tablet Take 81 mg by mouth daily.    Historical Provider, MD  divalproex (DEPAKOTE SPRINKLE) 125 MG capsule Take 500 mg by mouth 2 (two) times daily.    Historical Provider, MD  memantine (NAMENDA) 10 MG tablet Take 10 mg by mouth 2 (two) times daily.    Historical Provider, MD  moxifloxacin (VIGAMOX) 0.5 % ophthalmic solution  Place 1 drop into the right eye 3 (three) times daily. 05/23/15   Billy Fischer, MD  omeprazole (PRILOSEC) 20 MG capsule TAKE TWO   CAPSULES (40 MG TOTAL) BY MOUTH TWO   TIMES DAILY 04/19/15   Jerene Bears, MD  QUEtiapine (SEROQUEL) 100 MG tablet Take 1 tablet (100 mg total) by mouth at bedtime. 12/16/13   Orson Eva, MD  sertraline (ZOLOFT) 100 MG tablet Take 100 mg by mouth daily.    Historical Provider, MD  sertraline (ZOLOFT) 50 MG tablet Take 100 mg by mouth daily.     Historical Provider, MD  traMADol (ULTRAM) 50 MG tablet Take 50 mg by mouth every 8 (eight) hours as needed (back pain).  02/06/15   Historical Provider, MD   BP 163/100 mmHg  Pulse 95  Temp(Src) 100.3 F (37.9 C) (Oral)  Resp 18  Ht 5\' 9"  (1.753 m)  Wt 140 lb (63.504 kg)  BMI 20.67 kg/m2   SpO2 92% Physical Exam  Constitutional:  Chronically ill-appearing  HENT:  Head: Normocephalic and atraumatic.  Poor dentition  Eyes: Conjunctivae are normal. Pupils are equal, round, and reactive to light.  Neck: Neck supple. No tracheal deviation present. No thyromegaly present.  Cardiovascular: Normal rate and regular rhythm.   No murmur heard. Pulmonary/Chest: Effort normal. No respiratory distress.  Diffuse coarse rhonchi  Abdominal: Soft. Bowel sounds are normal. He exhibits no distension. There is no tenderness.  Musculoskeletal: Normal range of motion. He exhibits no edema or tenderness.  Neurological: He is alert. Coordination normal.  Skin: Skin is warm and dry. No rash noted.  Psychiatric: He has a normal mood and affect.  Nursing note and vitals reviewed.   ED Course  Procedures (including critical care time) Labs Review Labs Reviewed  COMPREHENSIVE METABOLIC PANEL - Abnormal; Notable for the following:    Chloride 99 (*)    Albumin 3.3 (*)    ALT 9 (*)    Anion gap 16 (*)    All other components within normal limits  CBC WITH DIFFERENTIAL/PLATELET - Abnormal; Notable for the following:    Hemoglobin 11.8 (*)    RDW 16.6 (*)    All other components within normal limits  CULTURE, BLOOD (ROUTINE X 2)  CULTURE, BLOOD (ROUTINE X 2)  URINE CULTURE  URINALYSIS, ROUTINE W REFLEX MICROSCOPIC (NOT AT [facility])  I-STAT CG4 LACTIC ACID, ED  CBG MONITORING, ED  I-STAT CG4 LACTIC ACID, ED    Imaging Review Dg Chest 1 View  06/01/2015  CLINICAL DATA:  Confusion, weakness, shaking, shortness of breath recently, history cancer, asthma, CHF, hypertension, COPD, dementia EXAM: CHEST 1 VIEW COMPARISON:  12/13/2013 FINDINGS: Kyphotic positioning. LEFT subclavian pacemaker leads project over RIGHT atrium and RIGHT ventricle. Stable heart size and mediastinal contours. Minimal bibasilar atelectasis. Probable underlying emphysematous changes. No definite infiltrate, pleural effusion or  gross pneumothorax, though portions of the apices are obscured by the patient's head. IMPRESSION: Probable COPD changes with minimal bibasilar atelectasis. Electronically Signed   By: Lavonia Dana M.D.   On: 06/01/2015 19:05   I have personally reviewed and evaluated these images and lab results as part of my medical decision-making.   EKG Interpretation   Date/Time:  Friday June 01 2015 22:01:02 EST Ventricular Rate:  79 PR Interval:  163 QRS Duration: 86 QT Interval:  367 QTC Calculation: 421 R Axis:   71 Text Interpretation:  Sinus rhythm Anteroseptal infarct, old Nonspecific T  abnormalities, inferior leads No significant change since last  tracing  Confirmed by Winfred Leeds  MD, [place] [phone]) on 06/01/2015 10:19:58 PM      Results for orders placed or performed during the hospital encounter of 06/01/15  Comprehensive metabolic panel  Result Value Ref Range   Sodium 140 135 - 145 mmol/L   Potassium 4.7 3.5 - 5.1 mmol/L   Chloride 99 (L) 101 - 111 mmol/L   CO2 25 22 - 32 mmol/L   Glucose, Bld 89 65 - 99 mg/dL   BUN 12 6 - 20 mg/dL   Creatinine, Ser 1.09 0.61 - 1.24 mg/dL   Calcium 9.7 8.9 - 10.3 mg/dL   Total Protein 7.1 6.5 - 8.1 g/dL   Albumin 3.3 (L) 3.5 - 5.0 g/dL   AST 27 15 - 41 U/L   ALT 9 (L) 17 - 63 U/L   Alkaline Phosphatase 119 38 - 126 U/L   Total Bilirubin 0.7 0.3 - 1.2 mg/dL   GFR calc non Af Amer >60 >60 mL/min   GFR calc Af Amer >60 >60 mL/min   Anion gap 16 (H) 5 - 15  Urinalysis, Routine w reflex microscopic (not at [facility])  Result Value Ref Range   Color, Urine AMBER (A) YELLOW   APPearance CLEAR CLEAR   Specific Gravity, Urine 1.023 1.005 - 1.030   pH 6.0 5.0 - 8.0   Glucose, UA NEGATIVE NEGATIVE mg/dL   Hgb urine dipstick TRACE (A) NEGATIVE   Bilirubin Urine MODERATE (A) NEGATIVE   Ketones, ur 15 (A) NEGATIVE mg/dL   Protein, ur 30 (A) NEGATIVE mg/dL   Nitrite NEGATIVE NEGATIVE   Leukocytes, UA NEGATIVE NEGATIVE  CBC with Differential  Result Value  Ref Range   WBC 6.4 4.0 - 10.5 K/uL   RBC 4.35 4.22 - 5.81 MIL/uL   Hemoglobin 11.8 (L) 13.0 - 17.0 g/dL   HCT 39.0 39.0 - 52.0 %   MCV 89.7 78.0 - 100.0 fL   MCH 27.1 26.0 - 34.0 pg   MCHC 30.3 30.0 - 36.0 g/dL   RDW 16.6 (H) 11.5 - 15.5 %   Platelets 153 150 - 400 K/uL   Neutrophils Relative % 65 %   Neutro Abs 4.2 1.7 - 7.7 K/uL   Lymphocytes Relative 21 %   Lymphs Abs 1.3 0.7 - 4.0 K/uL   Monocytes Relative 13 %   Monocytes Absolute 0.8 0.1 - 1.0 K/uL   Eosinophils Relative 1 %   Eosinophils Absolute 0.1 0.0 - 0.7 K/uL   Basophils Relative 0 %   Basophils Absolute 0.0 0.0 - 0.1 K/uL  Brain natriuretic peptide  Result Value Ref Range   B Natriuretic Peptide 231.1 (H) 0.0 - 100.0 pg/mL  Urine microscopic-add on  Result Value Ref Range   Squamous Epithelial / LPF 0-5 (A) NONE SEEN   WBC, UA NONE SEEN 0 - 5 WBC/hpf   RBC / HPF 0-5 0 - 5 RBC/hpf   Bacteria, UA RARE (A) NONE SEEN  I-Stat CG4 Lactic Acid, ED  (not at [facility])  Result Value Ref Range   Lactic Acid, Venous 1.90 0.5 - 2.0 mmol/L  CBG monitoring, ED  Result Value Ref Range   Glucose-Capillary 73 65 - 99 mg/dL  I-Stat CG4 Lactic Acid, ED  (not at [facility])  Result Value Ref Range   Lactic Acid, Venous 1.42 0.5 - 2.0 mmol/L   Dg Chest 1 View  06/01/2015  CLINICAL DATA:  Confusion, weakness, shaking, shortness of breath recently, history cancer, asthma, CHF, hypertension, COPD, dementia EXAM: CHEST  1 VIEW COMPARISON:  12/13/2013 FINDINGS: Kyphotic positioning. LEFT subclavian pacemaker leads project over RIGHT atrium and RIGHT ventricle. Stable heart size and mediastinal contours. Minimal bibasilar atelectasis. Probable underlying emphysematous changes. No definite infiltrate, pleural effusion or gross pneumothorax, though portions of the apices are obscured by the patient's head. IMPRESSION: Probable COPD changes with minimal bibasilar atelectasis. Electronically Signed   By: Lavonia Dana M.D.   On: 06/01/2015 19:05   Dg  Ankle Complete Right  05/23/2015  CLINICAL DATA:  Fall yesterday with right ankle pain, initial encounter EXAM: RIGHT ANKLE - COMPLETE 3+ VIEW COMPARISON:  None. FINDINGS: No acute fracture or dislocation is noted. A well corticated calcification is noted in the anterior aspect of the foot which may be related to prior avulsion fracture. No acute abnormality is seen. IMPRESSION: No acute abnormality noted. Electronically Signed   By: Inez Catalina M.D.   On: 05/23/2015 18:12   Ct Head Wo Contrast  06/01/2015  CLINICAL DATA:  Altered mental status.  History of dementia. EXAM: CT HEAD WITHOUT CONTRAST TECHNIQUE: Contiguous axial images were obtained from the base of the skull through the vertex without intravenous contrast. COMPARISON:  02/14/2015 FINDINGS: Sinuses/Soft tissues: Minimal mucosal thickening of bilateral maxillary sinuses and the left sphenoid sinus. Hypoplastic frontal sinuses. Clear mastoid air cells. Intracranial: Mild low density in the periventricular white matter likely related to small vessel disease. Cerebral and cerebellar atrophy. No mass lesion, hemorrhage, hydrocephalus, acute infarct, intra-axial, or extra-axial fluid collection. IMPRESSION: 1.  No acute intracranial abnormality. 2.  Cerebral atrophy and small vessel ischemic change. 3. Sinus disease. Electronically Signed   By: Abigail Miyamoto M.D.   On: 06/01/2015 23:44    12:03 PM patient noted to be febrile and tachypnea. Code status discussed with pts wife and daughter who is healthcare POA. For now pt is full code MDM  Code sepsis called in light of surgical criteria, fever, tachypnea. Likely source respiratory in light of increased cough. Treated for community-acquired pneumonia. Dr. Grandville Silos consulted. Will see patient in emergency department. Plan intravenous antibiotics and intravenous fluids. Admission to telemetry. Influenza panel pending.   palliativeCare consult ordered Patient meets clinical criteria for sepsis with at  least 2 SIRS and suspected source of infection community-acquired pneumonia.  Diagnoses #1 sepsis #2 community acquired pneumonia  Final diagnoses:  Altered mental status, unspecified altered mental status type        Orlie Dakin, MD 06/02/15 [place], MD 06/02/15 [phone]

## 2015-06-02 ENCOUNTER — Inpatient Hospital Stay (HOSPITAL_COMMUNITY): Payer: Medicare Other

## 2015-06-02 ENCOUNTER — Encounter (HOSPITAL_COMMUNITY): Payer: Self-pay | Admitting: Internal Medicine

## 2015-06-02 DIAGNOSIS — R6510 Systemic inflammatory response syndrome (SIRS) of non-infectious origin without acute organ dysfunction: Secondary | ICD-10-CM

## 2015-06-02 DIAGNOSIS — Z7982 Long term (current) use of aspirin: Secondary | ICD-10-CM | POA: Diagnosis not present

## 2015-06-02 DIAGNOSIS — R1313 Dysphagia, pharyngeal phase: Secondary | ICD-10-CM | POA: Diagnosis present

## 2015-06-02 DIAGNOSIS — I4891 Unspecified atrial fibrillation: Secondary | ICD-10-CM | POA: Diagnosis present

## 2015-06-02 DIAGNOSIS — F609 Personality disorder, unspecified: Secondary | ICD-10-CM | POA: Diagnosis present

## 2015-06-02 DIAGNOSIS — R1310 Dysphagia, unspecified: Secondary | ICD-10-CM | POA: Diagnosis not present

## 2015-06-02 DIAGNOSIS — R54 Age-related physical debility: Secondary | ICD-10-CM | POA: Diagnosis present

## 2015-06-02 DIAGNOSIS — Z8249 Family history of ischemic heart disease and other diseases of the circulatory system: Secondary | ICD-10-CM | POA: Diagnosis not present

## 2015-06-02 DIAGNOSIS — J690 Pneumonitis due to inhalation of food and vomit: Secondary | ICD-10-CM | POA: Diagnosis present

## 2015-06-02 DIAGNOSIS — J189 Pneumonia, unspecified organism: Secondary | ICD-10-CM | POA: Diagnosis not present

## 2015-06-02 DIAGNOSIS — Z950 Presence of cardiac pacemaker: Secondary | ICD-10-CM | POA: Diagnosis not present

## 2015-06-02 DIAGNOSIS — F419 Anxiety disorder, unspecified: Secondary | ICD-10-CM | POA: Diagnosis present

## 2015-06-02 DIAGNOSIS — R627 Adult failure to thrive: Secondary | ICD-10-CM | POA: Diagnosis not present

## 2015-06-02 DIAGNOSIS — E785 Hyperlipidemia, unspecified: Secondary | ICD-10-CM | POA: Diagnosis present

## 2015-06-02 DIAGNOSIS — G308 Other Alzheimer's disease: Secondary | ICD-10-CM | POA: Diagnosis not present

## 2015-06-02 DIAGNOSIS — Z79899 Other long term (current) drug therapy: Secondary | ICD-10-CM | POA: Diagnosis not present

## 2015-06-02 DIAGNOSIS — Z22322 Carrier or suspected carrier of Methicillin resistant Staphylococcus aureus: Secondary | ICD-10-CM | POA: Diagnosis not present

## 2015-06-02 DIAGNOSIS — E860 Dehydration: Secondary | ICD-10-CM | POA: Diagnosis not present

## 2015-06-02 DIAGNOSIS — I482 Chronic atrial fibrillation: Secondary | ICD-10-CM

## 2015-06-02 DIAGNOSIS — J449 Chronic obstructive pulmonary disease, unspecified: Secondary | ICD-10-CM | POA: Diagnosis present

## 2015-06-02 DIAGNOSIS — W109XXA Fall (on) (from) unspecified stairs and steps, initial encounter: Secondary | ICD-10-CM | POA: Diagnosis present

## 2015-06-02 DIAGNOSIS — Z7189 Other specified counseling: Secondary | ICD-10-CM | POA: Diagnosis not present

## 2015-06-02 DIAGNOSIS — R296 Repeated falls: Secondary | ICD-10-CM | POA: Diagnosis present

## 2015-06-02 DIAGNOSIS — R4182 Altered mental status, unspecified: Secondary | ICD-10-CM | POA: Diagnosis not present

## 2015-06-02 DIAGNOSIS — I110 Hypertensive heart disease with heart failure: Secondary | ICD-10-CM | POA: Diagnosis present

## 2015-06-02 DIAGNOSIS — Z66 Do not resuscitate: Secondary | ICD-10-CM | POA: Diagnosis present

## 2015-06-02 DIAGNOSIS — Z885 Allergy status to narcotic agent status: Secondary | ICD-10-CM | POA: Diagnosis not present

## 2015-06-02 DIAGNOSIS — F329 Major depressive disorder, single episode, unspecified: Secondary | ICD-10-CM | POA: Diagnosis present

## 2015-06-02 DIAGNOSIS — G9340 Encephalopathy, unspecified: Secondary | ICD-10-CM | POA: Diagnosis not present

## 2015-06-02 DIAGNOSIS — F0281 Dementia in other diseases classified elsewhere with behavioral disturbance: Secondary | ICD-10-CM | POA: Diagnosis present

## 2015-06-02 DIAGNOSIS — J439 Emphysema, unspecified: Secondary | ICD-10-CM

## 2015-06-02 DIAGNOSIS — E43 Unspecified severe protein-calorie malnutrition: Secondary | ICD-10-CM | POA: Insufficient documentation

## 2015-06-02 DIAGNOSIS — I5032 Chronic diastolic (congestive) heart failure: Secondary | ICD-10-CM | POA: Diagnosis present

## 2015-06-02 DIAGNOSIS — Z515 Encounter for palliative care: Secondary | ICD-10-CM | POA: Diagnosis not present

## 2015-06-02 DIAGNOSIS — Z6820 Body mass index (BMI) 20.0-20.9, adult: Secondary | ICD-10-CM | POA: Diagnosis not present

## 2015-06-02 DIAGNOSIS — I10 Essential (primary) hypertension: Secondary | ICD-10-CM

## 2015-06-02 DIAGNOSIS — F0390 Unspecified dementia without behavioral disturbance: Secondary | ICD-10-CM | POA: Diagnosis not present

## 2015-06-02 DIAGNOSIS — K219 Gastro-esophageal reflux disease without esophagitis: Secondary | ICD-10-CM

## 2015-06-02 DIAGNOSIS — G9341 Metabolic encephalopathy: Secondary | ICD-10-CM | POA: Diagnosis present

## 2015-06-02 DIAGNOSIS — Z888 Allergy status to other drugs, medicaments and biological substances status: Secondary | ICD-10-CM | POA: Diagnosis not present

## 2015-06-02 DIAGNOSIS — Z87891 Personal history of nicotine dependence: Secondary | ICD-10-CM | POA: Diagnosis not present

## 2015-06-02 DIAGNOSIS — Z9119 Patient's noncompliance with other medical treatment and regimen: Secondary | ICD-10-CM | POA: Diagnosis not present

## 2015-06-02 DIAGNOSIS — G309 Alzheimer's disease, unspecified: Secondary | ICD-10-CM | POA: Diagnosis present

## 2015-06-02 LAB — COMPREHENSIVE METABOLIC PANEL
ALK PHOS: 98 U/L (ref 38–126)
ALT: 8 U/L — AB (ref 17–63)
ANION GAP: 10 (ref 5–15)
AST: 23 U/L (ref 15–41)
Albumin: 2.7 g/dL — ABNORMAL LOW (ref 3.5–5.0)
BUN: 13 mg/dL (ref 6–20)
CALCIUM: 8.3 mg/dL — AB (ref 8.9–10.3)
CHLORIDE: 105 mmol/L (ref 101–111)
CO2: 23 mmol/L (ref 22–32)
CREATININE: 0.95 mg/dL (ref 0.61–1.24)
Glucose, Bld: 115 mg/dL — ABNORMAL HIGH (ref 65–99)
Potassium: 4 mmol/L (ref 3.5–5.1)
Sodium: 138 mmol/L (ref 135–145)
Total Bilirubin: 0.7 mg/dL (ref 0.3–1.2)
Total Protein: 6 g/dL — ABNORMAL LOW (ref 6.5–8.1)

## 2015-06-02 LAB — CBC WITH DIFFERENTIAL/PLATELET
Basophils Absolute: 0 10*3/uL (ref 0.0–0.1)
Basophils Relative: 0 %
EOS ABS: 0.1 10*3/uL (ref 0.0–0.7)
EOS PCT: 2 %
HCT: 32.7 % — ABNORMAL LOW (ref 39.0–52.0)
Hemoglobin: 9.8 g/dL — ABNORMAL LOW (ref 13.0–17.0)
LYMPHS ABS: 1.1 10*3/uL (ref 0.7–4.0)
LYMPHS PCT: 22 %
MCH: 26.8 pg (ref 26.0–34.0)
MCHC: 30 g/dL (ref 30.0–36.0)
MCV: 89.3 fL (ref 78.0–100.0)
MONOS PCT: 11 %
Monocytes Absolute: 0.5 10*3/uL (ref 0.1–1.0)
Neutro Abs: 3.2 10*3/uL (ref 1.7–7.7)
Neutrophils Relative %: 65 %
PLATELETS: 124 10*3/uL — AB (ref 150–400)
RBC: 3.66 MIL/uL — AB (ref 4.22–5.81)
RDW: 16.7 % — ABNORMAL HIGH (ref 11.5–15.5)
WBC: 4.9 10*3/uL (ref 4.0–10.5)

## 2015-06-02 LAB — LACTIC ACID, PLASMA
LACTIC ACID, VENOUS: 0.7 mmol/L (ref 0.5–2.0)
LACTIC ACID, VENOUS: 0.7 mmol/L (ref 0.5–2.0)

## 2015-06-02 LAB — STREP PNEUMONIAE URINARY ANTIGEN: STREP PNEUMO URINARY ANTIGEN: NEGATIVE

## 2015-06-02 LAB — TSH: TSH: 4.688 u[IU]/mL — ABNORMAL HIGH (ref 0.350–4.500)

## 2015-06-02 LAB — HIV ANTIBODY (ROUTINE TESTING W REFLEX): HIV SCREEN 4TH GENERATION: NONREACTIVE

## 2015-06-02 LAB — MAGNESIUM: Magnesium: 1.7 mg/dL (ref 1.7–2.4)

## 2015-06-02 LAB — MRSA PCR SCREENING: MRSA BY PCR: POSITIVE — AB

## 2015-06-02 LAB — EXPECTORATED SPUTUM ASSESSMENT W REFEX TO RESP CULTURE

## 2015-06-02 LAB — INFLUENZA PANEL BY PCR (TYPE A & B)
H1N1 flu by pcr: NOT DETECTED
Influenza A By PCR: NEGATIVE
Influenza B By PCR: NEGATIVE

## 2015-06-02 MED ORDER — IPRATROPIUM BROMIDE 0.02 % IN SOLN
0.5000 mg | RESPIRATORY_TRACT | Status: DC | PRN
  Administered 2015-06-03: 0.5 mg via RESPIRATORY_TRACT
  Filled 2015-06-02: qty 2.5

## 2015-06-02 MED ORDER — ENSURE ENLIVE PO LIQD
237.0000 mL | Freq: Two times a day (BID) | ORAL | Status: DC
  Administered 2015-06-05: 237 mL via ORAL

## 2015-06-02 MED ORDER — QUETIAPINE FUMARATE 50 MG PO TABS
100.0000 mg | ORAL_TABLET | Freq: Every day | ORAL | Status: DC
  Administered 2015-06-03 – 2015-06-05 (×3): 100 mg via ORAL
  Filled 2015-06-02 (×4): qty 2

## 2015-06-02 MED ORDER — ASPIRIN EC 81 MG PO TBEC
81.0000 mg | DELAYED_RELEASE_TABLET | Freq: Every day | ORAL | Status: DC
  Administered 2015-06-06: 81 mg via ORAL
  Filled 2015-06-02: qty 1

## 2015-06-02 MED ORDER — FLUTICASONE PROPIONATE 50 MCG/ACT NA SUSP
2.0000 | Freq: Every day | NASAL | Status: DC
  Administered 2015-06-02 – 2015-06-08 (×7): 2 via NASAL
  Filled 2015-06-02: qty 16

## 2015-06-02 MED ORDER — LEVALBUTEROL HCL 0.63 MG/3ML IN NEBU
0.6300 mg | INHALATION_SOLUTION | Freq: Four times a day (QID) | RESPIRATORY_TRACT | Status: DC
  Administered 2015-06-02: 0.63 mg via RESPIRATORY_TRACT
  Filled 2015-06-02: qty 3

## 2015-06-02 MED ORDER — DIVALPROEX SODIUM 125 MG PO CSDR
500.0000 mg | DELAYED_RELEASE_CAPSULE | Freq: Two times a day (BID) | ORAL | Status: DC
  Administered 2015-06-03 – 2015-06-06 (×4): 500 mg via ORAL
  Filled 2015-06-02 (×13): qty 4

## 2015-06-02 MED ORDER — OSELTAMIVIR PHOSPHATE 30 MG PO CAPS
30.0000 mg | ORAL_CAPSULE | Freq: Two times a day (BID) | ORAL | Status: DC
  Administered 2015-06-02: 30 mg via ORAL
  Filled 2015-06-02 (×2): qty 1

## 2015-06-02 MED ORDER — DEXTROSE 5 % IV SOLN
500.0000 mg | INTRAVENOUS | Status: DC
  Administered 2015-06-02 – 2015-06-07 (×6): 500 mg via INTRAVENOUS
  Filled 2015-06-02 (×8): qty 500

## 2015-06-02 MED ORDER — KCL IN DEXTROSE-NACL 20-5-0.45 MEQ/L-%-% IV SOLN
INTRAVENOUS | Status: DC
  Administered 2015-06-02 – 2015-06-05 (×6): via INTRAVENOUS
  Filled 2015-06-02 (×12): qty 1000

## 2015-06-02 MED ORDER — TRAMADOL HCL 50 MG PO TABS
50.0000 mg | ORAL_TABLET | Freq: Three times a day (TID) | ORAL | Status: DC | PRN
  Administered 2015-06-03: 50 mg via ORAL
  Filled 2015-06-02 (×2): qty 1

## 2015-06-02 MED ORDER — SERTRALINE HCL 100 MG PO TABS
100.0000 mg | ORAL_TABLET | Freq: Every day | ORAL | Status: DC
  Administered 2015-06-06: 100 mg via ORAL
  Filled 2015-06-02 (×2): qty 1

## 2015-06-02 MED ORDER — BUDESONIDE 0.25 MG/2ML IN SUSP
0.2500 mg | Freq: Two times a day (BID) | RESPIRATORY_TRACT | Status: DC
  Administered 2015-06-02 – 2015-06-08 (×12): 0.25 mg via RESPIRATORY_TRACT
  Filled 2015-06-02 (×12): qty 2

## 2015-06-02 MED ORDER — SODIUM CHLORIDE 0.9 % IV SOLN
INTRAVENOUS | Status: AC
  Administered 2015-06-02: 75 mL/h via INTRAVENOUS

## 2015-06-02 MED ORDER — SODIUM CHLORIDE 0.9 % IV BOLUS (SEPSIS)
1000.0000 mL | INTRAVENOUS | Status: AC
  Administered 2015-06-02 (×2): 1000 mL via INTRAVENOUS

## 2015-06-02 MED ORDER — GATIFLOXACIN 0.5 % OP SOLN
1.0000 [drp] | Freq: Three times a day (TID) | OPHTHALMIC | Status: DC
  Administered 2015-06-02 – 2015-06-08 (×19): 1 [drp] via OPHTHALMIC
  Filled 2015-06-02 (×2): qty 2.5

## 2015-06-02 MED ORDER — MEMANTINE HCL 10 MG PO TABS
10.0000 mg | ORAL_TABLET | Freq: Two times a day (BID) | ORAL | Status: DC
  Administered 2015-06-03 – 2015-06-06 (×4): 10 mg via ORAL
  Filled 2015-06-02 (×12): qty 1

## 2015-06-02 MED ORDER — DEXTROSE 5 % IV SOLN
1.0000 g | Freq: Once | INTRAVENOUS | Status: AC
  Administered 2015-06-02: 1 g via INTRAVENOUS
  Filled 2015-06-02: qty 10

## 2015-06-02 MED ORDER — METHYLPREDNISOLONE SODIUM SUCC 125 MG IJ SOLR
60.0000 mg | Freq: Once | INTRAMUSCULAR | Status: AC
  Administered 2015-06-02: 60 mg via INTRAVENOUS
  Filled 2015-06-02: qty 2

## 2015-06-02 MED ORDER — ACETAMINOPHEN 500 MG PO TABS
1000.0000 mg | ORAL_TABLET | Freq: Once | ORAL | Status: AC
  Administered 2015-06-02: 1000 mg via ORAL
  Filled 2015-06-02: qty 2

## 2015-06-02 MED ORDER — LORATADINE 10 MG PO TABS
10.0000 mg | ORAL_TABLET | Freq: Every day | ORAL | Status: DC
  Filled 2015-06-02: qty 1

## 2015-06-02 MED ORDER — CETYLPYRIDINIUM CHLORIDE 0.05 % MT LIQD
7.0000 mL | Freq: Two times a day (BID) | OROMUCOSAL | Status: DC
  Administered 2015-06-02 – 2015-06-08 (×12): 7 mL via OROMUCOSAL

## 2015-06-02 MED ORDER — ENOXAPARIN SODIUM 40 MG/0.4ML ~~LOC~~ SOLN
40.0000 mg | Freq: Every day | SUBCUTANEOUS | Status: DC
  Administered 2015-06-02 – 2015-06-06 (×5): 40 mg via SUBCUTANEOUS
  Filled 2015-06-02 (×5): qty 0.4

## 2015-06-02 MED ORDER — AZITHROMYCIN 500 MG IV SOLR
500.0000 mg | Freq: Once | INTRAVENOUS | Status: AC
  Administered 2015-06-02: 500 mg via INTRAVENOUS
  Filled 2015-06-02: qty 500

## 2015-06-02 MED ORDER — CHLORHEXIDINE GLUCONATE 0.12 % MT SOLN
15.0000 mL | Freq: Two times a day (BID) | OROMUCOSAL | Status: DC
  Administered 2015-06-02 – 2015-06-06 (×7): 15 mL via OROMUCOSAL
  Filled 2015-06-02 (×7): qty 15

## 2015-06-02 MED ORDER — GUAIFENESIN ER 600 MG PO TB12
1200.0000 mg | ORAL_TABLET | Freq: Two times a day (BID) | ORAL | Status: DC
  Administered 2015-06-04: 1200 mg via ORAL
  Filled 2015-06-02 (×2): qty 2

## 2015-06-02 MED ORDER — LEVALBUTEROL HCL 0.63 MG/3ML IN NEBU
0.6300 mg | INHALATION_SOLUTION | RESPIRATORY_TRACT | Status: DC | PRN
  Administered 2015-06-03 – 2015-06-07 (×3): 0.63 mg via RESPIRATORY_TRACT
  Filled 2015-06-02 (×3): qty 3

## 2015-06-02 MED ORDER — CEFTRIAXONE SODIUM 1 G IJ SOLR
1.0000 g | INTRAMUSCULAR | Status: DC
  Administered 2015-06-02 – 2015-06-07 (×6): 1 g via INTRAVENOUS
  Filled 2015-06-02 (×7): qty 10

## 2015-06-02 MED ORDER — PANTOPRAZOLE SODIUM 40 MG PO TBEC
40.0000 mg | DELAYED_RELEASE_TABLET | Freq: Every day | ORAL | Status: DC
  Filled 2015-06-02: qty 1

## 2015-06-02 MED ORDER — ARFORMOTEROL TARTRATE 15 MCG/2ML IN NEBU
15.0000 ug | INHALATION_SOLUTION | Freq: Two times a day (BID) | RESPIRATORY_TRACT | Status: DC
  Administered 2015-06-02 – 2015-06-08 (×12): 15 ug via RESPIRATORY_TRACT
  Filled 2015-06-02 (×12): qty 2

## 2015-06-02 MED ORDER — IPRATROPIUM BROMIDE 0.02 % IN SOLN
0.5000 mg | Freq: Four times a day (QID) | RESPIRATORY_TRACT | Status: DC
  Administered 2015-06-02: 0.5 mg via RESPIRATORY_TRACT
  Filled 2015-06-02: qty 2.5

## 2015-06-02 NOTE — Evaluation (Signed)
Clinical/Bedside Swallow Evaluation Patient Details  Name: Jerry Kerr MRN: [id number] Date of Birth: June 02, 1933  Today's Date: 06/02/2015 Time: SLP Start Time (ACUTE ONLY): 1010 SLP Stop Time (ACUTE ONLY): 1025 SLP Time Calculation (min) (ACUTE ONLY): 15 min  Past Medical History:  Past Medical History  Diagnosis Date  . Asthma   . Dementia   . Back pain   . COPD (chronic obstructive pulmonary disease) ([place])   . Hypertension   . Pacemaker   . Anxiety   . Depression   . Cancer (HCC)     SKIN, PROSTATE  . GERD (gastroesophageal reflux disease)   . Substance abuse     ETOH  . Dysphagia, unspecified(787.20) 12/12/2013  . Swallowing difficulty     SEES  DR. PYRTLE HAS HX ESOPHAGEAL DIL  . Barrett's esophagus   . CHF (congestive heart failure) (HCC)     chronic diastolic HF  . Dysrhythmia     atrial fibrillation   Past Surgical History:  Past Surgical History  Procedure Laterality Date  . Neck surgery    . Upper gastrointestinal endoscopy    . Colonoscopy    . Ankle surgery    . Skin graft    . Pacemaker insertion    . Tonsillectomy    . Esophageal dilation    . Kyphoplasty Bilateral 05/05/2014    Procedure: KYPHOPLASTY LUMBAR TWO;  Surgeon: Consuella Lose, MD;  Location: [place] [facility];  Service: Neurosurgery;  Laterality: Bilateral;   HPI:  With history of Alzheimer's dementia, COPD, hypertension, status post permanent pacemaker, depression/anxiety, gastroesophageal reflux disease, chronic diastolic heart failure off diuretics will approximate 6 months per family, history of atrial fibrillation presented to the ED with worsening shortness of breath, worsening confusion, decreased oral intake, generalized weakness to the point whereby patient is no longer ambulating used to ambulate with the aid of a walker. Patient lives in an apartment with 24-hour care. Patient with a history of dementia and a such history was provided by patient's daughter and wife who are at bedside.    Assessment / Plan / Recommendation Clinical Impression  Pt lethargic, but arouses to verbal cues. Pt exhibits moderate oral/pharyngeal dysphagia. He had poor oral bolus control with right labial spillage and suspect posterior spillage as well across all consistencies. Pt had a delayed throat clear and/or cough with all consistencies as well. MBSS recommended. RN notified.    Aspiration Risk  Moderate aspiration risk    Diet Recommendation NPO        Other  Recommendations     Follow up Recommendations   MBSS            Swallow Study   General Date of Onset: 06/02/15 HPI: With history of Alzheimer's dementia, COPD, hypertension, status post permanent pacemaker, depression/anxiety, gastroesophageal reflux disease, chronic diastolic heart failure off diuretics will approximate 6 months per family, history of atrial fibrillation presented to the ED with worsening shortness of breath, worsening confusion, decreased oral intake, generalized weakness to the point whereby patient is no longer ambulating used to ambulate with the aid of a walker. Patient lives in an apartment with 24-hour care. Patient with a history of dementia and a such history was provided by patient's daughter and wife who are at bedside. Type of Study: Bedside Swallow Evaluation Previous Swallow Assessment: Last swallow assessment 11/2015 Diet Prior to this Study: NPO Temperature Spikes Noted: Yes Respiratory Status: Nasal cannula History of Recent Intubation: No Oral Cavity Assessment: Within Functional Limits Oral Care Completed  by SLP: No Oral Cavity - Dentition: Missing dentition Vision: Functional for self-feeding Self-Feeding Abilities: Needs assist Patient Positioning: Upright in bed Baseline Vocal Quality: Wet Volitional Cough: Weak;Congested Volitional Swallow: Able to elicit    Oral/Motor/Sensory Function Overall Oral Motor/Sensory Function: Within functional limits   Ice Chips Ice chips:  Impaired Presentation: Spoon Oral Phase Impairments: Reduced lingual movement/coordination Oral Phase Functional Implications: Left anterior spillage Pharyngeal Phase Impairments: Suspected delayed Swallow   Thin Liquid Thin Liquid: Impaired Presentation: Spoon Oral Phase Impairments: Reduced labial seal;Poor awareness of bolus Oral Phase Functional Implications: Prolonged oral transit Pharyngeal  Phase Impairments: Suspected delayed Swallow;Cough - Delayed;Throat Clearing - Delayed    Nectar Thick     Honey Thick     Puree Puree: Impaired Presentation: Spoon Oral Phase Impairments: Reduced labial seal;Reduced lingual movement/coordination;Poor awareness of bolus Oral Phase Functional Implications: Prolonged oral transit Pharyngeal Phase Impairments: Throat Clearing - Delayed;Cough - Delayed   Solid   GO   Solid: Not tested       Charlynne Cousins Iretha Kirley, MA, CCC-SLP 06/02/2015 10:32 AM

## 2015-06-02 NOTE — Progress Notes (Signed)
MBS completed. Full report in the imaging section.

## 2015-06-02 NOTE — ED Notes (Signed)
Pt's daughter Priscille Kluver, [phone]

## 2015-06-02 NOTE — Progress Notes (Signed)
Initial Nutrition Assessment  DOCUMENTATION CODES:   Severe malnutrition in context of acute illness/injury  INTERVENTION:  RD to order nutritional supplements if/when diet advances.  If enteral nutrition is warranted, recommend Jevity 1.2 @ 25 ml/hr via tube and increase by 10 ml every 4 hours to goal rate of 65 ml/hr to provide 1872 kcal (100% of needs), 87 grams of protein, and 1264 ml of H2O.   RD to continue to monitor.   NUTRITION DIAGNOSIS:   Malnutrition related to acute illness as evidenced by energy intake < or equal to 50% for > or equal to 5 days, moderate depletions of muscle mass, moderate depletion of body fat.  GOAL:   Patient will meet greater than or equal to 90% of their needs  MONITOR:   Diet advancement, Weight trends, Labs, I & O's  REASON FOR ASSESSMENT:   Malnutrition Screening Tool    ASSESSMENT:   With history of Alzheimer's dementia, COPD, hypertension, status post permanent pacemaker, depression/anxiety, gastroesophageal reflux disease, chronic diastolic heart failure off diuretics will approximate 6 months per family, history of atrial fibrillation presented to the ED with worsening shortness of breath, worsening confusion, decreased oral intake, generalized weakness.  Per SLP evaluation, pt with severe aspiration risk and recommends NPO. Pt was lethargic and did not wake up at time of visit. Family at bedside reports intake PTA has been "so so". She reports pt mainly has been consuming sweets as it has only what pt is willing to consume. Pt with weight loss. Per Epic weight records, pt with a 9.1% weight loss in 4 months. Enteral nutrition recommendations have been stated above if pt unable to take po in near future when pt more alert. RD to continue to monitor.   Nutrition-Focused physical exam completed. Findings are moderate fat depletion, mild to moderate muscle depletion, and no edema.   Labs and medications reviewed.   Diet Order:  Diet NPO  time specified  Skin:  Reviewed, no issues  Last BM:  Unknown  Height:   Ht Readings from Last 1 Encounters:  06/01/15 5\' 9"  (1.753 m)    Weight:   Wt Readings from Last 1 Encounters:  06/01/15 140 lb (63.504 kg)    Ideal Body Weight:  72.7 kg  BMI:  Body mass index is 20.67 kg/(m^2).  Estimated Nutritional Needs:   Kcal:  1800-2000  Protein:  85-95 grams  Fluid:  1.8 - 2 L/day  EDUCATION NEEDS:   No education needs identified at this time  Corrin Parker, MS, RD, LDN Pager # [phone] After hours/ weekend pager # [phone]

## 2015-06-02 NOTE — Progress Notes (Signed)
I stopped by Jerry Kerr room this evening. He was sleeping and caregiver was present in room. She reports that he has been feeling better this afternoon. His daughter, Jerry Kerr, is his HCPOA.  She is out of town at this time, but will be returning tomorrow. I will follow-up with Jerry Kerr and his family tomorrow.  Please call if we can be of any assistance in the interim.  Micheline Rough, MD [place] Team [phone]

## 2015-06-02 NOTE — Evaluation (Signed)
Physical Therapy Evaluation Patient Details Name: Virgle Shortell MRN: [id number] DOB: Aug 18, 1933 Today's Date: 06/02/2015   History of Present Illness  82 y.o. male history of Alzheimer's dementia, COPD, hypertension, status post permanent pacemaker, depression/anxiety, gastroesophageal reflux disease, chronic diastolic heart failure. Admitted for community acquired pneumonia, systemic inflammatory response syndrome, COPD, dehydration, and acute encephalopathy.  Clinical Impression  Pt admitted with above complications. Pt currently with functional limitations due to the deficits listed below (see PT Problem List). Wife reports cognition back to baseline. Required min assist for majority of mobility assessed today. Short distance ambulation, SpO2 93% on room air. Family and caregiver report pt was fairly active PTA, going out to lunch daily with wife. He did require assistance with ADLs but could ambulate with a rollator. Caregiver reports he has had multiple falls at home but often gets himself back up. Patient would benefit from SNF to improve strength, balance, and safety with independence prior to returning home with 24/7 supervision. Will follow and progress while admitted.       Follow Up Recommendations SNF    Equipment Recommendations  None recommended by PT    Recommendations for Other Services       Precautions / Restrictions Precautions Precautions: Fall Restrictions Weight Bearing Restrictions: No      Mobility  Bed Mobility Overal bed mobility: Needs Assistance Bed Mobility: Supine to Sit;Sit to Supine     Supine to sit: Min assist Sit to supine: Supervision   General bed mobility comments: Min assist for patient to bring LEs off of bed and patient to pull through PTs hand into seated position. Cues for sequencing throughout.  Transfers Overall transfer level: Needs assistance Equipment used: Rolling walker (2 wheeled) Transfers: Sit to/from Stand Sit to Stand:  Min assist         General transfer comment: Min assist for boost to stand. VC for hand placement. Facilatatory cues to rise. Posterior lean  Ambulation/Gait Ambulation/Gait assistance: Min assist Ambulation Distance (Feet): 25 Feet Assistive device: Rolling walker (2 wheeled) Gait Pattern/deviations: Step-through pattern;Decreased stride length;Shuffle;Trunk flexed Gait velocity: slow Gait velocity interpretation: <1.8 ft/sec, indicative of risk for recurrent falls General Gait Details: VC for upright posture and to open eyes. Shuffled gait, with min assist for walker control and cues to facilitate continued gait forward. Guidance for turns when approaching bed.  Stairs            Wheelchair Mobility    Modified Rankin (Stroke Patients Only)       Balance Overall balance assessment: Needs assistance;History of Falls Sitting-balance support: No upper extremity supported;Feet supported Sitting balance-Leahy Scale: Good     Standing balance support: Single extremity supported Standing balance-Leahy Scale: Poor                               Pertinent Vitals/Pain Pain Assessment: No/denies pain    Home Living Family/patient expects to be discharged to:: Skilled nursing facility Living Arrangements: Other (Comment) (With caregiver) Available Help at Discharge: Personal care attendant;Available 24 hours/day Type of Home: Apartment Home Access: Level entry     Home Layout: One level Home Equipment: Walker - 4 wheels;Shower seat;Wheelchair - manual      Prior Function Level of Independence: Needs assistance   Gait / Transfers Assistance Needed: Prior to decline which began about a week ago, pt was ambulatory with a rollator, going out to lunch daily with wife.  ADL's / Homemaking Assistance Needed:  Needed assist with bath/dressing  Comments: Information provided from wife.     Hand Dominance        Extremity/Trunk Assessment   Upper Extremity  Assessment: Defer to OT evaluation           Lower Extremity Assessment: Generalized weakness;Difficult to assess due to impaired cognition         Communication   Communication: No difficulties  Cognition Arousal/Alertness: Lethargic Behavior During Therapy: WFL for tasks assessed/performed Overall Cognitive Status: History of cognitive impairments - at baseline (per wife,  pt at baseline)                      General Comments General comments (skin integrity, edema, etc.): SpO2 95% on room air, supplemental O2 reapplied at end of session. Pt with frequent couging. HOB to 30 degrees.Wife reports multiple falls at home.    Exercises        Assessment/Plan    PT Assessment Patient needs continued PT services  PT Diagnosis Difficulty walking;Abnormality of gait;Generalized weakness   PT Problem List Decreased strength;Decreased range of motion;Decreased activity tolerance;Decreased balance;Decreased mobility;Decreased coordination;Decreased cognition;Decreased knowledge of use of DME;Decreased safety awareness;Cardiopulmonary status limiting activity  PT Treatment Interventions DME instruction;Gait training;Functional mobility training;Therapeutic activities;Therapeutic exercise;Balance training;Neuromuscular re-education;Patient/family education;Cognitive remediation   PT Goals (Current goals can be found in the Care Plan section) Acute Rehab PT Goals Patient Stated Goal: none stated PT Goal Formulation: With family Time For Goal Achievement: 06/16/15 Potential to Achieve Goals: Fair    Frequency Min 2X/week   Barriers to discharge        Co-evaluation               End of Session Equipment Utilized During Treatment: Gait belt Activity Tolerance: Patient limited by lethargy Patient left: in bed;with call bell/phone within reach;with bed alarm set;with family/visitor present Nurse Communication: Mobility status         Time: [id number] PT Time  Calculation (min) (ACUTE ONLY): 21 min   Charges:   PT Evaluation $PT Eval Moderate Complexity: 1 Procedure     PT G CodesEllouise Newer 06/02/2015, 4:35 PM  Camille Bal [place], [place]

## 2015-06-02 NOTE — Progress Notes (Signed)
Patient seen and evaluated earlier the same by my associate. Please refer to H&P for details regarding assessment and plan.  Influenza negative such Tamiflu discontinued  Speech therapy recommended patient be nothing by mouth we'll place on maintenance IV fluid  Continue supportive therapy  Gen.: Patient in no acute distress somnolent but arousable Cardiovascular: S1 and S2 within normal limits no rubs Pulmonary: No wheezes, equal chest rise  We'll reassess next a.m.  Bryonna Sundby, [employer]

## 2015-06-02 NOTE — H&P (Signed)
[facility] History and Physical  Jerry Kerr [id number] DOB: Jul 09, 1933 DOA: 06/01/2015  Referring physician: Dr. Cathleen Fears PCP: [name], MD   Chief Complaint: Shortness of breath/worsening confusion  HPI: Jerry Kerr is a 82 y.o. male  With history of Alzheimer's dementia, COPD, hypertension, status post permanent pacemaker, depression/anxiety, gastroesophageal reflux disease, chronic diastolic heart failure off diuretics will approximate 6 months per family, history of atrial fibrillation presented to the ED with worsening shortness of breath, worsening confusion, decreased oral intake, generalized weakness to the point whereby patient is no longer ambulating used to ambulate with the aid of a walker. Patient lives in an apartment with 24-hour care. Patient with a history of dementia and a such history was provided by patient's daughter and wife who are at bedside. Per family patient over the past 2 weeks has been getting progressively confused with some worsening shortness of breath, productive cough, fever noted in the emergency room with a temperature 101.4, wheezing, worsening weakness. Patient denies any chills, no nausea, no vomiting, no abdominal pain, no diarrhea, no constipation. No melena, no hematemesis, no hematochezia. Per Family patient has been falling more frequently to the point where he sprained his ankle. Patient with poor oral intake with worsening weakness and worsening deconditioning. Per daughter patient refused to take his diuretics over the past 6 months and also stopped taking his potassium pills. Per daughter patient noncompliant with a cardiac diet. Patient was seen in the emergency room temperature 101.4 with a respiratory rate as high as 30. Comprehensive metabolic profile obtained with a chloride of 99 albumin of 3.3 otherwise within normal limits. BNP was 231.1. Lactic acid level was within normal limits. CBC had a hemoglobin of 11.8 otherwise  was within normal limits. EKG with normal sinus rhythm with nonspecific T-wave changes. Patient was given IV Rocephin IV azithromycin empirically for clinical pneumonia. [facility] was called to admit the patient for further evaluation and management.   Review of Systems: As per history of present illness otherwise negative. Constitutional:  No weight loss, night sweats, Fevers, chills, fatigue.  HEENT:  No headaches, Difficulty swallowing,Tooth/dental problems,Sore throat,  No sneezing, itching, ear ache, nasal congestion, post nasal drip,  Cardio-vascular:  No chest pain, Orthopnea, PND, swelling in lower extremities, anasarca, dizziness, palpitations  GI:  No heartburn, indigestion, abdominal pain, nausea, vomiting, diarrhea, change in bowel habits, loss of appetite  Resp:  No shortness of breath with exertion or at rest. No excess mucus, no productive cough, No non-productive cough, No coughing up of blood.No change in color of mucus.No wheezing.No chest wall deformity  Skin:  no rash or lesions.  GU:  no dysuria, change in color of urine, no urgency or frequency. No flank pain.  Musculoskeletal:  No joint pain or swelling. No decreased range of motion. No back pain.  Psych:  No change in mood or affect. No depression or anxiety. No memory loss.   Past Medical History  Diagnosis Date  . Asthma   . Dementia   . Back pain   . COPD (chronic obstructive pulmonary disease) ([place])   . Hypertension   . Pacemaker   . Anxiety   . Depression   . Cancer (HCC)     SKIN, PROSTATE  . GERD (gastroesophageal reflux disease)   . Substance abuse     ETOH  . Dysphagia, unspecified(787.20) 12/12/2013  . Swallowing difficulty     SEES  DR. PYRTLE HAS HX ESOPHAGEAL DIL  . Barrett's esophagus   . CHF (  congestive heart failure) (HCC)     chronic diastolic HF  . Dysrhythmia     atrial fibrillation   Past Surgical History  Procedure Laterality Date  . Neck surgery    . Upper  gastrointestinal endoscopy    . Colonoscopy    . Ankle surgery    . Skin graft    . Pacemaker insertion    . Tonsillectomy    . Esophageal dilation    . Kyphoplasty Bilateral 05/05/2014    Procedure: KYPHOPLASTY LUMBAR TWO;  Surgeon: Consuella Lose, MD;  Location: [facility];  Service: Neurosurgery;  Laterality: Bilateral;   Social History:  reports that he has quit smoking. He has never used smokeless tobacco. He reports that he drinks alcohol. He reports that he does not use illicit drugs.   Allergies  Allergen Reactions  . Hydrocodone Other (See Comments)    Confusion. agitation  . Other     Family History  Problem Relation Age of Onset  . CAD Father   . Heart disease Father   . Hypertension Mother   . AAA (abdominal aortic aneurysm) Mother   . Heart disease Mother   . Dementia Neg Hx    Father deceased from old age at age 63. Mother deceased from what family describes as a AAA rupture.   Prior to Admission medications   Medication Sig Start Date End Date Taking? Authorizing Provider  albuterol (PROVENTIL HFA;VENTOLIN HFA) 108 (90 BASE) MCG/ACT inhaler Inhale into the lungs every 6 (six) hours as needed for wheezing or shortness of breath.    Historical Provider, MD  albuterol (PROVENTIL) (2.5 MG/3ML) 0.083% nebulizer solution Take 2.5 mg by nebulization every 6 (six) hours as needed for wheezing or shortness of breath.    Historical Provider, MD  aspirin EC 81 MG tablet Take 81 mg by mouth daily.    Historical Provider, MD  divalproex (DEPAKOTE SPRINKLE) 125 MG capsule Take 500 mg by mouth 2 (two) times daily.    Historical Provider, MD  memantine (NAMENDA) 10 MG tablet Take 10 mg by mouth 2 (two) times daily.    Historical Provider, MD  moxifloxacin (VIGAMOX) 0.5 % ophthalmic solution Place 1 drop into the right eye 3 (three) times daily. 05/23/15   Billy Fischer, MD  omeprazole (PRILOSEC) 20 MG capsule TAKE TWO   CAPSULES (40 MG TOTAL) BY MOUTH TWO   TIMES DAILY 04/19/15    Jerene Bears, MD  QUEtiapine (SEROQUEL) 100 MG tablet Take 1 tablet (100 mg total) by mouth at bedtime. 12/16/13   Orson Eva, MD  sertraline (ZOLOFT) 100 MG tablet Take 100 mg by mouth daily.    Historical Provider, MD  sertraline (ZOLOFT) 50 MG tablet Take 100 mg by mouth daily.     Historical Provider, MD  traMADol (ULTRAM) 50 MG tablet Take 50 mg by mouth every 8 (eight) hours as needed (back pain).  02/06/15   Historical Provider, MD   Physical Exam: Filed Vitals:   06/01/15 2315 06/01/15 2345 06/02/15 0000 06/02/15 0003  BP: 165/82 162/92 174/92 174/92  Pulse: 73 72 71 88  Temp:    101.4 F (38.6 C)  TempSrc:    Rectal  Resp: 22 23 23 24   Height:      Weight:      SpO2: 96% 94% 96% 92%    Wt Readings from Last 3 Encounters:  06/01/15 63.504 kg (140 lb)  05/21/15 63.504 kg (140 lb)  02/13/15 70.024 kg (154 lb 6  oz)    General:  Frail ulnarly gentleman laying on gurney in no acute cardiopulmonary distress. Somewhat unkempt.  Eyes: PERRLA, EOMI, normal lids, irises & conjunctiva ENT: grossly normal hearing, lips & tongue. Poor dentition. Extremely dry mucous membranes. Neck: no LAD, masses or thyromegaly Cardiovascular: Regular rate and rhythm, no m/r/g. No LE edema. Respiratory: Diffuse coarse rhonchorous breath sounds. Expiratory wheezing. No crackles noted.  Abdomen: soft, ntnd, positive bowel sounds, no rebound, no guarding Skin: no rash or induration seen on limited exam Musculoskeletal: grossly normal tone BUE/BLE Psychiatric: grossly normal mood and affect, speech fluent and appropriate Neurologic: Alert to self and place. Patient states since February and 2016. Patient knows the president is. Cranial nerves II through XII grossly intact. No focal deficits.           Labs on Admission:  Basic Metabolic Panel:  Recent Labs Lab 06/01/15 1845  NA 140  K 4.7  CL 99*  CO2 25  GLUCOSE 89  BUN 12  CREATININE 1.09  CALCIUM 9.7   Liver Function Tests:  Recent  Labs Lab 06/01/15 1845  AST 27  ALT 9*  ALKPHOS 119  BILITOT 0.7  PROT 7.1  ALBUMIN 3.3*   No results for input(s): LIPASE, AMYLASE in the last 168 hours. No results for input(s): AMMONIA in the last 168 hours. CBC:  Recent Labs Lab 06/01/15 2110  WBC 6.4  NEUTROABS 4.2  HGB 11.8*  HCT 39.0  MCV 89.7  PLT 153   Cardiac Enzymes: No results for input(s): CKTOTAL, CKMB, CKMBINDEX, TROPONINI in the last 168 hours.  BNP (last 3 results)  Recent Labs  06/01/15 2241  BNP 231.1*    ProBNP (last 3 results)  Recent Labs  07/07/14 1025  PROBNP 274.0*    CBG:  Recent Labs Lab 06/01/15 1837  GLUCAP 73    Radiological Exams on Admission: Dg Chest 1 View  06/01/2015  CLINICAL DATA:  Confusion, weakness, shaking, shortness of breath recently, history cancer, asthma, CHF, hypertension, COPD, dementia EXAM: CHEST 1 VIEW COMPARISON:  12/13/2013 FINDINGS: Kyphotic positioning. LEFT subclavian pacemaker leads project over RIGHT atrium and RIGHT ventricle. Stable heart size and mediastinal contours. Minimal bibasilar atelectasis. Probable underlying emphysematous changes. No definite infiltrate, pleural effusion or gross pneumothorax, though portions of the apices are obscured by the patient's head. IMPRESSION: Probable COPD changes with minimal bibasilar atelectasis. Electronically Signed   By: Lavonia Dana M.D.   On: 06/01/2015 19:05   Ct Head Wo Contrast  06/01/2015  CLINICAL DATA:  Altered mental status.  History of dementia. EXAM: CT HEAD WITHOUT CONTRAST TECHNIQUE: Contiguous axial images were obtained from the base of the skull through the vertex without intravenous contrast. COMPARISON:  02/14/2015 FINDINGS: Sinuses/Soft tissues: Minimal mucosal thickening of bilateral maxillary sinuses and the left sphenoid sinus. Hypoplastic frontal sinuses. Clear mastoid air cells. Intracranial: Mild low density in the periventricular white matter likely related to small vessel disease.  Cerebral and cerebellar atrophy. No mass lesion, hemorrhage, hydrocephalus, acute infarct, intra-axial, or extra-axial fluid collection. IMPRESSION: 1.  No acute intracranial abnormality. 2.  Cerebral atrophy and small vessel ischemic change. 3. Sinus disease. Electronically Signed   By: Abigail Miyamoto M.D.   On: 06/01/2015 23:44    EKG: Independently reviewed. Normal sinus rhythm. Nonspecific T-wave changes.  Assessment/Plan Principal Problem:   CAP (community acquired pneumonia) Active Problems:   SIRS (systemic inflammatory response syndrome) (HCC)   Dehydration   Fall down steps   COPD (chronic obstructive pulmonary disease) (  [place])   Hypertension   GERD (gastroesophageal reflux disease)   Dementia   Acute encephalopathy   Fall   Atrial fibrillation ([place])   Alzheimer's dementia with behavioral disturbance   HLD (hyperlipidemia)   Chronic diastolic CHF (congestive heart failure) ([place])   H/O personality disorder   FTT (failure to thrive) in adult  #1 community acquired pneumonia Patient presented with fever, productive cough, physical exam with diffuse coarse rhonchorous breath sounds and were some for clinical pneumonia. Will admit the patient to telemetry. Check an influenza panel. Check a respiratory viral panel. Check a sputum Gram stain and culture. Check a urine Legionella antigen. Check urine pneumococcus antigen. Check blood cultures 2. Place on oxygen, nebulizers, IV Rocephin, IV azithromycin, Claritin, Pulmicort, Brovana. Chest PT. Follow.  #2 systemic inflammatory response syndrome Patient meeting criteria for systemic inflammatory response syndrome secondary to fever and tachypnea. Questionable etiology may be secondary to a community-acquired pneumonia. Will panculture. Placed empirically on IV Rocephin and IV azithromycin. Follow.  #3 COPD Stable. Patient with some wheezing on examination. Place on oxygen, nebulizer treatments, Pulmicort, Brovana, Claritin, Flonase. Will  give a dose of IV Solu-Medrol. Follow.  #4 dehydration Gentle hydration.  #5 acute encephalopathy Patient alert to self and place. HEENT his 2016 and needs February. Patient following commands appropriately. Patient states the president is Trump. Family states mentation improving. Likely a metabolic encephalopathy secondary to probable community-acquired pneumonia. Will panculture the patient. Placed on empiric antibiotics, gentle hydration, follow.  #6 chronic diastolic heart failure Stable. Per family patient hasn't been taking his diuretics in over 6 months. Patient seems compensated on examination. Continue aspirin. Follow.  #7 failure to thrive/Alzheimer's dementia Patient with poor oral intake and declining per family. Will panculture to rule out infectious etiologies. Treat empirically with IV antibiotics for clinical pneumonia. Continue home regimen of Depakote sprinkles, Namenda, Seroquel, Zoloft. Will also consulted palliative care for goals of care.  #8 atrial fibrillation Currently rate controlled. Per family patient refused to take most of his medications. We'll monitor for now. If better heart rate control is needed his dad low-dose beta blocker. Due to patient's frequent falls and history of dementia continue aspirin.   #9 gastroesophageal reflux disease PPI.  #10 prophylaxis PPI for GI prophylaxis. Lovenox for DVT prophylaxis.   Code Status: Full DVT Prophylaxis: Lovenox. Family Communication: Updated wife and daughter at bedside. Disposition Plan: Admit to telemetry.  Time spent: 57 minutes  THOMPSON,DANIEL M.D. [facility] Pager [phone]

## 2015-06-03 LAB — URINE CULTURE: Culture: NO GROWTH

## 2015-06-03 LAB — GLUCOSE, CAPILLARY: Glucose-Capillary: 91 mg/dL (ref 65–99)

## 2015-06-03 MED ORDER — MUPIROCIN 2 % EX OINT
1.0000 "application " | TOPICAL_OINTMENT | Freq: Two times a day (BID) | CUTANEOUS | Status: AC
  Administered 2015-06-03 – 2015-06-07 (×10): 1 via NASAL
  Filled 2015-06-03: qty 22

## 2015-06-03 MED ORDER — CHLORHEXIDINE GLUCONATE CLOTH 2 % EX PADS
6.0000 | MEDICATED_PAD | Freq: Every day | CUTANEOUS | Status: AC
  Administered 2015-06-03 – 2015-06-07 (×3): 6 via TOPICAL

## 2015-06-03 NOTE — Evaluation (Signed)
Occupational Therapy Evaluation Patient Details Name: Jerry Kerr MRN: [id number] DOB: 26-Jan-1934 Today's Date: 06/03/2015    History of Present Illness 82 y.o. male history of Alzheimer's dementia, Afib, back pain, cancer, GERD, sustance abuse, dysphagia, COPD, hypertension, status post permanent pacemaker, depression/anxiety, gastroesophageal reflux disease, chronic diastolic heart failure. Admitted for confusion, weakness, SOB.   Clinical Impression   Pt admitted with above. Feel pt may benefit from acute OT to increase independence prior to d/c. Recommending SNF upon d/c.    Follow Up Recommendations  SNF;Supervision/Assistance - 24 hour    Equipment Recommendations  Other (comment) (defer to next venue)    Recommendations for Other Services       Precautions / Restrictions Precautions Precautions: Fall Restrictions Weight Bearing Restrictions: No      Mobility Bed Mobility Overal bed mobility: Needs Assistance Bed Mobility: Sit to Sidelying;Sidelying to Sit   Sidelying to sit: Max assist (see comments below)     Sit to sidelying: Supervision General bed mobility comments: assist with trunk and LEs to come to sitting position. later in session, HOB elevated and pt able to sit himself up without physical assist.  Transfers Overall transfer level: Needs assistance   Transfers: Sit to/from Stand Sit to Stand: Mod assist              Balance    Assist for standing balance. Once in a good position on EOB, pt able to maintain sitting balance.                                        ADL Overall ADL's : Needs assistance/impaired                 Upper Body Dressing : Moderate assistance;Sitting   Lower Body Dressing: Total assistance;Sit to/from stand Lower Body Dressing Details (indicate cue type and reason): pt would not doff socks when OT asked him Toilet Transfer: Moderate assistance (sit to stand from bed)                    Vision     Perception     Praxis      Pertinent Vitals/Pain Pain Assessment: Faces Faces Pain Scale: Hurts little more Pain Location: all over Pain Intervention(s): Monitored during session     Hand Dominance     Extremity/Trunk Assessment Upper Extremity Assessment Upper Extremity Assessment: Generalized weakness;Difficult to assess due to impaired cognition           Communication Communication Communication: No difficulties   Cognition Arousal/Alertness: Awake/alert Behavior During Therapy: WFL for tasks assessed/performed Overall Cognitive Status: History of cognitive impairments - at baseline                     General Comments       Exercises       Shoulder Instructions      Home Living Family/patient expects to be discharged to:: Unsure Living Arrangements:  (caregivers) Available Help at Discharge: Personal care attendant;Available 24 hours/day Type of Home: Apartment Home Access: Level entry     Home Layout: One level               Home Equipment: Walker - 4 wheels;Shower seat;Wheelchair - manual          Prior Functioning/Environment Level of Independence: Needs assistance  Gait / Transfers Assistance Needed: Prior to decline which began  about a week ago, pt was ambulatory with a rollator, going out to lunch daily with wife. ADL's / Homemaking Assistance Needed: Needed assist with bathing/dressing        OT Diagnosis: Generalized weakness   OT Problem List: Decreased strength;Decreased activity tolerance;Impaired balance (sitting and/or standing);Decreased cognition;Decreased knowledge of use of DME or AE;Decreased knowledge of precautions;Pain   OT Treatment/Interventions: DME and/or AE instruction;Therapeutic activities;Cognitive remediation/compensation;Patient/family education;Balance training;Self-care/ADL training;Therapeutic exercise    OT Goals(Current goals can be found in the care plan section) Acute Rehab  OT Goals Patient Stated Goal: daughter reports pt wants independence OT Goal Formulation: With family Time For Goal Achievement: 06/10/15 Potential to Achieve Goals: Fair ADL Goals Pt Will Perform Upper Body Bathing: sitting;with min assist Pt Will Perform Upper Body Dressing: with set-up;with supervision;sitting Pt Will Transfer to Toilet: with supervision;with set-up;ambulating;bedside commode Pt Will Perform Toileting - Clothing Manipulation and hygiene: sit to/from stand;with min assist  OT Frequency: Min 2X/week   Barriers to D/C:            Co-evaluation              End of Session    Activity Tolerance: Patient limited by fatigue Patient left: in bed;with bed alarm set;with family/visitor present   Time: [id number] OT Time Calculation (min): 16 min Charges:  OT General Charges $OT Visit: 1 Procedure OT Evaluation $OT Eval Moderate Complexity: 1 Procedure G-CodesBenito Mccreedy OTR/L [id number] 06/03/2015, 4:52 PM

## 2015-06-03 NOTE — Progress Notes (Signed)
[facility] PROGRESS NOTE  Jerry Kerr [id number] DOB: 1933/11/21 DOA: 06/01/2015 PCP: [name], MD  Assessment/Plan: Principal Problem:   CAP (community acquired pneumonia) - continue current antibiotic regimen - Could be secondary to aspiration given dysphasia initial evaluation by speech therapy  Dysphagia - mentation improved after IVF rehydration - Will have speech therapy evaluate him again. - Nothing by mouth until reevaluation by speech therapy  Active Problems:   Fall down steps - Physical therapy evaluation   COPD (chronic obstructive pulmonary disease) ([place]) - Continue current medication regimen, stable   Hypertension   GERD (gastroesophageal reflux disease) - Stable on Protonix   Dementia - Continue current medication regimen, stable   Dehydration - Place on maintenance IV fluid   Fall   Atrial fibrillation ([place])   HLD (hyperlipidemia)   Chronic diastolic CHF (congestive heart failure) ([place])   Protein-calorie malnutrition, severe - Ensure once able to take oral  Code Status: Full Family Communication: None at bedside Disposition Plan: *Pending improvement in condition   Consultants:  None  Procedures:  None  Antibiotics:  Azithromycin  Rocephin   HPI/Subjective: Pt has no new complaints. No acute issues overnight.  Objective: Filed Vitals:   06/03/15 0502 06/03/15 0720  BP: 165/73 156/71  Pulse: 71 78  Temp: 98.2 F (36.8 C) 98.6 F (37 C)  Resp: 20 20    Intake/Output Summary (Last 24 hours) at 06/03/15 1711 Last data filed at 06/03/15 1400  Gross per 24 hour  Intake 3036.67 ml  Output    451 ml  Net 2585.67 ml   Filed Weights   06/01/15 1824 06/02/15 2100  Weight: 63.504 kg (140 lb) 63.8 kg (140 lb 10.5 oz)    Exam:   General:  Pt in nad, alert and awake  Cardiovascular: s1 and s2 wnl, no rubs  Respiratory: no wheezes, equal chest rise.  Abdomen: soft, nt, nd  Musculoskeletal: no cyanosis or  clubbing   Data Reviewed: Basic Metabolic Panel:  Recent Labs Lab 06/01/15 1845 06/02/15 0247  NA 140 138  K 4.7 4.0  CL 99* 105  CO2 25 23  GLUCOSE 89 115*  BUN 12 13  CREATININE 1.09 0.95  CALCIUM 9.7 8.3*  MG  --  1.7   Liver Function Tests:  Recent Labs Lab 06/01/15 1845 06/02/15 0247  AST 27 23  ALT 9* 8*  ALKPHOS 119 98  BILITOT 0.7 0.7  PROT 7.1 6.0*  ALBUMIN 3.3* 2.7*   No results for input(s): LIPASE, AMYLASE in the last 168 hours. No results for input(s): AMMONIA in the last 168 hours. CBC:  Recent Labs Lab 06/01/15 2110 06/02/15 0247  WBC 6.4 4.9  NEUTROABS 4.2 3.2  HGB 11.8* 9.8*  HCT 39.0 32.7*  MCV 89.7 89.3  PLT 153 124*   Cardiac Enzymes: No results for input(s): CKTOTAL, CKMB, CKMBINDEX, TROPONINI in the last 168 hours. BNP (last 3 results)  Recent Labs  06/01/15 2241  BNP 231.1*    ProBNP (last 3 results)  Recent Labs  07/07/14 1025  PROBNP 274.0*    CBG:  Recent Labs Lab 06/01/15 1837 06/03/15 0719  GLUCAP 73 91    Recent Results (from the past 240 hour(s))  Culture, blood (routine x 2)     Status: None (Preliminary result)   Collection Time: 06/01/15  6:25 PM  Result Value Ref Range Status   Specimen Description BLOOD RIGHT ANTECUBITAL  Final   Special Requests BOTTLES DRAWN AEROBIC ONLY 10CC  Final  Culture NO GROWTH 2 DAYS  Final   Report Status PENDING  Incomplete  Culture, blood (routine x 2)     Status: None (Preliminary result)   Collection Time: 06/01/15  9:11 PM  Result Value Ref Range Status   Specimen Description BLOOD LEFT ANTECUBITAL  Final   Special Requests BOTTLES DRAWN AEROBIC AND ANAEROBIC 5CC  Final   Culture NO GROWTH 2 DAYS  Final   Report Status PENDING  Incomplete  Urine culture     Status: None   Collection Time: 06/01/15  9:54 PM  Result Value Ref Range Status   Specimen Description URINE, CATHETERIZED  Final   Special Requests NONE  Final   Culture NO GROWTH 2 DAYS  Final    Report Status 06/03/2015 FINAL  Final  MRSA PCR Screening     Status: Abnormal   Collection Time: 06/02/15  2:18 AM  Result Value Ref Range Status   MRSA by PCR POSITIVE (A) NEGATIVE Final    Comment:        The GeneXpert MRSA Assay (FDA approved for NASAL specimens only), is one component of a comprehensive MRSA colonization surveillance program. It is not intended to diagnose MRSA infection nor to guide or monitor treatment for MRSA infections. RESULT CALLED TO, READ BACK BY AND VERIFIED WITH: V BROWN,RN @0501  06/02/15 MKELLY   Culture, sputum-assessment     Status: None   Collection Time: 06/02/15  2:34 AM  Result Value Ref Range Status   Specimen Description EXPECTORATED SPUTUM  Final   Special Requests NONE  Final   Sputum evaluation   Final    MICROSCOPIC FINDINGS SUGGEST THAT THIS SPECIMEN IS NOT REPRESENTATIVE OF LOWER RESPIRATORY SECRETIONS. PLEASE RECOLLECT. Gram Stain Report Called to,Read Back By and Verified With: V BROWN,RN @0453  06/02/15 MKELLY    Report Status 06/02/2015 FINAL  Final  Culture, blood (routine x 2) Call MD if unable to obtain prior to antibiotics being given     Status: None (Preliminary result)   Collection Time: 06/02/15  2:47 AM  Result Value Ref Range Status   Specimen Description BLOOD RIGHT ANTECUBITAL  Final   Special Requests BOTTLES DRAWN AEROBIC AND ANAEROBIC 10CC  Final   Culture NO GROWTH 1 DAY  Final   Report Status PENDING  Incomplete  Culture, blood (routine x 2) Call MD if unable to obtain prior to antibiotics being given     Status: None (Preliminary result)   Collection Time: 06/02/15  2:53 AM  Result Value Ref Range Status   Specimen Description BLOOD RIGHT HAND  Final   Special Requests BOTTLES DRAWN AEROBIC ONLY 6CC  Final   Culture NO GROWTH 1 DAY  Final   Report Status PENDING  Incomplete     Studies: Dg Chest 1 View  06/01/2015  CLINICAL DATA:  Confusion, weakness, shaking, shortness of breath recently, history  cancer, asthma, CHF, hypertension, COPD, dementia EXAM: CHEST 1 VIEW COMPARISON:  12/13/2013 FINDINGS: Kyphotic positioning. LEFT subclavian pacemaker leads project over RIGHT atrium and RIGHT ventricle. Stable heart size and mediastinal contours. Minimal bibasilar atelectasis. Probable underlying emphysematous changes. No definite infiltrate, pleural effusion or gross pneumothorax, though portions of the apices are obscured by the patient's head. IMPRESSION: Probable COPD changes with minimal bibasilar atelectasis. Electronically Signed   By: Lavonia Dana M.D.   On: 06/01/2015 19:05   Ct Head Wo Contrast  06/01/2015  CLINICAL DATA:  Altered mental status.  History of dementia. EXAM: CT HEAD WITHOUT CONTRAST TECHNIQUE: Contiguous  axial images were obtained from the base of the skull through the vertex without intravenous contrast. COMPARISON:  02/14/2015 FINDINGS: Sinuses/Soft tissues: Minimal mucosal thickening of bilateral maxillary sinuses and the left sphenoid sinus. Hypoplastic frontal sinuses. Clear mastoid air cells. Intracranial: Mild low density in the periventricular white matter likely related to small vessel disease. Cerebral and cerebellar atrophy. No mass lesion, hemorrhage, hydrocephalus, acute infarct, intra-axial, or extra-axial fluid collection. IMPRESSION: 1.  No acute intracranial abnormality. 2.  Cerebral atrophy and small vessel ischemic change. 3. Sinus disease. Electronically Signed   By: Abigail Miyamoto M.D.   On: 06/01/2015 23:44   Dg Swallowing Func-speech Pathology  06/02/2015  Objective Swallowing Evaluation: Type of Study: MBS-Modified Barium Swallow Study Patient Details Name: Jerry Kerr MRN: [id number] Date of Birth: 10-25-1933 Today's Date: 06/02/2015 Time: SLP Start Time (ACUTE ONLY): 1115-SLP Stop Time (ACUTE ONLY): 1146 SLP Time Calculation (min) (ACUTE ONLY): 31 min Past Medical History: Past Medical History Diagnosis Date . Asthma  . Dementia  . Back pain  . COPD (chronic  obstructive pulmonary disease) ([place])  . Hypertension  . Pacemaker  . Anxiety  . Depression  . Cancer (HCC)    SKIN, PROSTATE . GERD (gastroesophageal reflux disease)  . Substance abuse    ETOH . Dysphagia, unspecified(787.20) 12/12/2013 . Swallowing difficulty    SEES  DR. PYRTLE HAS HX ESOPHAGEAL DIL . Barrett's esophagus  . CHF (congestive heart failure) (HCC)    chronic diastolic HF . Dysrhythmia    atrial fibrillation Past Surgical History: Past Surgical History Procedure Laterality Date . Neck surgery   . Upper gastrointestinal endoscopy   . Colonoscopy   . Ankle surgery   . Skin graft   . Pacemaker insertion   . Tonsillectomy   . Esophageal dilation   . Kyphoplasty Bilateral 05/05/2014   Procedure: KYPHOPLASTY LUMBAR TWO;  Surgeon: Consuella Lose, MD;  Location: [facility];  Service: Neurosurgery;  Laterality: Bilateral; HPI: With history of Alzheimer's dementia, COPD, hypertension, status post permanent pacemaker, depression/anxiety, gastroesophageal reflux disease, chronic diastolic heart failure off diuretics will approximate 6 months per family, history of atrial fibrillation presented to the ED with worsening shortness of breath, worsening confusion, decreased oral intake, generalized weakness to the point whereby patient is no longer ambulating used to ambulate with the aid of a walker. Patient lives in an apartment with 24-hour care. Patient with a history of dementia and a such history was provided by patient's daughter and wife who are at bedside. Subjective: Pt in bed sleeping, but will arouse and following commands with eyes closed. Assessment / Plan / Recommendation CHL IP CLINICAL IMPRESSIONS 06/02/2015 Therapy Diagnosis Mild oral phase dysphagia;Severe pharyngeal phase dysphagia Clinical Impression Pt lethargic, but was able to be awakened to follow commands and elicit swallow. Pt has h/o cervical fusion c3-c7 (hardware anterior and posterior to spine). Pt has mild oral dysphagia noted with all  consistencies tested evidenced by oral and lingual residue and poor bolus control. Pt also has severe pharyngeal dysphagia. Pt had reduced cricophargeal opening resulting in backflow into pharynx and larynx across all tested consistencies. Pt's cervical hardward seems to play a part in this dysfunction. Pt did have a delayed throat clear or cough with each bolus but unable to clear and protect airway. Solids and pills were not tested due to the high risk for aspiration. Recommend pt be strict NPO. MBS would need to be repeated at a later date if PO diet. Prognosis for improvement is poor due to  deconditioning and cognition. Impact on safety and function Severe aspiration risk;Risk for inadequate nutrition/hydration   CHL IP TREATMENT RECOMMENDATION 06/02/2015 Treatment Recommendations Patient unable to participate in swallow therapy at this time   Prognosis 06/02/2015 Prognosis for Safe Diet Advancement Guarded Barriers to Reach Goals Cognitive deficits;Severity of deficits Barriers/Prognosis Comment -- CHL IP DIET RECOMMENDATION 06/02/2015 SLP Diet Recommendations NPO Liquid Administration via -- Medication Administration -- Compensations -- Postural Changes --   CHL IP OTHER RECOMMENDATIONS 11/08/2013 Recommended Consults -- Oral Care Recommendations -- Other Recommendations Order thickener from pharmacy   CHL IP FOLLOW UP RECOMMENDATIONS 12/14/2013 Follow up Recommendations Home health SLP   CHL IP FREQUENCY AND DURATION 12/12/2013 Speech Therapy Frequency (ACUTE ONLY) min 1 x/week Treatment Duration 1 week      CHL IP ORAL PHASE 06/02/2015 Oral Phase -- Oral - Pudding Teaspoon -- Oral - Pudding Cup -- Oral - Honey Teaspoon -- Oral - Honey Cup -- Oral - Nectar Teaspoon -- Oral - Nectar Cup -- Oral - Nectar Straw -- Oral - Thin Teaspoon Lingual/palatal residue Oral - Thin Cup Lingual/palatal residue;Piecemeal swallowing Oral - Thin Straw -- Oral - Puree -- Oral - Mech Soft -- Oral - Regular -- Oral - Multi-Consistency --  Oral - Pill -- Oral Phase - Comment --  CHL IP PHARYNGEAL PHASE 06/02/2015 Pharyngeal Phase Impaired Pharyngeal- Pudding Teaspoon -- Pharyngeal -- Pharyngeal- Pudding Cup -- Pharyngeal -- Pharyngeal- Honey Teaspoon -- Pharyngeal -- Pharyngeal- Honey Cup -- Pharyngeal -- Pharyngeal- Nectar Teaspoon -- Pharyngeal -- Pharyngeal- Nectar Cup -- Pharyngeal -- Pharyngeal- Nectar Straw -- Pharyngeal -- Pharyngeal- Thin Teaspoon -- Pharyngeal -- Pharyngeal- Thin Cup -- Pharyngeal -- Pharyngeal- Thin Straw -- Pharyngeal -- Pharyngeal- Puree -- Pharyngeal -- Pharyngeal- Mechanical Soft -- Pharyngeal -- Pharyngeal- Regular -- Pharyngeal -- Pharyngeal- Multi-consistency -- Pharyngeal -- Pharyngeal- Pill -- Pharyngeal -- Pharyngeal Comment --  CHL IP CERVICAL ESOPHAGEAL PHASE 06/02/2015 Cervical Esophageal Phase Impaired Pudding Teaspoon Reduced cricopharyngeal relaxation;Esophageal backflow into the pharynx;Esophageal backflow into the larynx Pudding Cup -- Honey Teaspoon -- Honey Cup -- Nectar Teaspoon Reduced cricopharyngeal relaxation;Esophageal backflow into the larynx;Esophageal backflow into the pharynx Nectar Cup -- Nectar Straw -- Thin Teaspoon Reduced cricopharyngeal relaxation;Esophageal backflow into the pharynx;Esophageal backflow into the larynx Thin Cup -- Thin Straw -- Puree -- Mechanical Soft -- Regular -- Multi-consistency -- Pill -- Cervical Esophageal Comment -- CHL IP GO 06/02/2015 Functional Assessment Tool Used (None) Functional Limitations Swallowing Swallow Current Status [id number]) CN Swallow Goal Status [id number]) CN Swallow Discharge Status [id number]) CN Motor Speech Current Status [id number]) (None) Motor Speech Goal Status [id number]) (None) Motor Speech Goal Status [id number]) (None) Spoken Language Comprehension Current Status [id number]) (None) Spoken Language Comprehension Goal Status [id number]) (None) Spoken Language Comprehension Discharge Status [id number]) (None) Spoken Language Expression Current Status [id number]) (None) Spoken  Language Expression Goal Status [id number]) (None) Spoken Language Expression Discharge Status [phone]) (None) Attention Current Status [id number]) (None) Attention Goal Status [id number]) (None) Attention Discharge Status [id number]) (None) Memory Current Status [id number]) (None) Memory Goal Status [id number]) (None) Memory Discharge Status [id number]) (None) Voice Current Status [id number]) (None) Voice Goal Status [id number]) (None) Voice Discharge Status [id number]) (None) Other Speech-Language Pathology Functional Limitation [phone]) (None) Other Speech-Language Pathology Functional Limitation Goal Status [id number]) (None) Other Speech-Language Pathology Functional Limitation Discharge Status [phone]) (None) Charlynne Cousins Ward, MA, CCC-SLP 06/02/2015 11:49 AM               Scheduled Meds: . antiseptic oral rinse  7 mL Mouth Rinse q12n4p  .  arformoterol  15 mcg Nebulization BID  . aspirin EC  81 mg Oral Daily  . azithromycin  500 mg Intravenous Q24H  . budesonide (PULMICORT) nebulizer solution  0.25 mg Nebulization BID  . cefTRIAXone (ROCEPHIN)  IV  1 g Intravenous Q24H  . chlorhexidine  15 mL Mouth Rinse BID  . Chlorhexidine Gluconate Cloth  6 each Topical Q0600  . divalproex  500 mg Oral BID  . enoxaparin (LOVENOX) injection  40 mg Subcutaneous Daily  . feeding supplement (ENSURE ENLIVE)  237 mL Oral BID BM  . fluticasone  2 spray Each Nare Daily  . gatifloxacin  1 drop Right Eye TID  . guaiFENesin  1,200 mg Oral BID  . loratadine  10 mg Oral Daily  . memantine  10 mg Oral BID  . mupirocin ointment  1 application Nasal BID  . pantoprazole  40 mg Oral Daily  . QUEtiapine  100 mg Oral QHS  . sertraline  100 mg Oral Daily   Continuous Infusions: . dextrose 5 % and 0.45 % NaCl with KCl 20 mEq/L 100 mL/hr at 06/03/15 0732    Time spent: > 35 minutes    Velvet Bathe  [facility] Pager [phone]. If 7PM-7AM, please contact night-coverage at [email or web address], password [facility] 06/03/2015, 5:11 PM  LOS: 1 day

## 2015-06-03 NOTE — Progress Notes (Signed)
SLP Cancellation Note  Patient Details Name: Jerry Kerr MRN: [id number] DOB: 1934-03-21   Cancelled treatment:       Reason Eval/Treat Not Completed: Patient had MBS yesterday indicating severe pharyngeal dysphagia with silent penetration/aspiration across textures.  New swallow orders entered today as the patient has had a dramatic change in his mentation.  Given MBS dated 06/02/2015 clinical evaluation is unable to be completed in order to make the safest PO diet recommendation.  Will plan for repeat MBS next date.    Shelly Flatten, MA, [facility] SLP [phone] Lamar Sprinkles 06/03/2015, 1:30 PM

## 2015-06-03 NOTE — Consult Note (Signed)
Consultation Note Date: 06/03/2015   Patient Name: Jerry Kerr  DOB: [birth year]  MRN: [id number]  Age / Sex: 82 y.o., male  PCP: [name], MD Referring Physician: Velvet Bathe, MD  Reason for Consultation: Establishing goals of care  Clinical Assessment/Narrative: 82 y.o. male history of Alzheimer's dementia, COPD, hypertension, status post permanent pacemaker, depression/anxiety, gastroesophageal reflux disease, chronic diastolic heart failure. Admitted for community acquired pneumonia, systemic inflammatory response syndrome, COPD, dehydration, and acute encephalopathy.Palliative consulted for goals of care.  I met today with the patient, his wife, and his daughter.  The patient seems somewhat confused and was not able to follow our conversation. At that point in time, his daughter wrote me a note stated that he has trouble because of his Alzheimer's disease and that she serves as healthcare power of attorney. There is a copy of this on the chart. I then met separately with his daughter and we discussed his clinical course to this point in time as well as the fact  that he has continued to have trouble with swallowing this point in time this hospitalization. We discussed plan for repeat swallow study.    We reviewed a MOST form and discussed how to develop plan of care to focus on continuing therapies that would maximize chance of being well enough to enjoy time outside the hospital and limiting therapies not in line with this goal.  She tells me that he is DO NOT RESUSCITATE. I changed this in the computer.   We'll plan to meet up again tomorrow morning to complete most form.   We did discuss aspiration at some length today as well as the fact that he would be unlikely to benefit from PEG tube his swallowing difficulties are predominantly from his dementia. We discussed this further following repeat swallow study  .         Primary Decision Maker:  Patient's daughter, [name]   Relationship to Patient daughter HCPOA:  Yes, copy on chart  SUMMARY OF RECOMMENDATIONS - Patient is DNR/DNI  - Plan for repeat swallow study. - We will meet again tomorrow to complete most form and review any other additional clinical information as it is available.   Code Status/Advance Care Planning: DNR    Code Status Orders        Start     Ordered   06/02/15 0159  Full code   Continuous     06/02/15 0159    Code Status History    Date Active Date Inactive Code Status Order ID Comments User Context   12/12/2013 10:30 AM 12/16/2013  4:22 PM DNR [id number]  Eugenie Filler, MD Inpatient   11/08/2013 12:03 AM 11/09/2013  6:27 PM Full Code [id number]  Allyne Gee, MD Inpatient   03/24/2012  2:56 AM 03/24/2012  4:32 PM Full Code [id number]  Theressa Millard, MD Inpatient    Advance Directive Documentation        Most Recent Value   Type of Advance Directive  Living will   Pre-existing out of facility DNR order (yellow form or pink MOST form)     "MOST" Form in Place?        Other Directives:Advanced Directive and Living Will  Symptom Management:   Confusion and lethargy: Appears to be improving. Continue treatment of underlying infection  Palliative Prophylaxis:   Aspiration, Bowel Regimen, Delirium Protocol and Frequent Pain Assessment  Psycho-social/Spiritual:  Support System: Strong Desire for further Chaplaincy support:No Additional Recommendations: Caregiving  Support/Resources and Education  on Hospice  Prognosis: Unable to determine  Discharge Planning: To be determined. Likely to skilled facility for rehabilitation in the short-term. His daughter does not feel that his prior living situation is acceptable option to consider on discharge as she believes he requires more supervision than he had prior to admission.   Chief Complaint/ Primary Diagnoses: Present on Admission:  . CAP (community  acquired pneumonia) . COPD (chronic obstructive pulmonary disease) ([place]) . Hypertension . Dehydration . Fall down steps . GERD (gastroesophageal reflux disease) . Dementia . Acute encephalopathy . Fall . Atrial fibrillation ([place]) . Alzheimer's dementia with behavioral disturbance . HLD (hyperlipidemia) . Chronic diastolic CHF (congestive heart failure) ([place]) . FTT (failure to thrive) in adult . SIRS (systemic inflammatory response syndrome) (H[facility])  I have reviewed the medical record, interviewed the patient and family, and examined the patient. The following aspects are pertinent.  Past Medical History  Diagnosis Date  . Asthma   . Dementia   . Back pain   . COPD (chronic obstructive pulmonary disease) ([place])   . Hypertension   . Pacemaker   . Anxiety   . Depression   . Cancer (HCC)     SKIN, PROSTATE  . GERD (gastroesophageal reflux disease)   . Substance abuse     ETOH  . Dysphagia, unspecified(787.20) 12/12/2013  . Swallowing difficulty     SEES  DR. PYRTLE HAS HX ESOPHAGEAL DIL  . Barrett's esophagus   . CHF (congestive heart failure) (HCC)     chronic diastolic HF  . Dysrhythmia     atrial fibrillation   Social History   Social History  . Marital Status: Married    Spouse Name: N/A  . Number of Children: 2  . Years of Education: 14   Occupational History  . Retired    Social History Main Topics  . 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 Smokeless tobacco: Never Used  . Alcohol Use: Yes     Comment: quit  . Drug Use: No  . Sexual Activity: No   Other Topics Concern  . None   Social History Narrative   Lives at home with 24 hour caregivers.   Caffeine use: Drinks coffee occassionally    Family History  Problem Relation Age of Onset  . CAD Father   . Heart disease Father   . Hypertension Mother   . AAA (abdominal aortic aneurysm) Mother   . Heart disease Mother   . Dementia Neg Hx    Scheduled Meds: . antiseptic oral rinse  7 mL Mouth Rinse q12n4p   . arformoterol  15 mcg Nebulization BID  . aspirin EC  81 mg Oral Daily  . azithromycin  500 mg Intravenous Q24H  . budesonide (PULMICORT) nebulizer solution  0.25 mg Nebulization BID  . cefTRIAXone (ROCEPHIN)  IV  1 g Intravenous Q24H  . chlorhexidine  15 mL Mouth Rinse BID  . Chlorhexidine Gluconate Cloth  6 each Topical Q0600  . divalproex  500 mg Oral BID  . enoxaparin (LOVENOX) injection  40 mg Subcutaneous Daily  . feeding supplement (ENSURE ENLIVE)  237 mL Oral BID BM  . fluticasone  2 spray Each Nare Daily  . gatifloxacin  1 drop Right Eye TID  . guaiFENesin  1,200 mg Oral BID  . loratadine  10 mg Oral Daily  . memantine  10 mg Oral BID  . mupirocin ointment  1 application Nasal BID  . pantoprazole  40 mg Oral Daily  . QUEtiapine  100  mg Oral QHS  . sertraline  100 mg Oral Daily   Continuous Infusions: . dextrose 5 % and 0.45 % NaCl with KCl 20 mEq/L 100 mL/hr at 06/03/15 1729   PRN Meds:.ipratropium, levalbuterol, traMADol Medications Prior to Admission:  Prior to Admission medications   Medication Sig Start Date End Date Taking? Authorizing Provider  albuterol (PROVENTIL HFA;VENTOLIN HFA) 108 (90 BASE) MCG/ACT inhaler Inhale into the lungs every 6 (six) hours as needed for wheezing or shortness of breath.    Historical Provider, MD  albuterol (PROVENTIL) (2.5 MG/3ML) 0.083% nebulizer solution Take 2.5 mg by nebulization every 6 (six) hours as needed for wheezing or shortness of breath.    Historical Provider, MD  aspirin EC 81 MG tablet Take 81 mg by mouth daily.    Historical Provider, MD  divalproex (DEPAKOTE SPRINKLE) 125 MG capsule Take 500 mg by mouth 2 (two) times daily.    Historical Provider, MD  memantine (NAMENDA) 10 MG tablet Take 10 mg by mouth 2 (two) times daily.    Historical Provider, MD  moxifloxacin (VIGAMOX) 0.5 % ophthalmic solution Place 1 drop into the right eye 3 (three) times daily. 05/23/15   Billy Fischer, MD  omeprazole (PRILOSEC) 20 MG capsule  TAKE TWO   CAPSULES (40 MG TOTAL) BY MOUTH TWO   TIMES DAILY 04/19/15   Jerene Bears, MD  QUEtiapine (SEROQUEL) 100 MG tablet Take 1 tablet (100 mg total) by mouth at bedtime. 12/16/13   Orson Eva, MD  sertraline (ZOLOFT) 100 MG tablet Take 100 mg by mouth daily.    Historical Provider, MD  sertraline (ZOLOFT) 50 MG tablet Take 100 mg by mouth daily.     Historical Provider, MD  traMADol (ULTRAM) 50 MG tablet Take 50 mg by mouth every 8 (eight) hours as needed (back pain).  02/06/15   Historical Provider, MD   Allergies  Allergen Reactions  . Hydrocodone Other (See Comments)    Confusion. agitation  . Other     Review of Systems  Is an unreliable historian. His only complaint is feeling tired. Otherwise review of systems negative   Physical Exam  General: Alert, awake, in no acute distress.  HEENT: No bruits, no goiter, no JVD Heart: Regular rate and rhythm. No murmur appreciated. Lungs: Good air movement, clear Abdomen: Soft, nontender, nondistended, positive bowel sounds.  Ext: No significant edema Skin: Warm and dry Neuro: Grossly intact, nonfocal. Psych:  Intermittently confused  Vital Signs: BP 156/71 mmHg  Pulse 78  Temp(Src) 98.6 F (37 C) (Oral)  Resp 20  Ht '5\' 9"'  (1.753 m)  Wt 63.8 kg (140 lb 10.5 oz)  BMI 20.76 kg/m2  SpO2 96%  SpO2: SpO2: 96 % O2 Device:SpO2: 96 % O2 Flow Rate: .O2 Flow Rate (L/min): 2 L/min  IO: Intake/output summary:  Intake/Output Summary (Last 24 hours) at 06/03/15 1922 Last data filed at 06/03/15 1800  Gross per 24 hour  Intake 3436.67 ml  Output    451 ml  Net 2985.67 ml    LBM: Last BM Date: 06/03/15 Baseline Weight: Weight: 63.504 kg (140 lb) Most recent weight: Weight: 63.8 kg (140 lb 10.5 oz)      Palliative Assessment/Data:  Flowsheet Rows        Most Recent Value   Intake Tab    Referral Department  Hospitalist   Unit at Time of Referral  Med/Surg Unit   Palliative Care Primary Diagnosis  Neurology   Date Notified   06/02/15  Palliative Care Type  New Palliative care   Date of Admission  06/02/15   Date first seen by Palliative Care  06/03/15   # of days Palliative referral response time  1 Day(s)   # of days IP prior to Palliative referral  0   Clinical Assessment    Palliative Performance Scale Score  50%   Pain Max last 24 hours  3   Pain Min Last 24 hours  0   Psychosocial & Spiritual Assessment    Palliative Care Outcomes    Patient/Family meeting held?  Yes   Who was at the meeting?  Patients daughter/poa   Palliative Care Outcomes  Clarified goals of care, Changed CPR status      Additional Data Reviewed:  CBC:    Component Value Date/Time   WBC 4.9 06/02/2015 0247   HGB 9.8* 06/02/2015 0247   HCT 32.7* 06/02/2015 0247   PLT 124* 06/02/2015 0247   MCV 89.3 06/02/2015 0247   NEUTROABS 3.2 06/02/2015 0247   LYMPHSABS 1.1 06/02/2015 0247   MONOABS 0.5 06/02/2015 0247   EOSABS 0.1 06/02/2015 0247   BASOSABS 0.0 06/02/2015 0247   Comprehensive Metabolic Panel:    Component Value Date/Time   NA 138 06/02/2015 0247   K 4.0 06/02/2015 0247   CL 105 06/02/2015 0247   CO2 23 06/02/2015 0247   BUN 13 06/02/2015 0247   CREATININE 0.95 06/02/2015 0247   GLUCOSE 115* 06/02/2015 0247   CALCIUM 8.3* 06/02/2015 0247   AST 23 06/02/2015 0247   ALT 8* 06/02/2015 0247   ALKPHOS 98 06/02/2015 0247   BILITOT 0.7 06/02/2015 0247   PROT 6.0* 06/02/2015 0247   ALBUMIN 2.7* 06/02/2015 0247     Time In: 1630 Time Out: 1755  Time Total: 85 Greater than 50%  of this time was spent counseling and coordinating care related to the above assessment and plan.  Signed by: Micheline Rough, MD  Micheline Rough, MD  06/03/2015, 7:22 PM  Please contact Palliative Medicine Team phone at [phone] for questions and concerns.

## 2015-06-03 NOTE — Progress Notes (Signed)
I called and spoke with Mr. Auringer daughter.  Plan to meet with her this afternoon at 430pm.

## 2015-06-04 ENCOUNTER — Inpatient Hospital Stay (HOSPITAL_COMMUNITY): Payer: Medicare Other

## 2015-06-04 LAB — RESPIRATORY VIRUS PANEL
ADENOVIRUS: NEGATIVE
INFLUENZA A: NEGATIVE
Influenza B: NEGATIVE
METAPNEUMOVIRUS: POSITIVE — AB
Parainfluenza 1: NEGATIVE
Parainfluenza 2: NEGATIVE
Parainfluenza 3: NEGATIVE
RESPIRATORY SYNCYTIAL VIRUS A: NEGATIVE
RHINOVIRUS: NEGATIVE
Respiratory Syncytial Virus B: NEGATIVE

## 2015-06-04 LAB — LEGIONELLA ANTIGEN, URINE

## 2015-06-04 NOTE — Progress Notes (Signed)
MBSS complete. Full report located under chart review in imaging section.  Jenella Craigie Paiewonsky, M.A. CCC-SLP [phone]  

## 2015-06-04 NOTE — Progress Notes (Signed)
Daily Progress Note   Patient Name: Jerry Kerr       Date: 06/04/2015 DOB: 22-Dec-1933  Age: 82 y.o. MRN#: [id number] Attending Physician: Velvet Bathe, MD Primary Care Physician: [name], MD Admit Date: 06/01/2015  Reason for Consultation/Follow-up: Establishing goals of care  Subjective: I met with Jerry Kerr daughter, Jerry Kerr, this morning.  We talked about MOST form and completed this AM.  We also discussed the fact that he has been chronically aspirating and will continue to do so.  We discussed comfort feeding and having SLP assist in developing safest diet that he would find agreeable in order to begin feeding for comfort.  Concern is for continued aspiration risk and that he is not able to maintain his nutritional status even without taking aspiration risk into consideration.  Length of Stay: 2 days  Current Medications: Scheduled Meds:  . antiseptic oral rinse  7 mL Mouth Rinse q12n4p  . arformoterol  15 mcg Nebulization BID  . aspirin EC  81 mg Oral Daily  . azithromycin  500 mg Intravenous Q24H  . budesonide (PULMICORT) nebulizer solution  0.25 mg Nebulization BID  . cefTRIAXone (ROCEPHIN)  IV  1 g Intravenous Q24H  . chlorhexidine  15 mL Mouth Rinse BID  . Chlorhexidine Gluconate Cloth  6 each Topical Q0600  . divalproex  500 mg Oral BID  . enoxaparin (LOVENOX) injection  40 mg Subcutaneous Daily  . feeding supplement (ENSURE ENLIVE)  237 mL Oral BID BM  . fluticasone  2 spray Each Nare Daily  . gatifloxacin  1 drop Right Eye TID  . guaiFENesin  1,200 mg Oral BID  . loratadine  10 mg Oral Daily  . memantine  10 mg Oral BID  . mupirocin ointment  1 application Nasal BID  . pantoprazole  40 mg Oral Daily  . QUEtiapine  100 mg Oral QHS  . sertraline  100 mg  Oral Daily    Continuous Infusions: . dextrose 5 % and 0.45 % NaCl with KCl 20 mEq/L 100 mL/hr at 06/03/15 1729    PRN Meds: ipratropium, levalbuterol, traMADol  Physical Exam: Physical Exam             General: Alert, awake, but more withdrawn today. In no acute distress.  HEENT: No bruits, no goiter, no JVD Heart: Regular  rate and rhythm. No murmur appreciated. Lungs: Fair air movement Abdomen: Soft, nontender, nondistended, positive bowel sounds.  Ext: No significant edema Skin: Warm and dry  Vital Signs: BP 165/96 mmHg  Pulse 80  Temp(Src) 98.5 F (36.9 C) (Oral)  Resp 18  Ht '5\' 9"'  (1.753 m)  Wt 63.2 kg (139 lb 5.3 oz)  BMI 20.57 kg/m2  SpO2 100% SpO2: SpO2: 100 % O2 Device: O2 Device: Nasal Cannula O2 Flow Rate: O2 Flow Rate (L/min): 2 L/min  Intake/output summary:  Intake/Output Summary (Last 24 hours) at 06/04/15 1623 Last data filed at 06/04/15 1300  Gross per 24 hour  Intake    400 ml  Output   1350 ml  Net   -950 ml   LBM: Last BM Date: 06/03/15 Baseline Weight: Weight: 63.504 kg (140 lb) Most recent weight: Weight: 63.2 kg (139 lb 5.3 oz)       Palliative Assessment/Data: Flowsheet Rows        Most Recent Value   Intake Tab    Referral Department  Hospitalist   Unit at Time of Referral  Med/Surg Unit   Palliative Care Primary Diagnosis  Neurology   Date Notified  06/02/15   Palliative Care Type  New Palliative care   Reason for referral  Clarify Goals of Care   Date of Admission  06/02/15   Date first seen by Palliative Care  06/03/15   # of days Palliative referral response time  1 Day(s)   # of days IP prior to Palliative referral  0   Clinical Assessment    Palliative Performance Scale Score  50%   Pain Max last 24 hours  3   Pain Min Last 24 hours  0   Psychosocial & Spiritual Assessment    Palliative Care Outcomes    Patient/Family meeting held?  Yes   Who was at the meeting?  Patients daughter/poa   Palliative Care Outcomes   Clarified goals of care, Changed CPR status      Additional Data Reviewed: CBC    Component Value Date/Time   WBC 4.9 06/02/2015 0247   RBC 3.66* 06/02/2015 0247   HGB 9.8* 06/02/2015 0247   HCT 32.7* 06/02/2015 0247   PLT 124* 06/02/2015 0247   MCV 89.3 06/02/2015 0247   MCH 26.8 06/02/2015 0247   MCHC 30.0 06/02/2015 0247   RDW 16.7* 06/02/2015 0247   LYMPHSABS 1.1 06/02/2015 0247   MONOABS 0.5 06/02/2015 0247   EOSABS 0.1 06/02/2015 0247   BASOSABS 0.0 06/02/2015 0247    CMP     Component Value Date/Time   NA 138 06/02/2015 0247   K 4.0 06/02/2015 0247   CL 105 06/02/2015 0247   CO2 23 06/02/2015 0247   GLUCOSE 115* 06/02/2015 0247   BUN 13 06/02/2015 0247   CREATININE 0.95 06/02/2015 0247   CALCIUM 8.3* 06/02/2015 0247   PROT 6.0* 06/02/2015 0247   ALBUMIN 2.7* 06/02/2015 0247   AST 23 06/02/2015 0247   ALT 8* 06/02/2015 0247   ALKPHOS 98 06/02/2015 0247   BILITOT 0.7 06/02/2015 0247   GFRNONAA >60 06/02/2015 0247   GFRAA >60 06/02/2015 0247       Problem List:  Patient Active Problem List   Diagnosis Date Noted  . CAP (community acquired pneumonia) 06/02/2015  . FTT (failure to thrive) in adult 06/02/2015  . SIRS (systemic inflammatory response syndrome) ([place]) 06/02/2015  . Protein-calorie malnutrition, severe 06/02/2015  . Dementia with behavioral disturbance 12/24/2014  . Substance  abuse 12/24/2014  . H/O personality disorder 12/24/2014  . Chronic diastolic CHF (congestive heart failure) ([place]) 07/02/2014  . Atrial fibrillation ([place]) 01/01/2014  . Tachy-brady syndrome ([place]) 01/01/2014  . Exertional dyspnea 01/01/2014  . Acute encephalopathy 12/12/2013  . Dehydration 12/12/2013  . Fall 12/12/2013  . Dysphagia, unspecified(787.20) 12/12/2013  . Compression fracture of L2 ([place]) 12/12/2013  . Near syncope 11/07/2013  . Dementia 11/07/2013  . Esophageal dysphagia 06/20/2013  . Esophageal ring 06/20/2013  . GERD (gastroesophageal reflux disease)  06/20/2013  . History of adenomatous polyp of colon 06/20/2013  . Alzheimer's dementia with behavioral disturbance 03/08/2013  . AA (alcohol abuse) 03/08/2013  . Absolute anemia 03/08/2013  . CAFL (chronic airflow limitation) ([place]) 03/08/2013  . Abnormal LFTs 03/08/2013  . Family history of colonic polyps 03/08/2013  . A-fib ([place]) 03/08/2013  . H/O malignant neoplasm of prostate 03/08/2013  . BP (high blood pressure) 03/08/2013  . HLD (hyperlipidemia) 03/08/2013  . Hypomagnesemia 03/08/2013  . Osteopenia 03/08/2013  . Artificial cardiac pacemaker 03/08/2013  . Multiple rib fractures 03/24/2012  . Fall down steps 03/24/2012  . Altered mental status 03/24/2012  . COPD (chronic obstructive pulmonary disease) ([place]) 03/24/2012  . Hypertension 03/24/2012  . Spinal stenosis of lumbar region with radiculopathy 03/24/2012  . Fracture of lumbar spine ([place]) 10/18/2007  . Incarcerated incisional hernia 03/27/2007     Palliative Care Assessment & Plan    1.Code Status:  DNR    Code Status Orders        Start     Ordered   06/03/15 1923  Do not attempt resuscitation (DNR)   Continuous    Question Answer Comment  In the event of cardiac or respiratory ARREST Do not call a "code blue"   In the event of cardiac or respiratory ARREST Do not perform Intubation, CPR, defibrillation or ACLS   In the event of cardiac or respiratory ARREST Use medication by any route, position, wound care, and other measures to relive pain and suffering. May use oxygen, suction and manual treatment of airway obstruction as needed for comfort.      06/03/15 1922    Code Status History    Date Active Date Inactive Code Status Order ID Comments User Context   06/02/2015  1:59 AM 06/03/2015  7:22 PM Full Code [id number]  Eugenie Filler, MD Inpatient   12/12/2013 10:30 AM 12/16/2013  4:22 PM DNR [id number]  Eugenie Filler, MD Inpatient   11/08/2013 12:03 AM 11/09/2013  6:27 PM Full Code [id number]  Allyne Gee,  MD Inpatient   03/24/2012  2:56 AM 03/24/2012  4:32 PM Full Code [id number]  Theressa Millard, MD Inpatient    Advance Directive Documentation        Most Recent Value   Type of Advance Directive  Living will   Pre-existing out of facility DNR order (yellow form or pink MOST form)     "MOST" Form in Place?         2. Goals of Care/Additional Recommendations:  Patient with current PNA and recurrent aspiration risk.  I began discussion of this with family today and we talked about Speech Therapy assisting in developing plan for comfort feeding while understanding that he has chronic aspiration and that this is something that will continue to occur in the future.    I spoke with his SLP, and she will work with patient and family tomorrow to develop diet for comfort feedings with this in mind.  If he  continues to acutely aspirate, his prognosis will be limited and I began to discuss pathway for comfort care with family today.  If he does not acutely aspirate, his prognosis is still limited as this will continue to be a problem in the future.  His family is hopeful he will be able to regain some functional status with rehab and is hopeful he will improve enough to discharge to SNF for rehab.  I spoke with them about the fact that he would benefit from enrollment in hospice if he does not continue to improve and it appears that we are trying to push his body in a way that he is in not able to go.  Limitations on Scope of Treatment: No Artificial Feeding  Psycho-social Needs: Caregiving  Support/Resources  3. Symptom Management:       1.Dysphagia: chronic aspiration risk.  Discussed development of diet for comfort with SLP.  Appreciate her assistance.  2. Confusion: Appears worse again today.  Continue txt of underlying infection.  4. Palliative Prophylaxis:   Aspiration, Bowel Regimen, Delirium Protocol and Frequent Pain Assessment  5. Prognosis: Unable to determine  6. Discharge  Planning:  To be determined   Care plan was discussed with patient's family, including her daughter, and SLP  Thank you for allowing the Palliative Medicine Team to assist in the care of this patient.   Time In: 0930 Time Out: 1015 Total Time 45 Prolonged Time Billed no        Micheline Rough, MD  06/04/2015, 4:23 PM  Please contact Palliative Medicine Team phone at [phone] for questions and concerns.

## 2015-06-04 NOTE — Progress Notes (Signed)
Physical Therapy Treatment Patient Details Name: Jerry Kerr MRN: [id number] DOB: 06-14-1933 Today's Date: 06/04/2015    History of Present Illness 82 y.o. male history of Alzheimer's dementia, Afib, back pain, cancer, GERD, sustance abuse, dysphagia, COPD, hypertension, status post permanent pacemaker, depression/anxiety, gastroesophageal reflux disease, chronic diastolic heart failure. Admitted for confusion, weakness, SOB.    PT Comments    Very motivated to walk today, but quite limited by fatigue (away at [facility] earlier as well); short, staccato steps with gait, resembling festination; Patient required supplemental oxygen to maintain oxygen saturations at acceptable, safe levels with physical activity.   Follow Up Recommendations  SNF     Equipment Recommendations  None recommended by PT    Recommendations for Other Services       Precautions / Restrictions Precautions Precautions: Fall    Mobility  Bed Mobility Overal bed mobility: Needs Assistance Bed Mobility: Supine to Sit     Supine to sit: Mod assist     General bed mobility comments: Cues for technique; good initiation of moving LEs off of teh bed; used rails; mod assist to square off hips at EOB; inefficient movement  Transfers Overall transfer level: Needs assistance Equipment used: Rolling walker (2 wheeled) Transfers: Sit to/from Stand Sit to Stand: Mod assist         General transfer comment: Mod assist to power up; cue for safety and hand placement; continued posterior lean  Ambulation/Gait Ambulation/Gait assistance: Mod assist;+2 safety/equipment Ambulation Distance (Feet): 20 Feet Assistive device: Rolling walker (2 wheeled) Gait Pattern/deviations: Decreased step length - right;Decreased step length - left;Trunk flexed;Festinating;Shuffle Gait velocity: slow   General Gait Details: Decr weight shift into stand R and L, making it difficult to unwegh either LE for advancement; verbal and  tactile cueing to weight shift fully into stance and take long steps; very fatigued at end of walk   Stairs            Wheelchair Mobility    Modified Rankin (Stroke Patients Only)       Balance     Sitting balance-Leahy Scale: Fair       Standing balance-Leahy Scale: Poor                      Cognition Arousal/Alertness: Awake/alert Behavior During Therapy: WFL for tasks assessed/performed Overall Cognitive Status: History of cognitive impairments - at baseline                      Exercises      General Comments General comments (skin integrity, edema, etc.): O2 sats decr to 87% with amb on Room Air; restarted O2 3 L and sats returned to above 90%      Pertinent Vitals/Pain Pain Assessment: Faces Faces Pain Scale: Hurts little more Pain Location: Pt indicated his grimace was due to effort Pain Descriptors / Indicators: Grimacing Pain Intervention(s): Monitored during session    Home Living                      Prior Function            PT Goals (current goals can now be found in the care plan section) Acute Rehab PT Goals Patient Stated Goal: daughter reports pt wants independence PT Goal Formulation: With family Time For Goal Achievement: 06/16/15 Potential to Achieve Goals: Fair Progress towards PT goals: Progressing toward goals (slow progress)    Frequency  Min 2X/week    PT  Plan Current plan remains appropriate    Co-evaluation             End of Session Equipment Utilized During Treatment: Gait belt;Oxygen Activity Tolerance: Patient limited by fatigue Patient left: in chair;with call bell/phone within reach;with chair alarm set     Time: 1204-1232 PT Time Calculation (min) (ACUTE ONLY): 28 min  Charges:  $Gait Training: 8-22 mins $Therapeutic Activity: 8-22 mins                    G Codes:      Quin Hoop 06/04/2015, 1:54 PM  Roney Marion, [place] Pager  [phone] Office [phone]'

## 2015-06-04 NOTE — Clinical Documentation Improvement (Signed)
Internal Medicine  Can the diagnosis of pneumonia be further specified? Please document response in next progress note NOT in BPA drop down box. Thank you!   Type of Pneumonia - CAP, HCAP, Bacterial Pneumonia, Aspiration Pneumonia, Gram Negative Pneumonia, Other Condition, Unable to Clinically Determine  Other condition  Clinically Undetermined  Supporting Information: :   History of Alzheimer's Dementia  "MBS yesterday indicating severe pharyngeal dysphagia with silent penetration/&aspiration across textures".  Please exercise your independent, professional judgment when responding. A specific answer is not anticipated or expected.  Thank You, Zoila Shutter RN, BSN, [place] [phone]; Cell: [phone]

## 2015-06-04 NOTE — Progress Notes (Signed)
[facility] PROGRESS NOTE  Jaeden Schnittker [id number] DOB: Feb 04, 1934 DOA: 06/01/2015 PCP: [name], MD  Assessment/Plan: Principal Problem:   CAP (community acquired pneumonia) - continue current antibiotic regimen - Could be secondary to aspiration given severe dysphasia by multiple evaluations by speech therapy  Dysphagia - Speech therapy recommendations reports severe dysphagia - Discussed with family who would like to or is leaning more towards avoiding PEG tube placement. In lieu of current findings most likely patient will be to transition to residential hospice  Active Problems:   Fall down steps - Physical therapy evaluation    COPD (chronic obstructive pulmonary disease) ([place]) - Continue current medication regimen, stable    Hypertension   GERD (gastroesophageal reflux disease) - Stable on Protonix   Dementia - Continue current medication regimen, may also be contributing to dysphagia   Dehydration - Continue maintenance IV fluid   Fall   Atrial fibrillation (HCC)   HLD (hyperlipidemia)   Chronic diastolic CHF (congestive heart failure) (HCC)   Protein-calorie malnutrition, severe - Ensure once able to take oral  Code Status: Full Family Communication: None at bedside Disposition Plan: Most likely residential hospice placement   Consultants:  None  Procedures:  None  Antibiotics:  Azithromycin  Rocephin   HPI/Subjective: Pt has no new complaints. No acute issues overnight.  Objective: Filed Vitals:   06/04/15 0450 06/04/15 0900  BP: 154/114 165/96  Pulse: 88 80  Temp: 98.9 F (37.2 C) 98.5 F (36.9 C)  Resp: 18 18    Intake/Output Summary (Last 24 hours) at 06/04/15 1656 Last data filed at 06/04/15 1300  Gross per 24 hour  Intake    400 ml  Output   1350 ml  Net   -950 ml   Filed Weights   06/01/15 1824 06/02/15 2100 06/03/15 2130  Weight: 63.504 kg (140 lb) 63.8 kg (140 lb 10.5 oz) 63.2 kg (139 lb 5.3 oz)     Exam:   General:  Pt in nad, alert and awake  Cardiovascular: s1 and s2 wnl, no rubs  Respiratory: no wheezes, equal chest rise.  Abdomen: soft, nt, nd  Musculoskeletal: no cyanosis or clubbing   Data Reviewed: Basic Metabolic Panel:  Recent Labs Lab 06/01/15 1845 06/02/15 0247  NA 140 138  K 4.7 4.0  CL 99* 105  CO2 25 23  GLUCOSE 89 115*  BUN 12 13  CREATININE 1.09 0.95  CALCIUM 9.7 8.3*  MG  --  1.7   Liver Function Tests:  Recent Labs Lab 06/01/15 1845 06/02/15 0247  AST 27 23  ALT 9* 8*  ALKPHOS 119 98  BILITOT 0.7 0.7  PROT 7.1 6.0*  ALBUMIN 3.3* 2.7*   No results for input(s): LIPASE, AMYLASE in the last 168 hours. No results for input(s): AMMONIA in the last 168 hours. CBC:  Recent Labs Lab 06/01/15 2110 06/02/15 0247  WBC 6.4 4.9  NEUTROABS 4.2 3.2  HGB 11.8* 9.8*  HCT 39.0 32.7*  MCV 89.7 89.3  PLT 153 124*   Cardiac Enzymes: No results for input(s): CKTOTAL, CKMB, CKMBINDEX, TROPONINI in the last 168 hours. BNP (last 3 results)  Recent Labs  06/01/15 2241  BNP 231.1*    ProBNP (last 3 results)  Recent Labs  07/07/14 1025  PROBNP 274.0*    CBG:  Recent Labs Lab 06/01/15 1837 06/03/15 0719  GLUCAP 73 91    Recent Results (from the past 240 hour(s))  Culture, blood (routine x 2)     Status: None (  Preliminary result)   Collection Time: 06/01/15  6:25 PM  Result Value Ref Range Status   Specimen Description BLOOD RIGHT ANTECUBITAL  Final   Special Requests BOTTLES DRAWN AEROBIC ONLY 10CC  Final   Culture NO GROWTH 3 DAYS  Final   Report Status PENDING  Incomplete  Culture, blood (routine x 2)     Status: None (Preliminary result)   Collection Time: 06/01/15  9:11 PM  Result Value Ref Range Status   Specimen Description BLOOD LEFT ANTECUBITAL  Final   Special Requests BOTTLES DRAWN AEROBIC AND ANAEROBIC 5CC  Final   Culture NO GROWTH 3 DAYS  Final   Report Status PENDING  Incomplete  Urine culture      Status: None   Collection Time: 06/01/15  9:54 PM  Result Value Ref Range Status   Specimen Description URINE, CATHETERIZED  Final   Special Requests NONE  Final   Culture NO GROWTH 2 DAYS  Final   Report Status 06/03/2015 FINAL  Final  MRSA PCR Screening     Status: Abnormal   Collection Time: 06/02/15  2:18 AM  Result Value Ref Range Status   MRSA by PCR POSITIVE (A) NEGATIVE Final    Comment:        The GeneXpert MRSA Assay (FDA approved for NASAL specimens only), is one component of a comprehensive MRSA colonization surveillance program. It is not intended to diagnose MRSA infection nor to guide or monitor treatment for MRSA infections. RESULT CALLED TO, READ BACK BY AND VERIFIED WITH: V BROWN,RN @0501  06/02/15 MKELLY   Culture, sputum-assessment     Status: None   Collection Time: 06/02/15  2:34 AM  Result Value Ref Range Status   Specimen Description EXPECTORATED SPUTUM  Final   Special Requests NONE  Final   Sputum evaluation   Final    MICROSCOPIC FINDINGS SUGGEST THAT THIS SPECIMEN IS NOT REPRESENTATIVE OF LOWER RESPIRATORY SECRETIONS. PLEASE RECOLLECT. Gram Stain Report Called to,Read Back By and Verified With: V BROWN,RN @0453  06/02/15 MKELLY    Report Status 06/02/2015 FINAL  Final  Culture, blood (routine x 2) Call MD if unable to obtain prior to antibiotics being given     Status: None (Preliminary result)   Collection Time: 06/02/15  2:47 AM  Result Value Ref Range Status   Specimen Description BLOOD RIGHT ANTECUBITAL  Final   Special Requests BOTTLES DRAWN AEROBIC AND ANAEROBIC 10CC  Final   Culture NO GROWTH 2 DAYS  Final   Report Status PENDING  Incomplete  Culture, blood (routine x 2) Call MD if unable to obtain prior to antibiotics being given     Status: None (Preliminary result)   Collection Time: 06/02/15  2:53 AM  Result Value Ref Range Status   Specimen Description BLOOD RIGHT HAND  Final   Special Requests BOTTLES DRAWN AEROBIC ONLY [place]  Final    Culture NO GROWTH 2 DAYS  Final   Report Status PENDING  Incomplete     Studies: Dg Swallowing Func-speech Pathology  06/04/2015  Objective Swallowing Evaluation: Type of Study: MBS-Modified Barium Swallow Study Patient Details Name: Shondale Slavinski MRN: [id number] Date of Birth: [birth year] Today's Date: 06/04/2015 Time: SLP Start Time (ACUTE ONLY): 1138-SLP Stop Time (ACUTE ONLY): 1147 SLP Time Calculation (min) (ACUTE ONLY): 9 min Past Medical History: Past Medical History Diagnosis Date . Asthma  . Dementia  . Back pain  . COPD (chronic obstructive pulmonary disease) ([place])  . Hypertension  . Pacemaker  . Anxiety  .  Depression  . Cancer (HCC)    SKIN, PROSTATE . GERD (gastroesophageal reflux disease)  . Substance abuse    ETOH . Dysphagia, unspecified(787.20) 12/12/2013 . Swallowing difficulty    SEES  DR. PYRTLE HAS HX ESOPHAGEAL DIL . Barrett's esophagus  . CHF (congestive heart failure) (HCC)    chronic diastolic HF . Dysrhythmia    atrial fibrillation Past Surgical History: Past Surgical History Procedure Laterality Date . Neck surgery   . Upper gastrointestinal endoscopy   . Colonoscopy   . Ankle surgery   . Skin graft   . Pacemaker insertion   . Tonsillectomy   . Esophageal dilation   . Kyphoplasty Bilateral 05/05/2014   Procedure: KYPHOPLASTY LUMBAR TWO;  Surgeon: Consuella Lose, MD;  Location: [place] [facility];  Service: Neurosurgery;  Laterality: Bilateral; HPI: With history of Alzheimer's dementia, COPD, hypertension, status post permanent pacemaker, depression/anxiety, gastroesophageal reflux disease, chronic diastolic heart failure off diuretics will approximate 6 months per family, history of atrial fibrillation presented to the ED with worsening shortness of breath, worsening confusion, decreased oral intake, generalized weakness to the point whereby patient is no longer ambulating used to ambulate with the aid of a walker. Patient lives in an apartment with 24-hour care. Patient with a history of  dementia and a such history was provided by patient's daughter and wife who are at bedside. Subjective: pt alert but with difficulty following commands consistently Assessment / Plan / Recommendation CHL IP CLINICAL IMPRESSIONS 06/04/2015 Therapy Diagnosis Severe pharyngeal phase dysphagia Clinical Impression Pt continues to have a severe pharyngeal dysphagia due to both structural component from cervical hardware as well as decreased overall strength from acute illness. Hardware from prior surgery impedes epiglottic deflection and bolus flow through the pharynx, with severe residue remaining in the valleculae with spoonfuls of nectar thick liquids. Residue is penetrated and ultimately aspirated after the swallow, particularly as it mixed with subsequent trials of thin liquids by spoon. Pt was able to expectorate aspirates with cued cough, but despite Mod-Max cues for coughing/throat clearing, pt was not able to clear residue from his pharynx, which was then repeatedly penetrated. Per review of previous MBS from 2015, this appears to be a chronic problem for which pt has compensated in the past by using a chin tuck. Unfortunately, pt is not cognitively able to complete this strategy and based on performance today, would not be safe for any PO intake. He would also be at a high risk for malnutrition/dehydration, as minimal mounts of POs even reached his UES. Given chronic nature of dysphagia as well as progressive nature of his dementia, pt may be appropriate for further discussions about comfort feeds. Impact on safety and function Severe aspiration risk;Risk for inadequate nutrition/hydration   CHL IP TREATMENT RECOMMENDATION 06/04/2015 Treatment Recommendations Therapy as outlined in treatment plan below   Prognosis 06/04/2015 Prognosis for Safe Diet Advancement Guarded Barriers to Reach Goals Cognitive deficits;Time post onset;Severity of deficits Barriers/Prognosis Comment -- CHL IP DIET RECOMMENDATION 06/04/2015 SLP  Diet Recommendations NPO Liquid Administration via -- Medication Administration Via alternative means Compensations -- Postural Changes --   CHL IP OTHER RECOMMENDATIONS 06/04/2015 Recommended Consults -- Oral Care Recommendations Oral care QID Other Recommendations --   CHL IP FOLLOW UP RECOMMENDATIONS 06/04/2015 Follow up Recommendations (No Data)   CHL IP FREQUENCY AND DURATION 06/04/2015 Speech Therapy Frequency (ACUTE ONLY) min 2x/week Treatment Duration 2 weeks      CHL IP ORAL PHASE 06/04/2015 Oral Phase WFL Oral - Pudding Teaspoon -- Oral -  Pudding Cup -- Oral - Honey Teaspoon -- Oral - Honey Cup -- Oral - Nectar Teaspoon -- Oral - Nectar Cup -- Oral - Nectar Straw -- Oral - Thin Teaspoon -- Oral - Thin Cup -- Oral - Thin Straw -- Oral - Puree -- Oral - Mech Soft -- Oral - Regular -- Oral - Multi-Consistency -- Oral - Pill -- Oral Phase - Comment --  CHL IP PHARYNGEAL PHASE 06/04/2015 Pharyngeal Phase Impaired Pharyngeal- Pudding Teaspoon -- Pharyngeal -- Pharyngeal- Pudding Cup -- Pharyngeal -- Pharyngeal- Honey Teaspoon -- Pharyngeal -- Pharyngeal- Honey Cup -- Pharyngeal -- Pharyngeal- Nectar Teaspoon Delayed swallow initiation-vallecula;Reduced epiglottic inversion;Reduced anterior laryngeal mobility;Reduced laryngeal elevation;Reduced airway/laryngeal closure;Reduced tongue base retraction;Penetration/Apiration after swallow;Pharyngeal residue - valleculae;Pharyngeal residue - cp segment;Compensatory strategies attempted (with notebox) Pharyngeal Material enters airway, passes BELOW cords without attempt by patient to eject out (silent aspiration) Pharyngeal- Nectar Cup -- Pharyngeal -- Pharyngeal- Nectar Straw -- Pharyngeal -- Pharyngeal- Thin Teaspoon Reduced epiglottic inversion;Reduced anterior laryngeal mobility;Reduced laryngeal elevation;Reduced airway/laryngeal closure;Reduced tongue base retraction;Pharyngeal residue - valleculae;Pharyngeal residue - cp segment;Compensatory strategies attempted (with  notebox);Penetration/Aspiration before swallow;Delayed swallow initiation-pyriform sinuses Pharyngeal Material enters airway, CONTACTS cords and not ejected out Pharyngeal- Thin Cup -- Pharyngeal -- Pharyngeal- Thin Straw -- Pharyngeal -- Pharyngeal- Puree -- Pharyngeal -- Pharyngeal- Mechanical Soft -- Pharyngeal -- Pharyngeal- Regular -- Pharyngeal -- Pharyngeal- Multi-consistency -- Pharyngeal -- Pharyngeal- Pill -- Pharyngeal -- Pharyngeal Comment --  CHL IP CERVICAL ESOPHAGEAL PHASE 06/04/2015 Cervical Esophageal Phase (No Data) Pudding Teaspoon -- Pudding Cup -- Honey Teaspoon -- Honey Cup -- Nectar Teaspoon -- Nectar Cup -- Nectar Straw -- Thin Teaspoon -- Thin Cup -- Thin Straw -- Puree -- Mechanical Soft -- Regular -- Multi-consistency -- Pill -- Cervical Esophageal Comment -- CHL IP GO 06/02/2015 Functional Assessment Tool Used (None) Functional Limitations Swallowing Swallow Current Status [id number]) CN Swallow Goal Status [id number]) CN Swallow Discharge Status [id number]) CN Motor Speech Current Status [id number]) (None) Motor Speech Goal Status [id number]) (None) Motor Speech Goal Status [id number]) (None) Spoken Language Comprehension Current Status [id number]) (None) Spoken Language Comprehension Goal Status [id number]) (None) Spoken Language Comprehension Discharge Status [id number]) (None) Spoken Language Expression Current Status [id number]) (None) Spoken Language Expression Goal Status [id number]) (None) Spoken Language Expression Discharge Status [id number]) (None) Attention Current Status [id number]) (None) Attention Goal Status [id number]) (None) Attention Discharge Status [id number]) (None) Memory Current Status [id number]) (None) Memory Goal Status [id number]) (None) Memory Discharge Status [id number]) (None) Voice Current Status [id number]) (None) Voice Goal Status [id number]) (None) Voice Discharge Status [id number]) (None) Other Speech-Language Pathology Functional Limitation [id number]) (None) Other Speech-Language Pathology Functional Limitation Goal Status [id number]) (None)  Other Speech-Language Pathology Functional Limitation Discharge Status [id number]) (None) Germain Osgood, M.A. CCC-SLP [phone] Germain Osgood 06/04/2015, 12:21 PM               Scheduled Meds: . antiseptic oral rinse  7 mL Mouth Rinse q12n4p  . arformoterol  15 mcg Nebulization BID  . aspirin EC  81 mg Oral Daily  . azithromycin  500 mg Intravenous Q24H  . budesonide (PULMICORT) nebulizer solution  0.25 mg Nebulization BID  . cefTRIAXone (ROCEPHIN)  IV  1 g Intravenous Q24H  . chlorhexidine  15 mL Mouth Rinse BID  . Chlorhexidine Gluconate Cloth  6 each Topical Q0600  . divalproex  500 mg Oral BID  . enoxaparin (LOVENOX) injection  40 mg Subcutaneous Daily  . feeding supplement (ENSURE ENLIVE)  237 mL Oral BID BM  . fluticasone  2 spray Each Nare  Daily  . gatifloxacin  1 drop Right Eye TID  . guaiFENesin  1,200 mg Oral BID  . loratadine  10 mg Oral Daily  . memantine  10 mg Oral BID  . mupirocin ointment  1 application Nasal BID  . pantoprazole  40 mg Oral Daily  . QUEtiapine  100 mg Oral QHS  . sertraline  100 mg Oral Daily   Continuous Infusions: . dextrose 5 % and 0.45 % NaCl with KCl 20 mEq/L 100 mL/hr at 06/03/15 1729    Time spent: > 35 minutes    Velvet Bathe  [facility] Pager [phone]. If 7PM-7AM, please contact night-coverage at [email or web address], password [facility] 06/04/2015, 4:56 PM  LOS: 2 days

## 2015-06-05 DIAGNOSIS — Z515 Encounter for palliative care: Secondary | ICD-10-CM | POA: Insufficient documentation

## 2015-06-05 DIAGNOSIS — Z7189 Other specified counseling: Secondary | ICD-10-CM | POA: Insufficient documentation

## 2015-06-05 MED ORDER — RESOURCE THICKENUP CLEAR PO POWD
ORAL | Status: DC | PRN
  Filled 2015-06-05: qty 125

## 2015-06-05 NOTE — Progress Notes (Signed)
Daily Progress Note   Patient Name: Jerry Kerr       Date: 06/05/2015 DOB: [birth year]  Age: 82 y.o. MRN#: [id number] Attending Physician: Velvet Bathe, MD Primary Care Physician: [name], MD Admit Date: 06/01/2015  Reason for Consultation/Follow-up: Establishing goals of care  Subjective: I met with Jerry Kerr daughter, Jerry Kerr, and his wife this morning.  We discussed comfort feeding and having SLP assist in developing safest diet that he would find agreeable in order to begin feeding for comfort. His therapist reviewed at length with family and will plan for starting diet with nectar thick clear liquids.  Length of Stay: 3 days  Current Medications: Scheduled Meds:  . antiseptic oral rinse  7 mL Mouth Rinse q12n4p  . arformoterol  15 mcg Nebulization BID  . aspirin EC  81 mg Oral Daily  . azithromycin  500 mg Intravenous Q24H  . budesonide (PULMICORT) nebulizer solution  0.25 mg Nebulization BID  . cefTRIAXone (ROCEPHIN)  IV  1 g Intravenous Q24H  . chlorhexidine  15 mL Mouth Rinse BID  . Chlorhexidine Gluconate Cloth  6 each Topical Q0600  . divalproex  500 mg Oral BID  . enoxaparin (LOVENOX) injection  40 mg Subcutaneous Daily  . feeding supplement (ENSURE ENLIVE)  237 mL Oral BID BM  . fluticasone  2 spray Each Nare Daily  . gatifloxacin  1 drop Right Eye TID  . guaiFENesin  1,200 mg Oral BID  . loratadine  10 mg Oral Daily  . memantine  10 mg Oral BID  . mupirocin ointment  1 application Nasal BID  . pantoprazole  40 mg Oral Daily  . QUEtiapine  100 mg Oral QHS  . sertraline  100 mg Oral Daily    Continuous Infusions: . dextrose 5 % and 0.45 % NaCl with KCl 20 mEq/L 100 mL/hr at 06/05/15 1419    PRN Meds: ipratropium, levalbuterol, RESOURCE THICKENUP CLEAR,  traMADol  Physical Exam: Physical Exam             General: Alert, awake, but more withdrawn today. In no acute distress.  HEENT: No bruits, no goiter, no JVD Heart: Regular rate and rhythm. No murmur appreciated. Lungs: Fair air movement Abdomen: Soft, nontender, nondistended, positive bowel sounds.  Ext: No significant edema Skin: Warm and dry  Vital Signs:  BP 133/85 mmHg  Pulse 77  Temp(Src) 97.2 F (36.2 C) (Oral)  Resp 18  Ht '5\' 9"'  (1.753 m)  Wt 63.2 kg (139 lb 5.3 oz)  BMI 20.57 kg/m2  SpO2 96% SpO2: SpO2: 96 % O2 Device: O2 Device: Nasal Cannula O2 Flow Rate: O2 Flow Rate (L/min): 2 L/min  Intake/output summary:   Intake/Output Summary (Last 24 hours) at 06/05/15 1837 Last data filed at 06/05/15 1818  Gross per 24 hour  Intake   5000 ml  Output   1000 ml  Net   4000 ml   LBM: Last BM Date: 06/03/15 Baseline Weight: Weight: 63.504 kg (140 lb) Most recent weight: Weight: 63.2 kg (139 lb 5.3 oz)       Palliative Assessment/Data: Flowsheet Rows        Most Recent Value   Intake Tab    Referral Department  Hospitalist   Unit at Time of Referral  Med/Surg Unit   Palliative Care Primary Diagnosis  Neurology   Date Notified  06/02/15   Palliative Care Type  New Palliative care   Reason for referral  Clarify Goals of Care   Date of Admission  06/02/15   Date first seen by Palliative Care  06/03/15   # of days Palliative referral response time  1 Day(s)   # of days IP prior to Palliative referral  0   Clinical Assessment    Palliative Performance Scale Score  50%   Pain Max last 24 hours  3   Pain Min Last 24 hours  0   Psychosocial & Spiritual Assessment    Palliative Care Outcomes    Patient/Family meeting held?  Yes   Who was at the meeting?  Patients daughter/poa   Palliative Care Outcomes  Clarified goals of care, Changed CPR status      Additional Data Reviewed: CBC    Component Value Date/Time   WBC 4.9 06/02/2015 0247   RBC 3.66* 06/02/2015  0247   HGB 9.8* 06/02/2015 0247   HCT 32.7* 06/02/2015 0247   PLT 124* 06/02/2015 0247   MCV 89.3 06/02/2015 0247   MCH 26.8 06/02/2015 0247   MCHC 30.0 06/02/2015 0247   RDW 16.7* 06/02/2015 0247   LYMPHSABS 1.1 06/02/2015 0247   MONOABS 0.5 06/02/2015 0247   EOSABS 0.1 06/02/2015 0247   BASOSABS 0.0 06/02/2015 0247    CMP     Component Value Date/Time   NA 138 06/02/2015 0247   K 4.0 06/02/2015 0247   CL 105 06/02/2015 0247   CO2 23 06/02/2015 0247   GLUCOSE 115* 06/02/2015 0247   BUN 13 06/02/2015 0247   CREATININE 0.95 06/02/2015 0247   CALCIUM 8.3* 06/02/2015 0247   PROT 6.0* 06/02/2015 0247   ALBUMIN 2.7* 06/02/2015 0247   AST 23 06/02/2015 0247   ALT 8* 06/02/2015 0247   ALKPHOS 98 06/02/2015 0247   BILITOT 0.7 06/02/2015 0247   GFRNONAA >60 06/02/2015 0247   GFRAA >60 06/02/2015 0247       Problem List:  Patient Active Problem List   Diagnosis Date Noted  . CAP (community acquired pneumonia) 06/02/2015  . FTT (failure to thrive) in adult 06/02/2015  . SIRS (systemic inflammatory response syndrome) ([place]) 06/02/2015  . Protein-calorie malnutrition, severe 06/02/2015  . Dementia with behavioral disturbance 12/24/2014  . Substance abuse 12/24/2014  . H/O personality disorder 12/24/2014  . Chronic diastolic CHF (congestive heart failure) ([place]) 07/02/2014  . Atrial fibrillation ([place]) 01/01/2014  . Tachy-brady syndrome ([place])  01/01/2014  . Exertional dyspnea 01/01/2014  . Acute encephalopathy 12/12/2013  . Dehydration 12/12/2013  . Fall 12/12/2013  . Dysphagia, unspecified(787.20) 12/12/2013  . Compression fracture of L2 ([place]) 12/12/2013  . Near syncope 11/07/2013  . Dementia 11/07/2013  . Esophageal dysphagia 06/20/2013  . Esophageal ring 06/20/2013  . GERD (gastroesophageal reflux disease) 06/20/2013  . History of adenomatous polyp of colon 06/20/2013  . Alzheimer's dementia with behavioral disturbance 03/08/2013  . AA (alcohol abuse) 03/08/2013  .  Absolute anemia 03/08/2013  . CAFL (chronic airflow limitation) ([place]) 03/08/2013  . Abnormal LFTs 03/08/2013  . Family history of colonic polyps 03/08/2013  . A-fib ([place]) 03/08/2013  . H/O malignant neoplasm of prostate 03/08/2013  . BP (high blood pressure) 03/08/2013  . HLD (hyperlipidemia) 03/08/2013  . Hypomagnesemia 03/08/2013  . Osteopenia 03/08/2013  . Artificial cardiac pacemaker 03/08/2013  . Multiple rib fractures 03/24/2012  . Fall down steps 03/24/2012  . Altered mental status 03/24/2012  . COPD (chronic obstructive pulmonary disease) ([place]) 03/24/2012  . Hypertension 03/24/2012  . Spinal stenosis of lumbar region with radiculopathy 03/24/2012  . Fracture of lumbar spine ([place]) 10/18/2007  . Incarcerated incisional hernia 03/27/2007     Palliative Care Assessment & Plan    1.Code Status:  DNR    Code Status Orders        Start     Ordered   06/03/15 1923  Do not attempt resuscitation (DNR)   Continuous    Question Answer Comment  In the event of cardiac or respiratory ARREST Do not call a "code blue"   In the event of cardiac or respiratory ARREST Do not perform Intubation, CPR, defibrillation or ACLS   In the event of cardiac or respiratory ARREST Use medication by any route, position, wound care, and other measures to relive pain and suffering. May use oxygen, suction and manual treatment of airway obstruction as needed for comfort.      06/03/15 1922    Code Status History    Date Active Date Inactive Code Status Order ID Comments User Context   06/02/2015  1:59 AM 06/03/2015  7:22 PM Full Code [id number]  Eugenie Filler, MD Inpatient   12/12/2013 10:30 AM 12/16/2013  4:22 PM DNR [id number]  Eugenie Filler, MD Inpatient   11/08/2013 12:03 AM 11/09/2013  6:27 PM Full Code [id number]  Allyne Gee, MD Inpatient   03/24/2012  2:56 AM 03/24/2012  4:32 PM Full Code [id number]  Theressa Millard, MD Inpatient    Advance Directive Documentation        Most Recent  Value   Type of Advance Directive  Living will   Pre-existing out of facility DNR order (yellow form or pink MOST form)     "MOST" Form in Place?         2. Goals of Care/Additional Recommendations:  Patient with current PNA and recurrent aspiration risk.  Speech Therapy and I spoke with his family about developing plan for comfort feeding while understanding that he has chronic aspiration and that this is something that will continue to occur in the future.  Plan to initiate modified diet today.  Will start with nectar thick liquids.  If he continues to acutely aspirate, his prognosis will be limited and I discussed again about pathway for comfort care with family today.  If he does not acutely aspirate, his prognosis is still limited as this will continue to be a problem in the future.  His family is hopeful  he will be able to regain some functional status with rehab and is hopeful he will improve enough to discharge to SNF for rehab.  I spoke with them about the fact that he would benefit from enrollment in hospice if he does not continue to improve and it appears that we are trying to push his body in a way that he is in not able to go.  I think that his course will become more clear now that he is feeding for comfort despite aspiration risk.  If he is unable to maintain enough nutrition, or if he continues to acutely aspirate, will need to consider refocusing care toward quality of life.  In this case, I recommended to the family to consider enrollment in hospice.  Another provider from our team will f/u with patient and family tomorrow.  Limitations on Scope of Treatment: No Artificial Feeding  Psycho-social Needs: Caregiving  Support/Resources  3. Symptom Management:       1.Dysphagia: chronic aspiration risk.  Discussed development of diet for comfort with SLP.  Appreciate SLP assistance.  2. Confusion: Continue txt of underlying infection.  4. Palliative Prophylaxis:    Aspiration, Bowel Regimen, Delirium Protocol and Frequent Pain Assessment  5. Prognosis: Unable to determine  6. Discharge Planning:  To be determined   Care plan was discussed with patient's family, including her daughter, and SLP  Thank you for allowing the Palliative Medicine Team to assist in the care of this patient.   Time In: 0900 Time Out: 0950 Total Time 50 Prolonged Time Billed no        Micheline Rough, MD  06/05/2015, 6:37 PM  Please contact Palliative Medicine Team phone at [phone] for questions and concerns.

## 2015-06-05 NOTE — Progress Notes (Signed)
[facility] PROGRESS NOTE  Jerry Kerr [id number] DOB: 1933-08-10 DOA: 06/01/2015 PCP: [name], MD  Brief narrative: Patient is a 82 year old with history of Alzheimer's dementia, COPD, hypertension, status post permanent pacemaker, depression/anxiety, GERD, chronic diastolic heart failure off diuretics, history of atrial fibrillation. Was admitted to the hospital with shortness of breath and worsening confusion. Patient was found on speech evaluation to have severe dysphagia and palliative care has been following. Currently plans are to transition to residential hospice if patient is not able to tolerate by mouth intake and/or sustain enough intake to support daily requirements.  Assessment/Plan: Principal Problem:   CAP (community acquired pneumonia) - continue current antibiotic regimen - Most likely secondary to aspiration given severe dysphasia by multiple evaluations by speech therapy - Currently trying nectar thick liquids  Dysphagia - Speech therapy recommendations reports severe dysphagia - Discussed with family who would like to or is leaning more towards avoiding PEG tube placement. In lieu of current findings most likely patient will be to transition to residential hospice - Continue maintenance IV fluid  Active Problems:   Fall down steps - Physical therapy evaluation    COPD (chronic obstructive pulmonary disease) (HCC) - Continue current medication regimen, stable    Hypertension   GERD (gastroesophageal reflux disease) - Stable on Protonix   Dementia - Continue current medication regimen, may also be contributing to dysphagia   Dehydration - Continue maintenance IV fluid   Fall   Atrial fibrillation (HCC)   HLD (hyperlipidemia)   Chronic diastolic CHF (congestive heart failure) (HCC)   Protein-calorie malnutrition, severe - Please see discussion above  Code Status: Full Family Communication: None at bedside Disposition Plan: Most likely  residential hospice placement   Consultants:  Palliative  Procedures:  None  Antibiotics:  Azithromycin  Rocephin   HPI/Subjective: Patient still has been having trouble with swallowing according to family.  Objective: Filed Vitals:   06/05/15 0806 06/05/15 1642  BP: 149/78 133/85  Pulse: 72 77  Temp: 99.3 F (37.4 C) 97.2 F (36.2 C)  Resp: 17 18    Intake/Output Summary (Last 24 hours) at 06/05/15 1823 Last data filed at 06/05/15 1818  Gross per 24 hour  Intake   5000 ml  Output   1000 ml  Net   4000 ml   Filed Weights   06/01/15 1824 06/02/15 2100 06/03/15 2130  Weight: 63.504 kg (140 lb) 63.8 kg (140 lb 10.5 oz) 63.2 kg (139 lb 5.3 oz)    Exam:   General:  Pt in nad, alert and awake  Cardiovascular: s1 and s2 wnl, no rubs  Respiratory: no wheezes, equal chest rise, rales over right lung field  Abdomen: soft, nt, nd  Musculoskeletal: no cyanosis or clubbing   Data Reviewed: Basic Metabolic Panel:  Recent Labs Lab 06/01/15 1845 06/02/15 0247  NA 140 138  K 4.7 4.0  CL 99* 105  CO2 25 23  GLUCOSE 89 115*  BUN 12 13  CREATININE 1.09 0.95  CALCIUM 9.7 8.3*  MG  --  1.7   Liver Function Tests:  Recent Labs Lab 06/01/15 1845 06/02/15 0247  AST 27 23  ALT 9* 8*  ALKPHOS 119 98  BILITOT 0.7 0.7  PROT 7.1 6.0*  ALBUMIN 3.3* 2.7*   No results for input(s): LIPASE, AMYLASE in the last 168 hours. No results for input(s): AMMONIA in the last 168 hours. CBC:  Recent Labs Lab 06/01/15 2110 06/02/15 0247  WBC 6.4 4.9  NEUTROABS 4.2 3.2  HGB 11.8* 9.8*  HCT 39.0 32.7*  MCV 89.7 89.3  PLT 153 124*   Cardiac Enzymes: No results for input(s): CKTOTAL, CKMB, CKMBINDEX, TROPONINI in the last 168 hours. BNP (last 3 results)  Recent Labs  06/01/15 2241  BNP 231.1*    ProBNP (last 3 results)  Recent Labs  07/07/14 1025  PROBNP 274.0*    CBG:  Recent Labs Lab 06/01/15 1837 06/03/15 0719  GLUCAP 73 91    Recent  Results (from the past 240 hour(s))  Culture, blood (routine x 2)     Status: None (Preliminary result)   Collection Time: 06/01/15  6:25 PM  Result Value Ref Range Status   Specimen Description BLOOD RIGHT ANTECUBITAL  Final   Special Requests BOTTLES DRAWN AEROBIC ONLY 10CC  Final   Culture NO GROWTH 4 DAYS  Final   Report Status PENDING  Incomplete  Culture, blood (routine x 2)     Status: None (Preliminary result)   Collection Time: 06/01/15  9:11 PM  Result Value Ref Range Status   Specimen Description BLOOD LEFT ANTECUBITAL  Final   Special Requests BOTTLES DRAWN AEROBIC AND ANAEROBIC 5CC  Final   Culture NO GROWTH 4 DAYS  Final   Report Status PENDING  Incomplete  Urine culture     Status: None   Collection Time: 06/01/15  9:54 PM  Result Value Ref Range Status   Specimen Description URINE, CATHETERIZED  Final   Special Requests NONE  Final   Culture NO GROWTH 2 DAYS  Final   Report Status 06/03/2015 FINAL  Final  Respiratory virus panel     Status: Abnormal   Collection Time: 06/02/15  2:12 AM  Result Value Ref Range Status   Respiratory Syncytial Virus A Negative Negative Final   Respiratory Syncytial Virus B Negative Negative Final   Influenza A Negative Negative Final   Influenza B Negative Negative Final   Parainfluenza 1 Negative Negative Final   Parainfluenza 2 Negative Negative Final   Parainfluenza 3 Negative Negative Final   Metapneumovirus Positive (A) Negative Final   Rhinovirus Negative Negative Final   Adenovirus Negative Negative Final    Comment: (NOTE) Performed At: [facility] [place], [place] [id number] Lindon Romp MD [id number]   MRSA PCR Screening     Status: Abnormal   Collection Time: 06/02/15  2:18 AM  Result Value Ref Range Status   MRSA by PCR POSITIVE (A) NEGATIVE Final    Comment:        The GeneXpert MRSA Assay (FDA approved for NASAL specimens only), is one component of a comprehensive MRSA  colonization surveillance program. It is not intended to diagnose MRSA infection nor to guide or monitor treatment for MRSA infections. RESULT CALLED TO, READ BACK BY AND VERIFIED WITH: V BROWN,RN @0501  06/02/15 MKELLY   Culture, sputum-assessment     Status: None   Collection Time: 06/02/15  2:34 AM  Result Value Ref Range Status   Specimen Description EXPECTORATED SPUTUM  Final   Special Requests NONE  Final   Sputum evaluation   Final    MICROSCOPIC FINDINGS SUGGEST THAT THIS SPECIMEN IS NOT REPRESENTATIVE OF LOWER RESPIRATORY SECRETIONS. PLEASE RECOLLECT. Gram Stain Report Called to,Read Back By and Verified With: V BROWN,RN @0453  06/02/15 MKELLY    Report Status 06/02/2015 FINAL  Final  Culture, blood (routine x 2) Call MD if unable to obtain prior to antibiotics being given     Status: None (Preliminary result)  Collection Time: 06/02/15  2:47 AM  Result Value Ref Range Status   Specimen Description BLOOD RIGHT ANTECUBITAL  Final   Special Requests BOTTLES DRAWN AEROBIC AND ANAEROBIC 10CC  Final   Culture NO GROWTH 3 DAYS  Final   Report Status PENDING  Incomplete  Culture, blood (routine x 2) Call MD if unable to obtain prior to antibiotics being given     Status: None (Preliminary result)   Collection Time: 06/02/15  2:53 AM  Result Value Ref Range Status   Specimen Description BLOOD RIGHT HAND  Final   Special Requests BOTTLES DRAWN AEROBIC ONLY [place]  Final   Culture NO GROWTH 3 DAYS  Final   Report Status PENDING  Incomplete     Studies: Dg Swallowing Func-speech Pathology  06/04/2015  Objective Swallowing Evaluation: Type of Study: MBS-Modified Barium Swallow Study Patient Details Name: Jerry Kerr MRN: [id number] Date of Birth: October 10, 1933 Today's Date: 06/04/2015 Time: SLP Start Time (ACUTE ONLY): 1138-SLP Stop Time (ACUTE ONLY): 1147 SLP Time Calculation (min) (ACUTE ONLY): 9 min Past Medical History: Past Medical History Diagnosis Date . Asthma  . Dementia  . Back pain   . COPD (chronic obstructive pulmonary disease) ([place])  . Hypertension  . Pacemaker  . Anxiety  . Depression  . Cancer (HCC)    SKIN, PROSTATE . GERD (gastroesophageal reflux disease)  . Substance abuse    ETOH . Dysphagia, unspecified(787.20) 12/12/2013 . Swallowing difficulty    SEES  DR. PYRTLE HAS HX ESOPHAGEAL DIL . Barrett's esophagus  . CHF (congestive heart failure) (HCC)    chronic diastolic HF . Dysrhythmia    atrial fibrillation Past Surgical History: Past Surgical History Procedure Laterality Date . Neck surgery   . Upper gastrointestinal endoscopy   . Colonoscopy   . Ankle surgery   . Skin graft   . Pacemaker insertion   . Tonsillectomy   . Esophageal dilation   . Kyphoplasty Bilateral 05/05/2014   Procedure: KYPHOPLASTY LUMBAR TWO;  Surgeon: Consuella Lose, MD;  Location: [facility];  Service: Neurosurgery;  Laterality: Bilateral; HPI: With history of Alzheimer's dementia, COPD, hypertension, status post permanent pacemaker, depression/anxiety, gastroesophageal reflux disease, chronic diastolic heart failure off diuretics will approximate 6 months per family, history of atrial fibrillation presented to the ED with worsening shortness of breath, worsening confusion, decreased oral intake, generalized weakness to the point whereby patient is no longer ambulating used to ambulate with the aid of a walker. Patient lives in an apartment with 24-hour care. Patient with a history of dementia and a such history was provided by patient's daughter and wife who are at bedside. Subjective: pt alert but with difficulty following commands consistently Assessment / Plan / Recommendation CHL IP CLINICAL IMPRESSIONS 06/04/2015 Therapy Diagnosis Severe pharyngeal phase dysphagia Clinical Impression Pt continues to have a severe pharyngeal dysphagia due to both structural component from cervical hardware as well as decreased overall strength from acute illness. Hardware from prior surgery impedes epiglottic deflection and  bolus flow through the pharynx, with severe residue remaining in the valleculae with spoonfuls of nectar thick liquids. Residue is penetrated and ultimately aspirated after the swallow, particularly as it mixed with subsequent trials of thin liquids by spoon. Pt was able to expectorate aspirates with cued cough, but despite Mod-Max cues for coughing/throat clearing, pt was not able to clear residue from his pharynx, which was then repeatedly penetrated. Per review of previous MBS from 2015, this appears to be a chronic problem for which pt has compensated  in the past by using a chin tuck. Unfortunately, pt is not cognitively able to complete this strategy and based on performance today, would not be safe for any PO intake. He would also be at a high risk for malnutrition/dehydration, as minimal mounts of POs even reached his UES. Given chronic nature of dysphagia as well as progressive nature of his dementia, pt may be appropriate for further discussions about comfort feeds. Impact on safety and function Severe aspiration risk;Risk for inadequate nutrition/hydration   CHL IP TREATMENT RECOMMENDATION 06/04/2015 Treatment Recommendations Therapy as outlined in treatment plan below   Prognosis 06/04/2015 Prognosis for Safe Diet Advancement Guarded Barriers to Reach Goals Cognitive deficits;Time post onset;Severity of deficits Barriers/Prognosis Comment -- CHL IP DIET RECOMMENDATION 06/04/2015 SLP Diet Recommendations NPO Liquid Administration via -- Medication Administration Via alternative means Compensations -- Postural Changes --   CHL IP OTHER RECOMMENDATIONS 06/04/2015 Recommended Consults -- Oral Care Recommendations Oral care QID Other Recommendations --   CHL IP FOLLOW UP RECOMMENDATIONS 06/04/2015 Follow up Recommendations (No Data)   CHL IP FREQUENCY AND DURATION 06/04/2015 Speech Therapy Frequency (ACUTE ONLY) min 2x/week Treatment Duration 2 weeks      CHL IP ORAL PHASE 06/04/2015 Oral Phase WFL Oral - Pudding Teaspoon  -- Oral - Pudding Cup -- Oral - Honey Teaspoon -- Oral - Honey Cup -- Oral - Nectar Teaspoon -- Oral - Nectar Cup -- Oral - Nectar Straw -- Oral - Thin Teaspoon -- Oral - Thin Cup -- Oral - Thin Straw -- Oral - Puree -- Oral - Mech Soft -- Oral - Regular -- Oral - Multi-Consistency -- Oral - Pill -- Oral Phase - Comment --  CHL IP PHARYNGEAL PHASE 06/04/2015 Pharyngeal Phase Impaired Pharyngeal- Pudding Teaspoon -- Pharyngeal -- Pharyngeal- Pudding Cup -- Pharyngeal -- Pharyngeal- Honey Teaspoon -- Pharyngeal -- Pharyngeal- Honey Cup -- Pharyngeal -- Pharyngeal- Nectar Teaspoon Delayed swallow initiation-vallecula;Reduced epiglottic inversion;Reduced anterior laryngeal mobility;Reduced laryngeal elevation;Reduced airway/laryngeal closure;Reduced tongue base retraction;Penetration/Apiration after swallow;Pharyngeal residue - valleculae;Pharyngeal residue - cp segment;Compensatory strategies attempted (with notebox) Pharyngeal Material enters airway, passes BELOW cords without attempt by patient to eject out (silent aspiration) Pharyngeal- Nectar Cup -- Pharyngeal -- Pharyngeal- Nectar Straw -- Pharyngeal -- Pharyngeal- Thin Teaspoon Reduced epiglottic inversion;Reduced anterior laryngeal mobility;Reduced laryngeal elevation;Reduced airway/laryngeal closure;Reduced tongue base retraction;Pharyngeal residue - valleculae;Pharyngeal residue - cp segment;Compensatory strategies attempted (with notebox);Penetration/Aspiration before swallow;Delayed swallow initiation-pyriform sinuses Pharyngeal Material enters airway, CONTACTS cords and not ejected out Pharyngeal- Thin Cup -- Pharyngeal -- Pharyngeal- Thin Straw -- Pharyngeal -- Pharyngeal- Puree -- Pharyngeal -- Pharyngeal- Mechanical Soft -- Pharyngeal -- Pharyngeal- Regular -- Pharyngeal -- Pharyngeal- Multi-consistency -- Pharyngeal -- Pharyngeal- Pill -- Pharyngeal -- Pharyngeal Comment --  CHL IP CERVICAL ESOPHAGEAL PHASE 06/04/2015 Cervical Esophageal Phase (No Data)  Pudding Teaspoon -- Pudding Cup -- Honey Teaspoon -- Honey Cup -- Nectar Teaspoon -- Nectar Cup -- Nectar Straw -- Thin Teaspoon -- Thin Cup -- Thin Straw -- Puree -- Mechanical Soft -- Regular -- Multi-consistency -- Pill -- Cervical Esophageal Comment -- CHL IP GO 06/02/2015 Functional Assessment Tool Used (None) Functional Limitations Swallowing Swallow Current Status [id number]) CN Swallow Goal Status [id number]) CN Swallow Discharge Status [id number]) CN Motor Speech Current Status [id number]) (None) Motor Speech Goal Status [id number]) (None) Motor Speech Goal Status [id number]) (None) Spoken Language Comprehension Current Status [id number]) (None) Spoken Language Comprehension Goal Status [id number]) (None) Spoken Language Comprehension Discharge Status [id number]) (None) Spoken Language Expression Current Status [id number]) (None) Spoken Language Expression Goal Status [id number]) (None)  Spoken Language Expression Discharge Status [phone]) (None) Attention Current Status [phone]) (None) Attention Goal Status [id number]) (None) Attention Discharge Status [phone]) (None) Memory Current Status [id number]) (None) Memory Goal Status [id number]) (None) Memory Discharge Status [id number]) (None) Voice Current Status [id number]) (None) Voice Goal Status [id number]) (None) Voice Discharge Status [id number]) (None) Other Speech-Language Pathology Functional Limitation [phone]) (None) Other Speech-Language Pathology Functional Limitation Goal Status [id number]) (None) Other Speech-Language Pathology Functional Limitation Discharge Status [phone]) (None) Germain Osgood, M.A. CCC-SLP [phone] Germain Osgood 06/04/2015, 12:21 PM               Scheduled Meds: . antiseptic oral rinse  7 mL Mouth Rinse q12n4p  . arformoterol  15 mcg Nebulization BID  . aspirin EC  81 mg Oral Daily  . azithromycin  500 mg Intravenous Q24H  . budesonide (PULMICORT) nebulizer solution  0.25 mg Nebulization BID  . cefTRIAXone (ROCEPHIN)  IV  1 g Intravenous Q24H  . chlorhexidine  15 mL Mouth Rinse BID  .  Chlorhexidine Gluconate Cloth  6 each Topical Q0600  . divalproex  500 mg Oral BID  . enoxaparin (LOVENOX) injection  40 mg Subcutaneous Daily  . feeding supplement (ENSURE ENLIVE)  237 mL Oral BID BM  . fluticasone  2 spray Each Nare Daily  . gatifloxacin  1 drop Right Eye TID  . guaiFENesin  1,200 mg Oral BID  . loratadine  10 mg Oral Daily  . memantine  10 mg Oral BID  . mupirocin ointment  1 application Nasal BID  . pantoprazole  40 mg Oral Daily  . QUEtiapine  100 mg Oral QHS  . sertraline  100 mg Oral Daily   Continuous Infusions: . dextrose 5 % and 0.45 % NaCl with KCl 20 mEq/L 100 mL/hr at 06/05/15 1419    Time spent: > 35 minutes    Velvet Bathe  [facility] Pager [phone]. If 7PM-7AM, please contact night-coverage at [email or web address], password [facility] 06/05/2015, 6:23 PM  LOS: 3 days

## 2015-06-05 NOTE — Care Management Important Message (Signed)
Important Message  Patient Details  Name: Jerry Kerr MRN: [id number] Date of Birth: [birth year]   Medicare Important Message Given:  Yes    Federico Maiorino, Rory Percy, RN 06/05/2015, 12:02 PM

## 2015-06-05 NOTE — Progress Notes (Signed)
Speech Language Pathology Treatment: Dysphagia  Patient Details Name: Maclain Cohron MRN: [id number] DOB: 09-Dec-1933 Today's Date: 06/05/2015 Time: 1594-5859 SLP Time Calculation (min) (ACUTE ONLY): 37 min  Assessment / Plan / Recommendation Clinical Impression  SLP met initially with pt's wife and daughter to review information from [facility] on previous date, utilizing imaging from testing for better understanding. Reviewed the chronic element of his dysphagia, as well as the more progressive nature of his dementia and the acute impact from current hospitalization that can impact his body's ability to tolerate some aspiration. Daughter also voiced her concern about whether they were trying to get him to eat because THEY want him to, or because HE wants to, as he has not been requesting much. At this point in time, they would like him to be able to try some POs, acknowledging the risks of aspiration, in order to see how much he and his body can handle. They are aware that there is the possibility for an acute decline, but they would like to be able to offer him some POs to see if he is willing/wanting to take any, how much he will take in, and if some nutrition can help with his overall strength. Given their current wishes, they are agreeable to trial spoonfuls of nectar thick liquids - which were not immediately aspirated on the exam until subsequent thin liquid trials were offered (severe residue did remain after the swallow). I reiterated to them that this gives them the option to offer some POs, but that we will have to see how much the pt is able/willing to take in. Adequate nutrition/hydration may realistically be a concern as well.   SLP then offered some PO trials to the pt - with moderate encouragement from his family he took in three spoonfuls of nectar-thick apple juice. Delayed throat clearing noted, although without wet vocal quality. He declined further trials at this time, but did state that he  enjoys pepsi. Will initiate nectar thick diet and request for can of thickener so that family can thicken whichever liquids the pt may be wanting. Will continue to follow and assist as able.   HPI HPI: With history of Alzheimer's dementia, COPD, hypertension, status post permanent pacemaker, depression/anxiety, gastroesophageal reflux disease, chronic diastolic heart failure off diuretics will approximate 6 months per family, history of atrial fibrillation presented to the ED with worsening shortness of breath, worsening confusion, decreased oral intake, generalized weakness to the point whereby patient is no longer ambulating used to ambulate with the aid of a walker. Patient lives in an apartment with 24-hour care. Patient with a history of dementia and a such history was provided by patient's daughter and wife who are at bedside.      SLP Plan  Continue with current plan of care     Recommendations  Diet recommendations: Nectar-thick liquid Liquids provided via: Teaspoon Medication Administration: Via alternative means Supervision: Staff to assist with self feeding;Full supervision/cueing for compensatory strategies Compensations: Slow rate;Small sips/bites;Multiple dry swallows after each bite/sip Postural Changes and/or Swallow Maneuvers: Seated upright 90 degrees;Upright 30-60 min after meal             Oral Care Recommendations: Oral care QID Follow up Recommendations:  (tba) Plan: Continue with current plan of care     GO               Germain Osgood, M.A. CCC-SLP [phone]  Germain Osgood 06/05/2015, 12:23 PM

## 2015-06-06 DIAGNOSIS — J189 Pneumonia, unspecified organism: Secondary | ICD-10-CM

## 2015-06-06 DIAGNOSIS — R627 Adult failure to thrive: Secondary | ICD-10-CM

## 2015-06-06 DIAGNOSIS — Z515 Encounter for palliative care: Secondary | ICD-10-CM | POA: Insufficient documentation

## 2015-06-06 DIAGNOSIS — R4182 Altered mental status, unspecified: Secondary | ICD-10-CM

## 2015-06-06 DIAGNOSIS — Z7189 Other specified counseling: Secondary | ICD-10-CM

## 2015-06-06 LAB — CULTURE, BLOOD (ROUTINE X 2)
Culture: NO GROWTH
Culture: NO GROWTH

## 2015-06-06 MED ORDER — GLYCOPYRROLATE 0.2 MG/ML IJ SOLN: 0.4000 mg | INTRAMUSCULAR | Status: DC | PRN

## 2015-06-06 MED ORDER — VALPROATE SODIUM 500 MG/5ML IV SOLN
500.0000 mg | Freq: Two times a day (BID) | INTRAVENOUS | Status: DC
  Administered 2015-06-06 – 2015-06-08 (×4): 500 mg via INTRAVENOUS
  Filled 2015-06-06 (×5): qty 5

## 2015-06-06 MED ORDER — KCL IN DEXTROSE-NACL 20-5-0.45 MEQ/L-%-% IV SOLN
INTRAVENOUS | Status: DC
  Administered 2015-06-06 – 2015-06-07 (×2): via INTRAVENOUS
  Filled 2015-06-06 (×3): qty 1000

## 2015-06-06 NOTE — Progress Notes (Signed)
Attempted to give patient crushed meds with pudding and after 2-3 bites patient began to cough, turn red, and wheeze. Mouth was suctioned using yankauer, but pt continued to cough. Respiratory paged and patient was suctioned through his nose. Dr. Eliseo Squires notified. Family instructed to limit food/fluid intake unless patient specifically requested due to VERY HIGH risk of aspiration. Will monitor.  Joellen Jersey, RN.

## 2015-06-06 NOTE — Progress Notes (Signed)
Daily Progress Note   Patient Name: Jerry Kerr       Date: 06/06/2015 DOB: 01-01-1934  Age: 82 y.o. MRN#: [id number] Attending Physician: Geradine Girt, DO Primary Care Physician: [name], MD Admit Date: 06/01/2015  Reason for Consultation/Follow-up: Establishing goals of care  Subjective: I met with Jerry Kerr daughter, Jerry Kerr,  this morning.  We discussed comfort feeding and have been notified that the patient continues to have choking/aspiration problems. Patient has no desire for food. Patient denies any hunger or thirst.  Patient's daughter is tearful but states that she would not want the patient to have a PEG tube. She would not want to force the patient into eating as he is having aspiration problems. She wishes to keep him as comfortable as possible. She wishes to pursue hospice consultation for possible transfer to residential hospice towards the end of this hospitalization.  Length of Stay: 4 days  Current Medications: Scheduled Meds:  . antiseptic oral rinse  7 mL Mouth Rinse q12n4p  . arformoterol  15 mcg Nebulization BID  . azithromycin  500 mg Intravenous Q24H  . budesonide (PULMICORT) nebulizer solution  0.25 mg Nebulization BID  . cefTRIAXone (ROCEPHIN)  IV  1 g Intravenous Q24H  . Chlorhexidine Gluconate Cloth  6 each Topical Q0600  . divalproex  500 mg Oral BID  . enoxaparin (LOVENOX) injection  40 mg Subcutaneous Daily  . fluticasone  2 spray Each Nare Daily  . gatifloxacin  1 drop Right Eye TID  . mupirocin ointment  1 application Nasal BID    Continuous Infusions:    PRN Meds: ipratropium, levalbuterol, RESOURCE THICKENUP CLEAR  Physical Exam: Physical Exam             General: Alert, awake,  having a coughing spell. Being suctioned.  HEENT:  No bruits, no goiter, no JVD Heart: Regular rate and rhythm. No murmur appreciated. Lungs: Fair air movement Abdomen: Soft, nontender, nondistended, positive bowel sounds.  Ext: No significant edema Skin: Warm and dry  Vital Signs: BP 153/87 mmHg  Pulse 85  Temp(Src) 97.9 F (36.6 C) (Axillary)  Resp 16  Ht _0  (1.753 m)  Wt 63.1 kg (139 lb 1.8 oz)  BMI 20.53 kg/m2  SpO2 97% SpO2: SpO2: 97 % O2 Device: O2 Device: Nasal Cannula O2 Flow Rate:  O2 Flow Rate (L/min): 2 L/min  Intake/output summary:   Intake/Output Summary (Last 24 hours) at 06/06/15 1331 Last data filed at 06/06/15 1009  Gross per 24 hour  Intake   2005 ml  Output   1725 ml  Net    280 ml   LBM: Last BM Date: 06/05/15 Baseline Weight: Weight: 63.504 kg (140 lb) Most recent weight: Weight: 63.1 kg (139 lb 1.8 oz)       Palliative Assessment/Data: Flowsheet Rows        Most Recent Value   Intake Tab    Referral Department  Hospitalist   Unit at Time of Referral  Med/Surg Unit   Palliative Care Primary Diagnosis  Neurology   Date Notified  06/02/15   Palliative Care Type  New Palliative care   Reason for referral  Clarify Goals of Care   Date of Admission  06/02/15   Date first seen by Palliative Care  06/03/15   # of days Palliative referral response time  1 Day(s)   # of days IP prior to Palliative referral  0   Clinical Assessment    Palliative Performance Scale Score  50%   Pain Max last 24 hours  3   Pain Min Last 24 hours  0   Psychosocial & Spiritual Assessment    Palliative Care Outcomes    Patient/Family meeting held?  Yes   Who was at the meeting?  Patients daughter/poa   Palliative Care Outcomes  Clarified goals of care, Changed CPR status      Additional Data Reviewed: CBC    Component Value Date/Time   WBC 4.9 06/02/2015 0247   RBC 3.66* 06/02/2015 0247   HGB 9.8* 06/02/2015 0247   HCT 32.7* 06/02/2015 0247   PLT 124* 06/02/2015 0247   MCV 89.3 06/02/2015 0247   MCH 26.8  06/02/2015 0247   MCHC 30.0 06/02/2015 0247   RDW 16.7* 06/02/2015 0247   LYMPHSABS 1.1 06/02/2015 0247   MONOABS 0.5 06/02/2015 0247   EOSABS 0.1 06/02/2015 0247   BASOSABS 0.0 06/02/2015 0247    CMP     Component Value Date/Time   NA 138 06/02/2015 0247   K 4.0 06/02/2015 0247   CL 105 06/02/2015 0247   CO2 23 06/02/2015 0247   GLUCOSE 115* 06/02/2015 0247   BUN 13 06/02/2015 0247   CREATININE 0.95 06/02/2015 0247   CALCIUM 8.3* 06/02/2015 0247   PROT 6.0* 06/02/2015 0247   ALBUMIN 2.7* 06/02/2015 0247   AST 23 06/02/2015 0247   ALT 8* 06/02/2015 0247   ALKPHOS 98 06/02/2015 0247   BILITOT 0.7 06/02/2015 0247   GFRNONAA >60 06/02/2015 0247   GFRAA >60 06/02/2015 0247       Problem List:  Patient Active Problem List   Diagnosis Date Noted  . Palliative care encounter   . Goals of care, counseling/discussion   . CAP (community acquired pneumonia) 06/02/2015  . FTT (failure to thrive) in adult 06/02/2015  . SIRS (systemic inflammatory response syndrome) ([place]) 06/02/2015  . Protein-calorie malnutrition, severe 06/02/2015  . Dementia with behavioral disturbance 12/24/2014  . Substance abuse 12/24/2014  . H/O personality disorder 12/24/2014  . Chronic diastolic CHF (congestive heart failure) ([place]) 07/02/2014  . Atrial fibrillation ([place]) 01/01/2014  . Tachy-brady syndrome ([place]) 01/01/2014  . Exertional dyspnea 01/01/2014  . Acute encephalopathy 12/12/2013  . Dehydration 12/12/2013  . Fall 12/12/2013  . Dysphagia, unspecified(787.20) 12/12/2013  . Compression fracture of L2 ([place]) 12/12/2013  . Near syncope  11/07/2013  . Dementia 11/07/2013  . Esophageal dysphagia 06/20/2013  . Esophageal ring 06/20/2013  . GERD (gastroesophageal reflux disease) 06/20/2013  . History of adenomatous polyp of colon 06/20/2013  . Alzheimer's dementia with behavioral disturbance 03/08/2013  . AA (alcohol abuse) 03/08/2013  . Absolute anemia 03/08/2013  . CAFL (chronic airflow  limitation) ([place]) 03/08/2013  . Abnormal LFTs 03/08/2013  . Family history of colonic polyps 03/08/2013  . A-fib ([place]) 03/08/2013  . H/O malignant neoplasm of prostate 03/08/2013  . BP (high blood pressure) 03/08/2013  . HLD (hyperlipidemia) 03/08/2013  . Hypomagnesemia 03/08/2013  . Osteopenia 03/08/2013  . Artificial cardiac pacemaker 03/08/2013  . Multiple rib fractures 03/24/2012  . Fall down steps 03/24/2012  . Altered mental status 03/24/2012  . COPD (chronic obstructive pulmonary disease) ([place]) 03/24/2012  . Hypertension 03/24/2012  . Spinal stenosis of lumbar region with radiculopathy 03/24/2012  . Fracture of lumbar spine ([place]) 10/18/2007  . Incarcerated incisional hernia 03/27/2007     Palliative Care Assessment & Plan    1.Code Status:  DNR    Code Status Orders        Start     Ordered   06/03/15 1923  Do not attempt resuscitation (DNR)   Continuous    Question Answer Comment  In the event of cardiac or respiratory ARREST Do not call a "code blue"   In the event of cardiac or respiratory ARREST Do not perform Intubation, CPR, defibrillation or ACLS   In the event of cardiac or respiratory ARREST Use medication by any route, position, wound care, and other measures to relive pain and suffering. May use oxygen, suction and manual treatment of airway obstruction as needed for comfort.      06/03/15 1922    Code Status History    Date Active Date Inactive Code Status Order ID Comments User Context   06/02/2015  1:59 AM 06/03/2015  7:22 PM Full Code [id number]  Eugenie Filler, MD Inpatient   12/12/2013 10:30 AM 12/16/2013  4:22 PM DNR [id number]  Eugenie Filler, MD Inpatient   11/08/2013 12:03 AM 11/09/2013  6:27 PM Full Code [id number]  Allyne Gee, MD Inpatient   03/24/2012  2:56 AM 03/24/2012  4:32 PM Full Code [id number]  Theressa Millard, MD Inpatient    Advance Directive Documentation        Most Recent Value   Type of Advance Directive  Living will    Pre-existing out of facility DNR order (yellow form or pink MOST form)     "MOST" Form in Place?         2. Goals of Care/Additional Recommendations:  Patient with current PNA and recurrent aspiration risk.  patient continues to aspirate. Was of care outlined again to focus exclusively on comfort. Consider enrollment in hospice, hospice consultation. Discussed with daughter extensively. She agrees. Also discussed with Dr. Eliseo Squires.       Limitations on Scope of Treatment: No Artificial Feeding  Psycho-social Needs: Caregiving  Support/Resources  3. Symptom Management:       1.Dysphagia: chronic aspiration risk.  Discussed development of diet for comfort with SLP.  Appreciate SLP assistance.  2. Confusion: Continue txt of underlying infection.  4. Palliative Prophylaxis:   Aspiration, Bowel Regimen, Delirium Protocol and Frequent Pain Assessment  5. Prognosis: days to weeks  6. Discharge Planning:  ?residential hospice    Care plan was discussed with patient's family, including her daughter, and  Dr Eliseo Squires  Thank you for allowing  the Palliative Medicine Team to assist in the care of this patient.   Time In: 0900 Time Out: 0925 Total Time 25 Prolonged Time Billed no        Loistine Chance, MD  06/06/2015, 1:31 PM  Please contact Palliative Medicine Team phone at [phone] for questions and concerns.

## 2015-06-06 NOTE — Progress Notes (Signed)
Speech Language Pathology Treatment: Dysphagia  Patient Details Name: Catalino Gurry MRN: [id number] DOB: 1933-11-01 Today's Date: 06/06/2015 Time: [id number] SLP Time Calculation (min) (ACUTE ONLY): 38 min  Assessment / Plan / Recommendation Clinical Impression  Daughter, [name], reports limited intake since SLP visit yesterday (few spoonfuls at a time) with frequent coughing including the need for suction this morning. Therapist provided repositioning for safer intake, and pt was agreeable to spoonfuls of nectar-thick broth, consuming approximately 7 bites before declining further trials. Intermittent oral holding and throat clearing observed, but vocal quality did remain clear. He had a delayed cough upon completion of trials, and was left in an upright position due to likely residue in pharynx.  Kitty expressed her concerns about poor intake, and did mention that her father asked for a fried egg. Reiterated that the diet order could always be liberalized to reflect full comfort measures should that be the route they choose to pursue, particularly if they choose to pursue inpatient hospice. For now, she wishes to continue with nectar thick liquids by spoon to implement some level of conservative measures.   Per Kitty's wishes, I also spoke on the phone with her cousin, Anette Riedel, who is an SLP in the [place] area. Results of MBS were reviewed, as well as a basic review of education that had been presented to the family already. All questions were answered, and Anette Riedel verbalized her understanding, particularly about the risks of aspiration as well as the risks of dehydration/malnutrition.  Family is very supportive. Will continue to follow and assist as able as they continue to work through these difficult decisions.   HPI HPI: With history of Alzheimer's dementia, COPD, hypertension, status post permanent pacemaker, depression/anxiety, gastroesophageal reflux disease, chronic diastolic heart  failure off diuretics will approximate 6 months per family, history of atrial fibrillation presented to the ED with worsening shortness of breath, worsening confusion, decreased oral intake, generalized weakness to the point whereby patient is no longer ambulating used to ambulate with the aid of a walker. Patient lives in an apartment with 24-hour care. Patient with a history of dementia and a such history was provided by patient's daughter and wife who are at bedside.      SLP Plan  Continue with current plan of care     Recommendations  Diet recommendations: Nectar-thick liquid Liquids provided via: Teaspoon Medication Administration: Via alternative means Supervision: Staff to assist with self feeding;Full supervision/cueing for compensatory strategies Compensations: Slow rate;Small sips/bites;Multiple dry swallows after each bite/sip Postural Changes and/or Swallow Maneuvers: Seated upright 90 degrees;Upright 30-60 min after meal             Oral Care Recommendations: Oral care QID Follow up Recommendations:  (tba) Plan: Continue with current plan of care     GO               Germain Osgood, M.A. CCC-SLP [phone]  Germain Osgood 06/06/2015, 10:58 AM

## 2015-06-06 NOTE — Progress Notes (Addendum)
Nutrition Follow-up  DOCUMENTATION CODES:   Severe malnutrition in context of acute illness/injury  INTERVENTION:  Continue comfort feeds with nectar thick.  NUTRITION DIAGNOSIS:   Malnutrition related to acute illness as evidenced by energy intake < or equal to 50% for > or equal to 5 days, moderate depletions of muscle mass, moderate depletion of body fat; ongoing  GOAL:   Patient will meet greater than or equal to 90% of their needs; not met  MONITOR:   Diet advancement, Weight trends, Labs, I & O's  REASON FOR ASSESSMENT:   Malnutrition Screening Tool    ASSESSMENT:   With history of Alzheimer's dementia, COPD, hypertension, status post permanent pacemaker, depression/anxiety, gastroesophageal reflux disease, chronic diastolic heart failure off diuretics will approximate 6 months per family, history of atrial fibrillation presented to the ED with worsening shortness of breath, worsening confusion, decreased oral intake, generalized weakness.  Pt on comfort feeds with nectar thick liquids. Pt very high risk aspiration. Pt consuming <25% of meals. Daughter at bedside reports pt mainly coughs with po intake. Pt and family decide against artifical feedings. Plans for hospice on discharge.  Diet Order:  Diet clear liquid Room service appropriate?: Yes; Fluid consistency:: Nectar Thick  Skin:  Reviewed, no issues  Last BM:  3/7  Height:   Ht Readings from Last 1 Encounters:  06/01/15 '5\' 9"'  (1.753 m)    Weight:   Wt Readings from Last 1 Encounters:  06/05/15 139 lb 1.8 oz (63.1 kg)    Ideal Body Weight:  72.7 kg  BMI:  Body mass index is 20.53 kg/(m^2).  Estimated Nutritional Needs:   Kcal:  1800-2000  Protein:  85-95 grams  Fluid:  1.8 - 2 L/day  EDUCATION NEEDS:   No education needs identified at this time  Corrin Parker, MS, RD, LDN Pager # [phone] After hours/ weekend pager # [phone]

## 2015-06-06 NOTE — Progress Notes (Signed)
[facility] PROGRESS NOTE  Jerry Kerr [id number] DOB: 1933/11/03 DOA: 06/01/2015 PCP: [name], MD  Brief narrative: Patient is a 82 year old with history of Alzheimer's dementia, COPD, hypertension, status post permanent pacemaker, depression/anxiety, GERD, chronic diastolic heart failure off diuretics, history of atrial fibrillation. Was admitted to the hospital with shortness of breath and worsening confusion. Patient was found on speech evaluation to have severe dysphagia and palliative care has been following. Currently plans are to transition to residential hospice if patient is not able to tolerate by mouth intake and/or sustain enough intake to support daily requirements.  Assessment/Plan: Principal Problem:   CAP (community acquired pneumonia)/metapneumovirus + - comfort focus -prob aspiration -finish course  Dysphagia - comfort feeds with nectar thick    COPD (chronic obstructive pulmonary disease) (HCC) - Continue current medication regimen, stable    Hypertension   GERD (gastroesophageal reflux disease) dementia   Dehydration   Fall   Atrial fibrillation (HCC)   HLD (hyperlipidemia)   Chronic diastolic CHF (congestive heart failure) ([place])    -comfort focus  Protein-calorie malnutrition, severe - Please see discussion above  Code Status: DNR Family Communication: daughter at bedside Disposition Plan: residential hospice placement   Consultants:  Palliative  Procedures:  None  Antibiotics:  Azithromycin  Rocephin   HPI/Subjective: Had choking episode with medications this AM requiring suctioning  Objective: Filed Vitals:   06/06/15 0603 06/06/15 1008  BP: 143/64 153/87  Pulse: 76 85  Temp: 98.2 F (36.8 C) 97.9 F (36.6 C)  Resp: 17 16    Intake/Output Summary (Last 24 hours) at 06/06/15 1308 Last data filed at 06/06/15 1009  Gross per 24 hour  Intake   2005 ml  Output   1725 ml  Net    280 ml   Filed Weights   06/02/15 2100 06/03/15 2130 06/05/15 2237  Weight: 63.8 kg (140 lb 10.5 oz) 63.2 kg (139 lb 5.3 oz) 63.1 kg (139 lb 1.8 oz)    Exam:   General:  Will awake, knows name and palce  Cardiovascular: s1 and s2 wnl, no rubs  Respiratory: no wheezes, equal chest rise, rales over right lung field  Abdomen: soft, nt, nd  Musculoskeletal: no cyanosis or clubbing   Data Reviewed: Basic Metabolic Panel:  Recent Labs Lab 06/01/15 1845 06/02/15 0247  NA 140 138  K 4.7 4.0  CL 99* 105  CO2 25 23  GLUCOSE 89 115*  BUN 12 13  CREATININE 1.09 0.95  CALCIUM 9.7 8.3*  MG  --  1.7   Liver Function Tests:  Recent Labs Lab 06/01/15 1845 06/02/15 0247  AST 27 23  ALT 9* 8*  ALKPHOS 119 98  BILITOT 0.7 0.7  PROT 7.1 6.0*  ALBUMIN 3.3* 2.7*   No results for input(s): LIPASE, AMYLASE in the last 168 hours. No results for input(s): AMMONIA in the last 168 hours. CBC:  Recent Labs Lab 06/01/15 2110 06/02/15 0247  WBC 6.4 4.9  NEUTROABS 4.2 3.2  HGB 11.8* 9.8*  HCT 39.0 32.7*  MCV 89.7 89.3  PLT 153 124*   Cardiac Enzymes: No results for input(s): CKTOTAL, CKMB, CKMBINDEX, TROPONINI in the last 168 hours. BNP (last 3 results)  Recent Labs  06/01/15 2241  BNP 231.1*    ProBNP (last 3 results)  Recent Labs  07/07/14 1025  PROBNP 274.0*    CBG:  Recent Labs Lab 06/01/15 1837 06/03/15 0719  GLUCAP 73 91    Recent Results (from the past 240 hour(s))  Culture, blood (routine x 2)     Status: None (Preliminary result)   Collection Time: 06/01/15  6:25 PM  Result Value Ref Range Status   Specimen Description BLOOD RIGHT ANTECUBITAL  Final   Special Requests BOTTLES DRAWN AEROBIC ONLY 10CC  Final   Culture NO GROWTH 4 DAYS  Final   Report Status PENDING  Incomplete  Culture, blood (routine x 2)     Status: None (Preliminary result)   Collection Time: 06/01/15  9:11 PM  Result Value Ref Range Status   Specimen Description BLOOD LEFT ANTECUBITAL  Final    Special Requests BOTTLES DRAWN AEROBIC AND ANAEROBIC 5CC  Final   Culture NO GROWTH 4 DAYS  Final   Report Status PENDING  Incomplete  Urine culture     Status: None   Collection Time: 06/01/15  9:54 PM  Result Value Ref Range Status   Specimen Description URINE, CATHETERIZED  Final   Special Requests NONE  Final   Culture NO GROWTH 2 DAYS  Final   Report Status 06/03/2015 FINAL  Final  Respiratory virus panel     Status: Abnormal   Collection Time: 06/02/15  2:12 AM  Result Value Ref Range Status   Respiratory Syncytial Virus A Negative Negative Final   Respiratory Syncytial Virus B Negative Negative Final   Influenza A Negative Negative Final   Influenza B Negative Negative Final   Parainfluenza 1 Negative Negative Final   Parainfluenza 2 Negative Negative Final   Parainfluenza 3 Negative Negative Final   Metapneumovirus Positive (A) Negative Final   Rhinovirus Negative Negative Final   Adenovirus Negative Negative Final    Comment: (NOTE) Performed At: [facility] [place], [place] [id number] Lindon Romp MD [id number]   MRSA PCR Screening     Status: Abnormal   Collection Time: 06/02/15  2:18 AM  Result Value Ref Range Status   MRSA by PCR POSITIVE (A) NEGATIVE Final    Comment:        The GeneXpert MRSA Assay (FDA approved for NASAL specimens only), is one component of a comprehensive MRSA colonization surveillance program. It is not intended to diagnose MRSA infection nor to guide or monitor treatment for MRSA infections. RESULT CALLED TO, READ BACK BY AND VERIFIED WITH: V BROWN,RN @0501  06/02/15 MKELLY   Culture, sputum-assessment     Status: None   Collection Time: 06/02/15  2:34 AM  Result Value Ref Range Status   Specimen Description EXPECTORATED SPUTUM  Final   Special Requests NONE  Final   Sputum evaluation   Final    MICROSCOPIC FINDINGS SUGGEST THAT THIS SPECIMEN IS NOT REPRESENTATIVE OF LOWER RESPIRATORY SECRETIONS.  PLEASE RECOLLECT. Gram Stain Report Called to,Read Back By and Verified With: V BROWN,RN @0453  06/02/15 MKELLY    Report Status 06/02/2015 FINAL  Final  Culture, blood (routine x 2) Call MD if unable to obtain prior to antibiotics being given     Status: None (Preliminary result)   Collection Time: 06/02/15  2:47 AM  Result Value Ref Range Status   Specimen Description BLOOD RIGHT ANTECUBITAL  Final   Special Requests BOTTLES DRAWN AEROBIC AND ANAEROBIC 10CC  Final   Culture NO GROWTH 3 DAYS  Final   Report Status PENDING  Incomplete  Culture, blood (routine x 2) Call MD if unable to obtain prior to antibiotics being given     Status: None (Preliminary result)   Collection Time: 06/02/15  2:53 AM  Result Value Ref Range  Status   Specimen Description BLOOD RIGHT HAND  Final   Special Requests BOTTLES DRAWN AEROBIC ONLY 6CC  Final   Culture NO GROWTH 3 DAYS  Final   Report Status PENDING  Incomplete     Studies: No results found.  Scheduled Meds: . antiseptic oral rinse  7 mL Mouth Rinse q12n4p  . arformoterol  15 mcg Nebulization BID  . aspirin EC  81 mg Oral Daily  . azithromycin  500 mg Intravenous Q24H  . budesonide (PULMICORT) nebulizer solution  0.25 mg Nebulization BID  . cefTRIAXone (ROCEPHIN)  IV  1 g Intravenous Q24H  . chlorhexidine  15 mL Mouth Rinse BID  . Chlorhexidine Gluconate Cloth  6 each Topical Q0600  . divalproex  500 mg Oral BID  . enoxaparin (LOVENOX) injection  40 mg Subcutaneous Daily  . feeding supplement (ENSURE ENLIVE)  237 mL Oral BID BM  . fluticasone  2 spray Each Nare Daily  . gatifloxacin  1 drop Right Eye TID  . guaiFENesin  1,200 mg Oral BID  . loratadine  10 mg Oral Daily  . memantine  10 mg Oral BID  . mupirocin ointment  1 application Nasal BID  . pantoprazole  40 mg Oral Daily  . QUEtiapine  100 mg Oral QHS  . sertraline  100 mg Oral Daily   Continuous Infusions: . dextrose 5 % and 0.45 % NaCl with KCl 20 mEq/L 100 mL/hr at 06/05/15  1419    Time spent: 25 minutes    [facility] Pager [phone]. If 7PM-7AM, please contact night-coverage at [email or web address], password [facility] 06/06/2015, 1:08 PM  LOS: 4 days

## 2015-06-07 DIAGNOSIS — G308 Other Alzheimer's disease: Secondary | ICD-10-CM

## 2015-06-07 LAB — CULTURE, BLOOD (ROUTINE X 2)
CULTURE: NO GROWTH
CULTURE: NO GROWTH

## 2015-06-07 NOTE — Care Management Note (Signed)
Case Management Note  Patient Details  Name: Jerry Kerr MRN: [id number] Date of Birth: March 10, 1934  Subjective/Objective:        CM following for progressions and d/c planning.            Action/Plan: 06/07/2015 Met with pt daughter re d/c plans , awaiting residential Hospice working with [place].   Expected Discharge Date:                 Expected Discharge Plan:  [place]  In-House Referral:  Clinical Social Work  Discharge planning Services  NA  Post Acute Care Choice:  NA Choice offered to:  NA  DME Arranged:    DME Agency:     HH Arranged:    [place] Agency:  NA  Status of Service:  In process, will continue to follow  Medicare Important Message Given:  Yes Date Medicare IM Given:    Medicare IM give by:    Date Additional Medicare IM Given:    Additional Medicare Important Message give by:     If discussed at [place] of Stay Meetings, dates discussed:    Additional Comments:  Adron Bene, RN 06/07/2015, 3:17 PM

## 2015-06-07 NOTE — Progress Notes (Signed)
Daily Progress Note   Patient Name: Jerry Kerr       Date: 06/07/2015 DOB: Jul 21, 1933  Age: 82 y.o. MRN#: [id number] Attending Physician: Geradine Girt, DO Primary Care Physician: [name], MD Admit Date: 06/01/2015  Reason for Consultation/Follow-up: Establishing goals of care  Subjective: I met with Mr. Zuercher daughter, [name],  this morning. Patient not as a alert not as awake as compared to the past 24 hours. Continues to have no desire for food or drink. Continues to have aspiration episodes. Goals are to continue to focus on comfort. Awaiting hospice arrangements-discussed with Erling Conte from hospice, appreciate hospice input. Likely transfer to residential hospice soon.  Brief life review performed. Patient one of 3 sons. Patient's 2 brothers are deceased. Patient's brother died at [facility] from cancer a year ago. Discussed about comfort measures. Discussed about scope of care that can be provided at hospice facility. Likely prognosis days at this point. Patient with essentially minimal-nil oral intake since the last 24-36 hours.      Length of Stay: 5 days  Current Medications: Scheduled Meds:  . antiseptic oral rinse  7 mL Mouth Rinse q12n4p  . arformoterol  15 mcg Nebulization BID  . azithromycin  500 mg Intravenous Q24H  . budesonide (PULMICORT) nebulizer solution  0.25 mg Nebulization BID  . cefTRIAXone (ROCEPHIN)  IV  1 g Intravenous Q24H  . Chlorhexidine Gluconate Cloth  6 each Topical Q0600  . fluticasone  2 spray Each Nare Daily  . gatifloxacin  1 drop Right Eye TID  . mupirocin ointment  1 application Nasal BID  . valproate sodium  500 mg Intravenous Q12H    Continuous Infusions: . dextrose 5 % and 0.45 % NaCl with KCl 20 mEq/L 50 mL/hr at  06/06/15 1612    PRN Meds: glycopyrrolate, ipratropium, levalbuterol, RESOURCE THICKENUP CLEAR  Physical Exam: Physical Exam             General: not as alert not as awake, appears with noisy breathing this am HEENT: No bruits, no goiter, no JVD Heart: Regular rate and rhythm. No murmur appreciated. Lungs: shallow breathing.  Abdomen: Soft, nontender, nondistended, positive bowel sounds.  Ext: No significant edema Skin: Warm and dry  Vital Signs: BP 113/52 mmHg  Pulse 75  Temp(Src) 97.3 F (36.3 C) (  Oral)  Resp 18  Ht 5' 9" (1.753 m)  Wt 63.5 kg (139 lb 15.9 oz)  BMI 20.66 kg/m2  SpO2 100% SpO2: SpO2: 100 % O2 Device: O2 Device: Nasal Cannula O2 Flow Rate: O2 Flow Rate (L/min): 4 L/min  Intake/output summary:   Intake/Output Summary (Last 24 hours) at 06/07/15 1132 Last data filed at 06/07/15 0700  Gross per 24 hour  Intake   1405 ml  Output    450 ml  Net    955 ml   LBM: Last BM Date: 06/05/15 Baseline Weight: Weight: 63.504 kg (140 lb) Most recent weight: Weight: 63.5 kg (139 lb 15.9 oz)       Palliative Assessment/Data: Flowsheet Rows        Most Recent Value   Intake Tab    Referral Department  Hospitalist   Unit at Time of Referral  Med/Surg Unit   Palliative Care Primary Diagnosis  Neurology   Date Notified  06/02/15   Palliative Care Type  New Palliative care   Reason for referral  Clarify Goals of Care   Date of Admission  06/02/15   Date first seen by Palliative Care  06/03/15   # of days Palliative referral response time  1 Day(s)   # of days IP prior to Palliative referral  0   Clinical Assessment    Palliative Performance Scale Score  50%   Pain Max last 24 hours  3   Pain Min Last 24 hours  0   Psychosocial & Spiritual Assessment    Palliative Care Outcomes    Patient/Family meeting held?  Yes   Who was at the meeting?  Patients daughter/poa   Palliative Care Outcomes  Clarified goals of care, Changed CPR status      Additional Data  Reviewed: CBC    Component Value Date/Time   WBC 4.9 06/02/2015 0247   RBC 3.66* 06/02/2015 0247   HGB 9.8* 06/02/2015 0247   HCT 32.7* 06/02/2015 0247   PLT 124* 06/02/2015 0247   MCV 89.3 06/02/2015 0247   MCH 26.8 06/02/2015 0247   MCHC 30.0 06/02/2015 0247   RDW 16.7* 06/02/2015 0247   LYMPHSABS 1.1 06/02/2015 0247   MONOABS 0.5 06/02/2015 0247   EOSABS 0.1 06/02/2015 0247   BASOSABS 0.0 06/02/2015 0247    CMP     Component Value Date/Time   NA 138 06/02/2015 0247   K 4.0 06/02/2015 0247   CL 105 06/02/2015 0247   CO2 23 06/02/2015 0247   GLUCOSE 115* 06/02/2015 0247   BUN 13 06/02/2015 0247   CREATININE 0.95 06/02/2015 0247   CALCIUM 8.3* 06/02/2015 0247   PROT 6.0* 06/02/2015 0247   ALBUMIN 2.7* 06/02/2015 0247   AST 23 06/02/2015 0247   ALT 8* 06/02/2015 0247   ALKPHOS 98 06/02/2015 0247   BILITOT 0.7 06/02/2015 0247   GFRNONAA >60 06/02/2015 0247   GFRAA >60 06/02/2015 0247       Problem List:  Patient Active Problem List   Diagnosis Date Noted  . Encounter for palliative care   . Palliative care encounter   . Goals of care, counseling/discussion   . CAP (community acquired pneumonia) 06/02/2015  . FTT (failure to thrive) in adult 06/02/2015  . SIRS (systemic inflammatory response syndrome) ([place]) 06/02/2015  . Protein-calorie malnutrition, severe 06/02/2015  . Dementia with behavioral disturbance 12/24/2014  . Substance abuse 12/24/2014  . H/O personality disorder 12/24/2014  . Chronic diastolic CHF (congestive heart failure) ([place]) 07/02/2014  .  Atrial fibrillation ([place]) 01/01/2014  . Tachy-brady syndrome ([place]) 01/01/2014  . Exertional dyspnea 01/01/2014  . Acute encephalopathy 12/12/2013  . Dehydration 12/12/2013  . Fall 12/12/2013  . Dysphagia, unspecified(787.20) 12/12/2013  . Compression fracture of L2 ([place]) 12/12/2013  . Near syncope 11/07/2013  . Dementia 11/07/2013  . Esophageal dysphagia 06/20/2013  . Esophageal ring 06/20/2013  .  GERD (gastroesophageal reflux disease) 06/20/2013  . History of adenomatous polyp of colon 06/20/2013  . Alzheimer's dementia with behavioral disturbance 03/08/2013  . AA (alcohol abuse) 03/08/2013  . Absolute anemia 03/08/2013  . CAFL (chronic airflow limitation) ([place]) 03/08/2013  . Abnormal LFTs 03/08/2013  . Family history of colonic polyps 03/08/2013  . A-fib ([place]) 03/08/2013  . H/O malignant neoplasm of prostate 03/08/2013  . BP (high blood pressure) 03/08/2013  . HLD (hyperlipidemia) 03/08/2013  . Hypomagnesemia 03/08/2013  . Osteopenia 03/08/2013  . Artificial cardiac pacemaker 03/08/2013  . Multiple rib fractures 03/24/2012  . Fall down steps 03/24/2012  . Altered mental status 03/24/2012  . COPD (chronic obstructive pulmonary disease) ([place]) 03/24/2012  . Hypertension 03/24/2012  . Spinal stenosis of lumbar region with radiculopathy 03/24/2012  . Fracture of lumbar spine ([place]) 10/18/2007  . Incarcerated incisional hernia 03/27/2007     Palliative Care Assessment & Plan    1.Code Status:  DNR    Code Status Orders        Start     Ordered   06/03/15 1923  Do not attempt resuscitation (DNR)   Continuous    Question Answer Comment  In the event of cardiac or respiratory ARREST Do not call a "code blue"   In the event of cardiac or respiratory ARREST Do not perform Intubation, CPR, defibrillation or ACLS   In the event of cardiac or respiratory ARREST Use medication by any route, position, wound care, and other measures to relive pain and suffering. May use oxygen, suction and manual treatment of airway obstruction as needed for comfort.      06/03/15 1922    Code Status History    Date Active Date Inactive Code Status Order ID Comments User Context   06/02/2015  1:59 AM 06/03/2015  7:22 PM Full Code [id number]  Eugenie Filler, MD Inpatient   12/12/2013 10:30 AM 12/16/2013  4:22 PM DNR [id number]  Eugenie Filler, MD Inpatient   11/08/2013 12:03 AM 11/09/2013  6:27  PM Full Code [id number]  Allyne Gee, MD Inpatient   03/24/2012  2:56 AM 03/24/2012  4:32 PM Full Code [id number]  Theressa Millard, MD Inpatient    Advance Directive Documentation        Most Recent Value   Type of Advance Directive  Living will   Pre-existing out of facility DNR order (yellow form or pink MOST form)     "MOST" Form in Place?         2. Goals of Care/Additional Recommendations:  Patient with current PNA and recurrent aspiration risk.  patient continues to aspirate. Goals of care outlined again to focus exclusively on comfort. Consider enrollment in hospice, hospice consultation. Discussed with daughter extensively. She agrees.    Limitations on Scope of Treatment: No Artificial Feeding  Psycho-social Needs: Caregiving  Support/Resources  3. Symptom Management:       1.Dysphagia: chronic aspiration risk.  Discussed development of diet for comfort with SLP.  Appreciate SLP assistance.  2. Confusion: Continue txt of underlying infection.  4. Palliative Prophylaxis:   Aspiration, Bowel Regimen, Delirium Protocol and Frequent  Pain Assessment  5. Prognosis: days   6. Discharge Planning:  ?residential hospice    Care plan was discussed with patient's daughter, and  Erling Conte from Hospice.   Thank you for allowing the Palliative Medicine Team to assist in the care of this patient.   Time In: 0900 Time Out: 0925 Total Time 25 Prolonged Time Billed no        Loistine Chance, MD  06/07/2015, 11:32 AM  Please contact Palliative Medicine Team phone at [phone] for questions and concerns.

## 2015-06-07 NOTE — Progress Notes (Signed)
Speech Language Pathology Treatment: Dysphagia  Patient Details Name: Jerry Kerr MRN: [id number] DOB: 1934-02-25 Today's Date: 06/07/2015 Time: 8590-9311 SLP Time Calculation (min) (ACUTE ONLY): 19 min  Assessment / Plan / Recommendation Clinical Impression  Met with pt and wife this morning. Per her request SLP offered trials of nectar thick milk, of which pt consumed 6 spoonfuls. He is clearly working hard to swallow even small amounts, with multiple effortful swallows and immediate throat clearing noted. His voice remains clear, but a delayed cough is noted upon completion of trials. SLP provided oral suction to remove small amount of milk that pt had been able to cough up to the back of his oropharynx. Pt politely declined further trials. Left pt sitting upright to allow for safer positioning due to residue that likely remained in his pharynx.   Per chart review, it appears as though pt and family are pursuing residential hospice placement due to persistent difficulties with aspiration and minimal PO intake. Will f/u as our services are still indicated.   HPI HPI: With history of Alzheimer's dementia, COPD, hypertension, status post permanent pacemaker, depression/anxiety, gastroesophageal reflux disease, chronic diastolic heart failure off diuretics will approximate 6 months per family, history of atrial fibrillation presented to the ED with worsening shortness of breath, worsening confusion, decreased oral intake, generalized weakness to the point whereby patient is no longer ambulating used to ambulate with the aid of a walker. Patient lives in an apartment with 24-hour care. Patient with a history of dementia and a such history was provided by patient's daughter and wife who are at bedside.      SLP Plan  Continue with current plan of care     Recommendations  Diet recommendations: Nectar-thick liquid Liquids provided via: Teaspoon Medication Administration: Via alternative  means Supervision: Staff to assist with self feeding;Full supervision/cueing for compensatory strategies Compensations: Slow rate;Small sips/bites;Multiple dry swallows after each bite/sip Postural Changes and/or Swallow Maneuvers: Seated upright 90 degrees;Upright 30-60 min after meal             Oral Care Recommendations: Oral care QID Follow up Recommendations:  (tba - pursuing residential hospice) Plan: Continue with current plan of care     GO               Germain Osgood, M.A. CCC-SLP [phone]  Germain Osgood 06/07/2015, 12:16 PM

## 2015-06-07 NOTE — Consult Note (Signed)
[place] with daughter/HCPOA Kitty by phone to acknowledge referral received from CSW this morning and answer her questions. She is aware her father's clinical information is being reviewed. Will follow up with her again later today.   Thank you.  Erling Conte, [place]

## 2015-06-07 NOTE — Progress Notes (Signed)
Foley catheter placed per MD order for end of life.  Clear yellow urine return. Patient tolerated well.

## 2015-06-07 NOTE — Progress Notes (Signed)
[facility] PROGRESS NOTE  Jerry Kerr [id number] DOB: May 23, 1933 DOA: 06/01/2015 PCP: [name], MD  Brief narrative: Patient is a 82 year old with history of Alzheimer's dementia, COPD, hypertension, status post permanent pacemaker, depression/anxiety, GERD, chronic diastolic heart failure off diuretics, history of atrial fibrillation. Was admitted to the hospital with shortness of breath and worsening confusion. Patient was found on speech evaluation to have severe dysphagia and palliative care has been following. Currently plans are to transition to residential hospice as patient is not able to sustain enough intake to support daily requirements.  Assessment/Plan:   CAP (community acquired pneumonia)/metapneumovirus + - comfort focus -prob aspiration -finish course of abx   Dysphagia - comfort feeds with nectar thick    COPD (chronic obstructive pulmonary disease) ([place]) - Continue current medication regimen, stable    Hypertension   GERD (gastroesophageal reflux disease) dementia   Dehydration   Fall   Atrial fibrillation ([facility])   HLD (hyperlipidemia)   Chronic diastolic CHF (congestive heart failure) ([place])    -comfort focus  Protein-calorie malnutrition, severe - Please see discussion above  Code Status: DNR Family Communication: daughter at bedside Disposition Plan: residential hospice placement   Consultants:  Palliative  Procedures:  None  Antibiotics:  Azithromycin  Rocephin   HPI/Subjective: Not eating much at all  Objective: Filed Vitals:   06/07/15 0441 06/07/15 0817  BP: 120/50 113/52  Pulse: 65 75  Temp: 97.5 F (36.4 C) 97.3 F (36.3 C)  Resp: 18 18    Intake/Output Summary (Last 24 hours) at 06/07/15 1425 Last data filed at 06/07/15 1325  Gross per 24 hour  Intake   1525 ml  Output    450 ml  Net   1075 ml   Filed Weights   06/03/15 2130 06/05/15 2237 06/06/15 2028  Weight: 63.2 kg (139 lb 5.3 oz) 63.1 kg (139 lb  1.8 oz) 63.5 kg (139 lb 15.9 oz)    Exam:   General:  Will awaken, no increased work of breathing  Cardiovascular: s1 and s2 wnl, no rubs  Respiratory: no wheezes, equal chest rise, rales over right lung field  Abdomen: soft, nt, nd  Musculoskeletal: no cyanosis or clubbing   Data Reviewed: Basic Metabolic Panel:  Recent Labs Lab 06/01/15 1845 06/02/15 0247  NA 140 138  K 4.7 4.0  CL 99* 105  CO2 25 23  GLUCOSE 89 115*  BUN 12 13  CREATININE 1.09 0.95  CALCIUM 9.7 8.3*  MG  --  1.7   Liver Function Tests:  Recent Labs Lab 06/01/15 1845 06/02/15 0247  AST 27 23  ALT 9* 8*  ALKPHOS 119 98  BILITOT 0.7 0.7  PROT 7.1 6.0*  ALBUMIN 3.3* 2.7*   No results for input(s): LIPASE, AMYLASE in the last 168 hours. No results for input(s): AMMONIA in the last 168 hours. CBC:  Recent Labs Lab 06/01/15 2110 06/02/15 0247  WBC 6.4 4.9  NEUTROABS 4.2 3.2  HGB 11.8* 9.8*  HCT 39.0 32.7*  MCV 89.7 89.3  PLT 153 124*   Cardiac Enzymes: No results for input(s): CKTOTAL, CKMB, CKMBINDEX, TROPONINI in the last 168 hours. BNP (last 3 results)  Recent Labs  06/01/15 2241  BNP 231.1*    ProBNP (last 3 results)  Recent Labs  07/07/14 1025  PROBNP 274.0*    CBG:  Recent Labs Lab 06/01/15 1837 06/03/15 0719  GLUCAP 73 91    Recent Results (from the past 240 hour(s))  Culture, blood (routine x 2)  Status: None   Collection Time: 06/01/15  6:25 PM  Result Value Ref Range Status   Specimen Description BLOOD RIGHT ANTECUBITAL  Final   Special Requests BOTTLES DRAWN AEROBIC ONLY 10CC  Final   Culture NO GROWTH 5 DAYS  Final   Report Status 06/06/2015 FINAL  Final  Culture, blood (routine x 2)     Status: None   Collection Time: 06/01/15  9:11 PM  Result Value Ref Range Status   Specimen Description BLOOD LEFT ANTECUBITAL  Final   Special Requests BOTTLES DRAWN AEROBIC AND ANAEROBIC 5CC  Final   Culture NO GROWTH 5 DAYS  Final   Report Status  06/06/2015 FINAL  Final  Urine culture     Status: None   Collection Time: 06/01/15  9:54 PM  Result Value Ref Range Status   Specimen Description URINE, CATHETERIZED  Final   Special Requests NONE  Final   Culture NO GROWTH 2 DAYS  Final   Report Status 06/03/2015 FINAL  Final  Respiratory virus panel     Status: Abnormal   Collection Time: 06/02/15  2:12 AM  Result Value Ref Range Status   Respiratory Syncytial Virus A Negative Negative Final   Respiratory Syncytial Virus B Negative Negative Final   Influenza A Negative Negative Final   Influenza B Negative Negative Final   Parainfluenza 1 Negative Negative Final   Parainfluenza 2 Negative Negative Final   Parainfluenza 3 Negative Negative Final   Metapneumovirus Positive (A) Negative Final   Rhinovirus Negative Negative Final   Adenovirus Negative Negative Final    Comment: (NOTE) Performed At: [facility] [place], [place] [id number] Lindon Romp MD [id number]   MRSA PCR Screening     Status: Abnormal   Collection Time: 06/02/15  2:18 AM  Result Value Ref Range Status   MRSA by PCR POSITIVE (A) NEGATIVE Final    Comment:        The GeneXpert MRSA Assay (FDA approved for NASAL specimens only), is one component of a comprehensive MRSA colonization surveillance program. It is not intended to diagnose MRSA infection nor to guide or monitor treatment for MRSA infections. RESULT CALLED TO, READ BACK BY AND VERIFIED WITH: V BROWN,RN @0501  06/02/15 MKELLY   Culture, sputum-assessment     Status: None   Collection Time: 06/02/15  2:34 AM  Result Value Ref Range Status   Specimen Description EXPECTORATED SPUTUM  Final   Special Requests NONE  Final   Sputum evaluation   Final    MICROSCOPIC FINDINGS SUGGEST THAT THIS SPECIMEN IS NOT REPRESENTATIVE OF LOWER RESPIRATORY SECRETIONS. PLEASE RECOLLECT. Gram Stain Report Called to,Read Back By and Verified With: V BROWN,RN @0453  06/02/15 MKELLY     Report Status 06/02/2015 FINAL  Final  Culture, blood (routine x 2) Call MD if unable to obtain prior to antibiotics being given     Status: None   Collection Time: 06/02/15  2:47 AM  Result Value Ref Range Status   Specimen Description BLOOD RIGHT ANTECUBITAL  Final   Special Requests BOTTLES DRAWN AEROBIC AND ANAEROBIC 10CC  Final   Culture NO GROWTH 5 DAYS  Final   Report Status 06/07/2015 FINAL  Final  Culture, blood (routine x 2) Call MD if unable to obtain prior to antibiotics being given     Status: None   Collection Time: 06/02/15  2:53 AM  Result Value Ref Range Status   Specimen Description BLOOD RIGHT HAND  Final   Special Requests  BOTTLES DRAWN AEROBIC ONLY 6CC  Final   Culture NO GROWTH 5 DAYS  Final   Report Status 06/07/2015 FINAL  Final     Studies: No results found.  Scheduled Meds: . antiseptic oral rinse  7 mL Mouth Rinse q12n4p  . arformoterol  15 mcg Nebulization BID  . azithromycin  500 mg Intravenous Q24H  . budesonide (PULMICORT) nebulizer solution  0.25 mg Nebulization BID  . cefTRIAXone (ROCEPHIN)  IV  1 g Intravenous Q24H  . Chlorhexidine Gluconate Cloth  6 each Topical Q0600  . fluticasone  2 spray Each Nare Daily  . gatifloxacin  1 drop Right Eye TID  . mupirocin ointment  1 application Nasal BID  . valproate sodium  500 mg Intravenous Q12H   Continuous Infusions: . dextrose 5 % and 0.45 % NaCl with KCl 20 mEq/L 50 mL/hr at 06/06/15 1612    Time spent: 25 minutes    [facility] Pager [phone]. If 7PM-7AM, please contact night-coverage at [email or web address], password [email or web address] 06/07/2015, 2:25 PM  LOS: 5 days

## 2015-06-07 NOTE — NC FL2 (Signed)
[place] LEVEL OF CARE SCREENING TOOL     IDENTIFICATION  Patient Name: Jerry Kerr Birthdate: [birth year] Sex: male Admission Date (Current Location): 06/01/2015  [facility] and [place] Number:  Herbalist and Address:  [facility], [place] [address], [place], [place] [address]      Provider Number: [id number]  Attending Physician Name and Address:  Geradine Girt, DO  Relative Name and Phone Number:  Priscille Kluver - daughter.  [phone]    Current Level of Care: SNF Recommended Level of Care: [place] Prior Approval Number:    Date Approved/Denied:   PASRR Number:  (Submitted for PASRR 06/07/15)  Discharge Plan: SNF    Current Diagnoses: Patient Active Problem List   Diagnosis Date Noted  . Encounter for palliative care   . Palliative care encounter   . Goals of care, counseling/discussion   . CAP (community acquired pneumonia) 06/02/2015  . FTT (failure to thrive) in adult 06/02/2015  . SIRS (systemic inflammatory response syndrome) ([place]) 06/02/2015  . Protein-calorie malnutrition, severe 06/02/2015  . Dementia with behavioral disturbance 12/24/2014  . Substance abuse 12/24/2014  . H/O personality disorder 12/24/2014  . Chronic diastolic CHF (congestive heart failure) ([place]) 07/02/2014  . Atrial fibrillation ([place]) 01/01/2014  . Tachy-brady syndrome ([place]) 01/01/2014  . Exertional dyspnea 01/01/2014  . Acute encephalopathy 12/12/2013  . Dehydration 12/12/2013  . Fall 12/12/2013  . Dysphagia, unspecified(787.20) 12/12/2013  . Compression fracture of L2 ([place]) 12/12/2013  . Near syncope 11/07/2013  . Dementia 11/07/2013  . Esophageal dysphagia 06/20/2013  . Esophageal ring 06/20/2013  . GERD (gastroesophageal reflux disease) 06/20/2013  . History of adenomatous polyp of colon 06/20/2013  . Alzheimer's dementia with behavioral disturbance 03/08/2013  . AA (alcohol abuse) 03/08/2013  . Absolute anemia  03/08/2013  . CAFL (chronic airflow limitation) ([place]) 03/08/2013  . Abnormal LFTs 03/08/2013  . Family history of colonic polyps 03/08/2013  . A-fib ([place]) 03/08/2013  . H/O malignant neoplasm of prostate 03/08/2013  . BP (high blood pressure) 03/08/2013  . HLD (hyperlipidemia) 03/08/2013  . Hypomagnesemia 03/08/2013  . Osteopenia 03/08/2013  . Artificial cardiac pacemaker 03/08/2013  . Multiple rib fractures 03/24/2012  . Fall down steps 03/24/2012  . Altered mental status 03/24/2012  . COPD (chronic obstructive pulmonary disease) ([place]) 03/24/2012  . Hypertension 03/24/2012  . Spinal stenosis of lumbar region with radiculopathy 03/24/2012  . Fracture of lumbar spine ([place]) 10/18/2007  . Incarcerated incisional hernia 03/27/2007    Orientation RESPIRATION BLADDER Height & Weight     Self, Place  Normal Incontinent, Indwelling catheter (Urethral catheter placed 3/9) Weight: 139 lb 15.9 oz (63.5 kg) Height:  5\' 9"  (175.3 cm)  BEHAVIORAL SYMPTOMS/MOOD NEUROLOGICAL BOWEL NUTRITION STATUS      Incontinent Diet (Clear liquid)  AMBULATORY STATUS COMMUNICATION OF NEEDS Skin   Limited Assist (On 3/4 patient ambulated 25 feet with a 2-wheeled rolling walker) Verbally Other (Comment) (Moisture associated skin damage to perineum, treated with barrier cream. Ecchymosis to left buttock.)                       Personal Care Assistance Level of Assistance  Bathing, Feeding, Dressing Bathing Assistance: Maximum assistance Feeding assistance: Independent Dressing Assistance: Maximum assistance     Functional Limitations Info  Sight, Hearing, Speech Sight Info: Adequate Hearing Info: Adequate Speech Info: Adequate    SPECIAL CARE FACTORS FREQUENCY  Speech therapy, PT (By licensed PT), OT (By licensed  OT)     PT Frequency: Evaluated 3/4 - a minimum of 2X per week recommended OT Frequency: Evaluated 3/5 - a minimum of 2X per week recommended     Speech Therapy Frequency: Evaluation  06/02/15. Nectar thick liquid recommended      Contractures Contractures Info: Not present    Additional Factors Info  Code Status, Allergies Code Status Info: DNR Allergies Info: Hydrocodone           Current Medications (06/07/2015):  This is the current hospital active medication list Current Facility-Administered Medications  Medication Dose Route Frequency Provider Last Rate Last Dose  . antiseptic oral rinse (CPC / CETYLPYRIDINIUM CHLORIDE 0.05%) solution 7 mL  7 mL Mouth Rinse q12n4p Eugenie Filler, MD   7 mL at 06/07/15 1541  . arformoterol (BROVANA) nebulizer solution 15 mcg  15 mcg Nebulization BID Eugenie Filler, MD   15 mcg at 06/06/15 1924  . azithromycin (ZITHROMAX) 500 mg in dextrose 5 % 250 mL IVPB  500 mg Intravenous Q24H Eugenie Filler, MD   500 mg at 06/07/15 0100  . budesonide (PULMICORT) nebulizer solution 0.25 mg  0.25 mg Nebulization BID Eugenie Filler, MD   0.25 mg at 06/06/15 1924  . cefTRIAXone (ROCEPHIN) 1 g in dextrose 5 % 50 mL IVPB  1 g Intravenous Q24H Eugenie Filler, MD   1 g at 06/07/15 0109  . Chlorhexidine Gluconate Cloth 2 % PADS 6 each  6 each Topical Q0600 Velvet Bathe, MD   6 each at 06/07/15 0601  . dextrose 5 % and 0.45 % NaCl with KCl 20 mEq/L infusion   Intravenous Continuous Geradine Girt, DO 50 mL/hr at 06/06/15 1612    . fluticasone (FLONASE) 50 MCG/ACT nasal spray 2 spray  2 spray Each Nare Daily Eugenie Filler, MD   2 spray at 06/07/15 1305  . gatifloxacin (ZYMAXID) 0.5 % ophthalmic drops 1 drop  1 drop Right Eye TID Eugenie Filler, MD   1 drop at 06/07/15 1541  . glycopyrrolate (ROBINUL) injection 0.4 mg  0.4 mg Intravenous Q4H PRN Loistine Chance, MD      . ipratropium (ATROVENT) nebulizer solution 0.5 mg  0.5 mg Nebulization Q2H PRN Eugenie Filler, MD   0.5 mg at 06/03/15 1554  . levalbuterol (XOPENEX) nebulizer solution 0.63 mg  0.63 mg Nebulization Q2H PRN Eugenie Filler, MD   0.63 mg at 06/07/15 1517  . mupirocin  ointment (BACTROBAN) 2 % 1 application  1 application Nasal BID Velvet Bathe, MD   1 application at [id number]  . RESOURCE THICKENUP CLEAR   Oral PRN Micheline Rough, MD      . valproate (DEPACON) 500 mg in dextrose 5 % 50 mL IVPB  500 mg Intravenous Q12H Gardiner Barefoot, NP   500 mg at 06/07/15 1037     Discharge Medications: Please see discharge summary for a list of discharge medications.  Relevant Imaging Results:  Relevant Lab Results:   Additional Information [phone]  Sable Feil, LCSW

## 2015-06-07 NOTE — Clinical Social Work Note (Signed)
CSW received residential hospice placement consult on 3/8, however [facility] (families choice) had no beds on 3/8 or today.  CSW talked with daughter, Cheral Marker regarding other d/c options, including SNF and daughter agreeable. Full assessment to follow. CSW initiatied facility search today.  Jerry Kerr, MSW, LCSW Licensed Clinical Social Worker [place] [phone]

## 2015-06-07 NOTE — Care Management Important Message (Signed)
Important Message  Patient Details  Name: Furious Writer MRN: [id number] Date of Birth: 1933-05-19   Medicare Important Message Given:  Yes    Moneisha Vosler, Rory Percy, RN 06/07/2015, 3:07 PM

## 2015-06-08 DIAGNOSIS — R1310 Dysphagia, unspecified: Secondary | ICD-10-CM

## 2015-06-08 MED ORDER — GLYCOPYRROLATE 0.2 MG/ML IJ SOLN: 0.4000 mg | INTRAMUSCULAR | Status: AC | PRN

## 2015-06-08 NOTE — Progress Notes (Signed)
Daily Progress Note   Patient Name: Jerry Kerr       Date: 06/08/2015 DOB: 07/19/1933  Age: 82 y.o. MRN#: [id number] Attending Physician: Geradine Girt, DO Primary Care Physician: [name], MD Admit Date: 06/01/2015  Reason for Consultation/Follow-up: Establishing goals of care  Subjective: I met with [name] daughter, [name],  this morning. Patient not as a alert not as awake as compared to the past 24 hours. Continues to have no desire for food or drink. Continues to have aspiration episodes. Goals are to continue to focus on comfort. Awaiting hospice arrangements-discussed with Erling Conte from hospice, appreciate hospice input.   Patient continues with essentially minimal-nil oral intake since the last 24-36 hours.  Discussed with Dr Eliseo Squires and with CSW Alben Spittle: patient's daughter now agreeable [facility]. Could also consider [facility] with [facility] if [facility] unable to [facility].   Prognosis appears to be days at this point. Patient with less responsiveness, not eating. Episodes of aspiration, even on his salivary secretions, necessitating use of suction, patient also on Robinul PRN.     Length of Stay: 6 days  Current Medications: Scheduled Meds:  . antiseptic oral rinse  7 mL Mouth Rinse q12n4p  . arformoterol  15 mcg Nebulization BID  . azithromycin  500 mg Intravenous Q24H  . budesonide (PULMICORT) nebulizer solution  0.25 mg Nebulization BID  . cefTRIAXone (ROCEPHIN)  IV  1 g Intravenous Q24H  . fluticasone  2 spray Each Nare Daily  . gatifloxacin  1 drop Right Eye TID  . valproate sodium  500 mg Intravenous Q12H    Continuous Infusions: . dextrose 5 % and 0.45 % NaCl with KCl 20 mEq/L 50 mL/hr at 06/07/15 1843    PRN  Meds: glycopyrrolate, ipratropium, levalbuterol, RESOURCE THICKENUP CLEAR  Physical Exam: Physical Exam             General: not as alert not as awake, appears with noisy breathing  HEENT: No bruits, no goiter, no JVD Heart: Regular rate and rhythm. No murmur appreciated. Lungs: shallow breathing.  Abdomen: Soft, nontender, nondistended, positive bowel sounds.  Ext: No significant edema Skin: Warm and dry  Vital Signs: BP 134/64 mmHg  Pulse 59  Temp(Src) 98.5 F (36.9 C) (Oral)  Resp 18  Ht '5\' 9"'  (1.753 m)  Wt 62 kg (136 lb 11 oz)  BMI 20.18 kg/m2  SpO2 98% SpO2: SpO2: 98 % O2 Device: O2 Device: Nasal Cannula O2 Flow Rate: O2 Flow Rate (L/min): 4 L/min  Intake/output summary:   Intake/Output Summary (Last 24 hours) at 06/08/15 1008 Last data filed at 06/07/15 2122  Gross per 24 hour  Intake    360 ml  Output    600 ml  Net   -240 ml   LBM: Last BM Date: 06/05/15 Baseline Weight: Weight: 63.504 kg (140 lb) Most recent weight: Weight: 62 kg (136 lb 11 oz)       Palliative Assessment/Data: Flowsheet Rows        Most Recent Value   Intake Tab    Referral Department  Hospitalist   Unit at Time of Referral  Med/Surg Unit   Palliative Care Primary Diagnosis  Neurology   Date Notified  06/02/15   Palliative Care Type  New Palliative care   Reason for referral  Clarify Goals of Care   Date of Admission  06/02/15   Date first seen by Palliative Care  06/03/15   # of days Palliative referral response time  1 Day(s)   # of days IP prior to Palliative referral  0   Clinical Assessment    Palliative Performance Scale Score  50%   Pain Max last 24 hours  3   Pain Min Last 24 hours  0   Psychosocial & Spiritual Assessment    Palliative Care Outcomes    Patient/Family meeting held?  Yes   Who was at the meeting?  Patients daughter/poa   Palliative Care Outcomes  Clarified goals of care, Changed CPR status      Additional Data Reviewed: CBC    Component Value  Date/Time   WBC 4.9 06/02/2015 0247   RBC 3.66* 06/02/2015 0247   HGB 9.8* 06/02/2015 0247   HCT 32.7* 06/02/2015 0247   PLT 124* 06/02/2015 0247   MCV 89.3 06/02/2015 0247   MCH 26.8 06/02/2015 0247   MCHC 30.0 06/02/2015 0247   RDW 16.7* 06/02/2015 0247   LYMPHSABS 1.1 06/02/2015 0247   MONOABS 0.5 06/02/2015 0247   EOSABS 0.1 06/02/2015 0247   BASOSABS 0.0 06/02/2015 0247    CMP     Component Value Date/Time   NA 138 06/02/2015 0247   K 4.0 06/02/2015 0247   CL 105 06/02/2015 0247   CO2 23 06/02/2015 0247   GLUCOSE 115* 06/02/2015 0247   BUN 13 06/02/2015 0247   CREATININE 0.95 06/02/2015 0247   CALCIUM 8.3* 06/02/2015 0247   PROT 6.0* 06/02/2015 0247   ALBUMIN 2.7* 06/02/2015 0247   AST 23 06/02/2015 0247   ALT 8* 06/02/2015 0247   ALKPHOS 98 06/02/2015 0247   BILITOT 0.7 06/02/2015 0247   GFRNONAA >60 06/02/2015 0247   GFRAA >60 06/02/2015 0247       Problem List:  Patient Active Problem List   Diagnosis Date Noted  . Encounter for palliative care   . Palliative care encounter   . Goals of care, counseling/discussion   . CAP (community acquired pneumonia) 06/02/2015  . FTT (failure to thrive) in adult 06/02/2015  . SIRS (systemic inflammatory response syndrome) ([place]) 06/02/2015  . Protein-calorie malnutrition, severe 06/02/2015  . Dementia with behavioral disturbance 12/24/2014  . Substance abuse 12/24/2014  . H/O personality disorder 12/24/2014  . Chronic diastolic CHF (congestive heart failure) ([place]) 07/02/2014  . Atrial fibrillation ([place]) 01/01/2014  .  Tachy-brady syndrome ([place]) 01/01/2014  . Exertional dyspnea 01/01/2014  . Acute encephalopathy 12/12/2013  . Dehydration 12/12/2013  . Fall 12/12/2013  . Dysphagia, unspecified(787.20) 12/12/2013  . Compression fracture of L2 ([place]) 12/12/2013  . Near syncope 11/07/2013  . Dementia 11/07/2013  . Esophageal dysphagia 06/20/2013  . Esophageal ring 06/20/2013  . GERD (gastroesophageal reflux  disease) 06/20/2013  . History of adenomatous polyp of colon 06/20/2013  . Alzheimer's dementia with behavioral disturbance 03/08/2013  . AA (alcohol abuse) 03/08/2013  . Absolute anemia 03/08/2013  . CAFL (chronic airflow limitation) ([place]) 03/08/2013  . Abnormal LFTs 03/08/2013  . Family history of colonic polyps 03/08/2013  . A-fib ([place]) 03/08/2013  . H/O malignant neoplasm of prostate 03/08/2013  . BP (high blood pressure) 03/08/2013  . HLD (hyperlipidemia) 03/08/2013  . Hypomagnesemia 03/08/2013  . Osteopenia 03/08/2013  . Artificial cardiac pacemaker 03/08/2013  . Multiple rib fractures 03/24/2012  . Fall down steps 03/24/2012  . Altered mental status 03/24/2012  . COPD (chronic obstructive pulmonary disease) ([place]) 03/24/2012  . Hypertension 03/24/2012  . Spinal stenosis of lumbar region with radiculopathy 03/24/2012  . Fracture of lumbar spine ([place]) 10/18/2007  . Incarcerated incisional hernia 03/27/2007     Palliative Care Assessment & Plan    1.Code Status:  DNR    Code Status Orders        Start     Ordered   06/03/15 1923  Do not attempt resuscitation (DNR)   Continuous    Question Answer Comment  In the event of cardiac or respiratory ARREST Do not call a "code blue"   In the event of cardiac or respiratory ARREST Do not perform Intubation, CPR, defibrillation or ACLS   In the event of cardiac or respiratory ARREST Use medication by any route, position, wound care, and other measures to relive pain and suffering. May use oxygen, suction and manual treatment of airway obstruction as needed for comfort.      06/03/15 1922    Code Status History    Date Active Date Inactive Code Status Order ID Comments User Context   06/02/2015  1:59 AM 06/03/2015  7:22 PM Full Code [id number]  Eugenie Filler, MD Inpatient   12/12/2013 10:30 AM 12/16/2013  4:22 PM DNR [id number]  Eugenie Filler, MD Inpatient   11/08/2013 12:03 AM 11/09/2013  6:27 PM Full Code [id number]  Allyne Gee, MD Inpatient   03/24/2012  2:56 AM 03/24/2012  4:32 PM Full Code [id number]  Theressa Millard, MD Inpatient    Advance Directive Documentation        Most Recent Value   Type of Advance Directive  Living will   Pre-existing out of facility DNR order (yellow form or pink MOST form)     "MOST" Form in Place?         2. Goals of Care/Additional Recommendations:  Patient with current PNA and recurrent aspiration risk.  patient continues to aspirate. Goals of care outlined again to focus exclusively on comfort. Consider enrollment in hospice, hospice consultation. Discussed with daughter extensively. She agrees.    Limitations on Scope of Treatment: No Artificial Feeding  Psycho-social Needs: Caregiving  Support/Resources  3. Symptom Management:       1.Dysphagia: chronic aspiration risk.  Discussed development of diet for comfort with SLP.  Appreciate SLP assistance.  2. Confusion: Continue txt of underlying infection.  4. Palliative Prophylaxis:   Aspiration, Bowel Regimen, Delirium Protocol and Frequent Pain Assessment  5. Prognosis: days  6. Discharge Planning:  ?residential hospice    Care plan was discussed with patient's daughter, Dr Eliseo Squires, CSW Alben Spittle and  Erling Conte from Hospice.   Thank you for allowing the Palliative Medicine Team to assist in the care of this patient.   Time In: 0900 Time Out: 0925 Total Time 25 Prolonged Time Billed no        Loistine Chance, MD  06/08/2015, 10:08 AM  Please contact Palliative Medicine Team phone at [phone] for questions and concerns.

## 2015-06-08 NOTE — Clinical Social Work Note (Signed)
CSW contacted by Erling Conte with [facility] and informed that they can take patient today. Daughter informed by attending MD, admissions paperwork completed for [facility] and transport called. Daughter and patient's wife at the bedside. CSW signing off as patient discharging to residential [facility] today.   Jerry Kerr, MSW, LCSW Licensed Clinical Social Worker [place] [phone]

## 2015-06-08 NOTE — Consult Note (Signed)
[facility] Liaison: Met with patient's spouse, daughter and son to complete paper work for transfer to [facility] today. Dr. Orpah Melter to assume care. Daughter [name] signed forms. Have made RN and CSW aware paper work is complete and discharge summary has been faxed.   RN Please call report to [phone].  Thank you. Erling Conte, [place]

## 2015-06-08 NOTE — Clinical Social Work Note (Addendum)
Clinical Social Work Assessment  Patient Details  Name: Jerry Kerr MRN: [id number] Date of Birth: 07-07-1933  Date of referral:  06/06/15               Reason for consult:  Facility Placement                Permission sought to share information with:  Other (Patient only oriented to person and place - CSW talked with daughter) Permission granted to share information::  No  Name::     Jerry Kerr  Agency::     Relationship::  Daughter  Contact Information:  [phone]  Housing/Transportation Living arrangements for the past 2 months:  Apartment Source of Information:  Adult Children Insurance account manager) Patient Interpreter Needed:  None Criminal Activity/Legal Involvement Pertinent to Current Situation/Hospitalization:  No - Comment as needed Significant Relationships:  Adult Children, Spouse Lives with:  Spouse Do you feel safe going back to the place where you live?  No (Daughter understands that patient very weak (near end of life) and cannot return home.) Need for family participation in patient care:  Yes (Comment)  Care giving concerns:  Daughter understands that patient at end of life. Hospice placement requested, however [facility] has no beds and daughter does not want to go outside of [facility] because of her mother. Daughter agreeable to SNF until a bed becomes available at [facility].   Social Worker assessment / plan:  On 06/07/15 CSW talked with daughter about patient discharging to a skilled facility. Daughter provided brief history of patient and how he came to live in [place]. Patient and wife lived in [place], [place] and he has been a heavy drinker for many years. Patient has ben involuntarily committed to a hospital in [facility] for about 2 weeks, and has been lived in [place] at [facility] with caregivers and [facility] with caregivers. Per daughter he was kicked out of both facilities due to being combative and exiting behaviors. Her mother moved to  [place] 2 weeks ago and moved in with patient. Per daughter, her dad's driver's license was revoked approximately 2 1/2 years ago. Per daughter she has seen a sharp decline in her dad starting about 3 weeks ago and exhibited cognitive and speech problems, as well as problems with ambulation and swallowing. CSW provided Ms. Sydnor with skilled facility list for [facility] and explained the facility search process.   Employment status:  Retired Health visitor, Futures trader PT Recommendations:  [place] / Referral to community resources:  [place] (Daughter provided with SNF list for [facility])  Patient/Family's Response to care: No concerns expressed by daughter regarding patient's care during hospitalization.  Patient/Family's Understanding of and Emotional Response to Diagnosis, Current Treatment, and Prognosis:  Daughter understands patient's prognosis is days/weeks.  Emotional Assessment Appearance:  Appears stated age (CSW looked in on patient, however talked with daughter outside of the room at [place] work station as patient was sleeping) Attitude/Demeanor/Rapport:  Unable to Assess Affect (typically observed):  Unable to Assess Orientation:  Oriented to Self, Oriented to Place Alcohol / Substance use:  Tobacco Use, Alcohol Use (Patient reported that he quits smoking and drinks alcohol. Patient also reported that he does not use illicit drugs.) Psych involvement (Current and /or in the community):  No (Comment)  Discharge Needs  Concerns to be addressed:  Discharge Planning Concerns Readmission within the last 30 days:  No Current discharge risk:  None Barriers to  Discharge:  No Barriers Identified   Sable Feil, LCSW 06/08/2015, 1:05 PM

## 2015-06-08 NOTE — Progress Notes (Signed)
Patient for discharge to [facility]. Report given to [facility] using SBAR. All questions were answered.

## 2015-06-08 NOTE — Discharge Summary (Signed)
Physician Discharge Summary  Jerry Kerr [id number] DOB: May 29, 1933 DOA: 06/01/2015  PCP: [name], MD  Admit date: 06/01/2015 Discharge date: 06/08/2015  Time spent: 35 minutes  Recommendations for Outpatient Follow-up:  1. To [facility]   Discharge Diagnoses:  Principal Problem:   CAP (community acquired pneumonia) Active Problems:   Fall down steps   COPD (chronic obstructive pulmonary disease) (HCC)   Hypertension   GERD (gastroesophageal reflux disease)   Dementia   Acute encephalopathy   Dehydration   Fall   Atrial fibrillation (HCC)   Alzheimer's dementia with behavioral disturbance   HLD (hyperlipidemia)   Chronic diastolic CHF (congestive heart failure) ([place])   H/O personality disorder   FTT (failure to thrive) in adult   SIRS (systemic inflammatory response syndrome) (HCC)   Protein-calorie malnutrition, severe   Palliative care encounter   Goals of care, counseling/discussion   Encounter for palliative care   Discharge Condition: improved  Diet recommendation: clear liquids- nectar thick  Filed Weights   06/05/15 2237 06/06/15 2028 06/07/15 2121  Weight: 63.1 kg (139 lb 1.8 oz) 63.5 kg (139 lb 15.9 oz) 62 kg (136 lb 11 oz)    History of present illness:  Jerry Kerr is a 82 y.o. male  With history of Alzheimer's dementia, COPD, hypertension, status post permanent pacemaker, depression/anxiety, gastroesophageal reflux disease, chronic diastolic heart failure off diuretics will approximate 6 months per family, history of atrial fibrillation presented to the ED with worsening shortness of breath, worsening confusion, decreased oral intake, generalized weakness to the point whereby patient is no longer ambulating used to ambulate with the aid of a walker. Patient lives in an apartment with 24-hour care. Patient with a history of dementia and a such history was provided by patient's daughter and wife who are at bedside. Per family patient over the  past 2 weeks has been getting progressively confused with some worsening shortness of breath, productive cough, fever noted in the emergency room with a temperature 101.4, wheezing, worsening weakness. Patient denies any chills, no nausea, no vomiting, no abdominal pain, no diarrhea, no constipation. No melena, no hematemesis, no hematochezia. Per Family patient has been falling more frequently to the point where he sprained his ankle. Patient with poor oral intake with worsening weakness and worsening deconditioning. Per daughter patient refused to take his diuretics over the past 6 months and also stopped taking his potassium pills. Per daughter patient noncompliant with a cardiac diet. Patient was seen in the emergency room temperature 101.4 with a respiratory rate as high as 30. Comprehensive metabolic profile obtained with a chloride of 99 albumin of 3.3 otherwise within normal limits. BNP was 231.1. Lactic acid level was within normal limits. CBC had a hemoglobin of 11.8 otherwise was within normal limits. EKG with normal sinus rhythm with nonspecific T-wave changes. Patient was given IV Rocephin IV azithromycin empirically for clinical pneumonia. [facility] hospitalist was called to admit the patient for further evaluation and management.  Hospital Course:  CAP (community acquired pneumonia)/metapneumovirus + - comfort focus -? aspiration -finished course of abx   Dysphagia - comfort feeds with nectar thick -high risk of aspiration-- required suctioning   COPD (chronic obstructive pulmonary disease) (HCC) - Continue current medication regimen, stable   Hypertension  GERD (gastroesophageal reflux disease) dementia  Dehydration  Fall  Atrial fibrillation (HCC)  HLD (hyperlipidemia)  Chronic diastolic CHF (congestive heart failure) ([place])  -comfort focus  Protein-calorie malnutrition, severe - Please see discussion  above  Procedures:  Consultations:  Palliative care  Discharge Exam: Filed Vitals:   06/08/15 0617 06/08/15 0914  BP: 128/52 134/64  Pulse: 60 59  Temp: 98.5 F (36.9 C) 98.5 F (36.9 C)  Resp: 18 18    General: awake, asking for more "time"   Discharge Instructions   Discharge Instructions    Discharge instructions    Complete by:  As directed   Comfort feeds: clear liquids-- nectar consistancy DNR Foley placed for end of life care PRN O2 as needed for comfort          Current Discharge Medication List    START taking these medications   Details  glycopyrrolate (ROBINUL) 0.2 MG/ML injection Inject 2 mLs (0.4 mg total) into the vein every 4 (four) hours as needed (secretions). Qty: 1 mL      CONTINUE these medications which have NOT CHANGED   Details  albuterol (PROVENTIL HFA;VENTOLIN HFA) 108 (90 BASE) MCG/ACT inhaler Inhale 2 puffs into the lungs every 6 (six) hours as needed for wheezing or shortness of breath.     albuterol (PROVENTIL) (2.5 MG/3ML) 0.083% nebulizer solution Take 2.5 mg by nebulization every 6 (six) hours as needed for wheezing or shortness of breath.    divalproex (DEPAKOTE SPRINKLE) 125 MG capsule Take 500 mg by mouth 2 (two) times daily.      STOP taking these medications     ipratropium (ATROVENT) 0.02 % nebulizer solution      ipratropium-albuterol (DUONEB) 0.5-2.5 (3) MG/3ML SOLN      memantine (NAMENDA) 10 MG tablet      QUEtiapine (SEROQUEL) 100 MG tablet      sertraline (ZOLOFT) 100 MG tablet      traMADol (ULTRAM) 50 MG tablet      omeprazole (PRILOSEC) 20 MG capsule      moxifloxacin (VIGAMOX) 0.5 % ophthalmic solution        Allergies  Allergen Reactions  . Hydrocodone Other (See Comments)    Confusion. agitation      The results of significant diagnostics from this hospitalization (including imaging, microbiology, ancillary and laboratory) are listed below for reference.    Significant Diagnostic  Studies: Dg Chest 1 View  06/01/2015  CLINICAL DATA:  Confusion, weakness, shaking, shortness of breath recently, history cancer, asthma, CHF, hypertension, COPD, dementia EXAM: CHEST 1 VIEW COMPARISON:  12/13/2013 FINDINGS: Kyphotic positioning. LEFT subclavian pacemaker leads project over RIGHT atrium and RIGHT ventricle. Stable heart size and mediastinal contours. Minimal bibasilar atelectasis. Probable underlying emphysematous changes. No definite infiltrate, pleural effusion or gross pneumothorax, though portions of the apices are obscured by the patient's head. IMPRESSION: Probable COPD changes with minimal bibasilar atelectasis. Electronically Signed   By: Lavonia Dana M.D.   On: 06/01/2015 19:05   Dg Ankle Complete Right  05/23/2015  CLINICAL DATA:  Fall yesterday with right ankle pain, initial encounter EXAM: RIGHT ANKLE - COMPLETE 3+ VIEW COMPARISON:  None. FINDINGS: No acute fracture or dislocation is noted. A well corticated calcification is noted in the anterior aspect of the foot which may be related to prior avulsion fracture. No acute abnormality is seen. IMPRESSION: No acute abnormality noted. Electronically Signed   By: Inez Catalina M.D.   On: 05/23/2015 18:12   Ct Head Wo Contrast  06/01/2015  CLINICAL DATA:  Altered mental status.  History of dementia. EXAM: CT HEAD WITHOUT CONTRAST TECHNIQUE: Contiguous axial images were obtained from the base of the skull through the vertex without intravenous contrast. COMPARISON:  02/14/2015 FINDINGS: Sinuses/Soft tissues:  Minimal mucosal thickening of bilateral maxillary sinuses and the left sphenoid sinus. Hypoplastic frontal sinuses. Clear mastoid air cells. Intracranial: Mild low density in the periventricular white matter likely related to small vessel disease. Cerebral and cerebellar atrophy. No mass lesion, hemorrhage, hydrocephalus, acute infarct, intra-axial, or extra-axial fluid collection. IMPRESSION: 1.  No acute intracranial abnormality. 2.   Cerebral atrophy and small vessel ischemic change. 3. Sinus disease. Electronically Signed   By: Abigail Miyamoto M.D.   On: 06/01/2015 23:44   Dg Swallowing Func-speech Pathology  06/04/2015  Objective Swallowing Evaluation: Type of Study: MBS-Modified Barium Swallow Study Patient Details Name: Constant Freyre MRN: [id number] Date of Birth: [birth year] Today's Date: 06/04/2015 Time: SLP Start Time (ACUTE ONLY): 1138-SLP Stop Time (ACUTE ONLY): 1147 SLP Time Calculation (min) (ACUTE ONLY): 9 min Past Medical History: Past Medical History Diagnosis Date . Asthma  . Dementia  . Back pain  . COPD (chronic obstructive pulmonary disease) ([place])  . Hypertension  . Pacemaker  . Anxiety  . Depression  . Cancer (HCC)    SKIN, PROSTATE . GERD (gastroesophageal reflux disease)  . Substance abuse    ETOH . Dysphagia, unspecified(787.20) 12/12/2013 . Swallowing difficulty    SEES  DR. PYRTLE HAS HX ESOPHAGEAL DIL . Barrett's esophagus  . CHF (congestive heart failure) (HCC)    chronic diastolic HF . Dysrhythmia    atrial fibrillation Past Surgical History: Past Surgical History Procedure Laterality Date . Neck surgery   . Upper gastrointestinal endoscopy   . Colonoscopy   . Ankle surgery   . Skin graft   . Pacemaker insertion   . Tonsillectomy   . Esophageal dilation   . Kyphoplasty Bilateral 05/05/2014   Procedure: KYPHOPLASTY LUMBAR TWO;  Surgeon: Consuella Lose, MD;  Location: [facility];  Service: Neurosurgery;  Laterality: Bilateral; HPI: With history of Alzheimer's dementia, COPD, hypertension, status post permanent pacemaker, depression/anxiety, gastroesophageal reflux disease, chronic diastolic heart failure off diuretics will approximate 6 months per family, history of atrial fibrillation presented to the ED with worsening shortness of breath, worsening confusion, decreased oral intake, generalized weakness to the point whereby patient is no longer ambulating used to ambulate with the aid of a walker. Patient lives in an  apartment with 24-hour care. Patient with a history of dementia and a such history was provided by patient's daughter and wife who are at bedside. Subjective: pt alert but with difficulty following commands consistently Assessment / Plan / Recommendation CHL IP CLINICAL IMPRESSIONS 06/04/2015 Therapy Diagnosis Severe pharyngeal phase dysphagia Clinical Impression Pt continues to have a severe pharyngeal dysphagia due to both structural component from cervical hardware as well as decreased overall strength from acute illness. Hardware from prior surgery impedes epiglottic deflection and bolus flow through the pharynx, with severe residue remaining in the valleculae with spoonfuls of nectar thick liquids. Residue is penetrated and ultimately aspirated after the swallow, particularly as it mixed with subsequent trials of thin liquids by spoon. Pt was able to expectorate aspirates with cued cough, but despite Mod-Max cues for coughing/throat clearing, pt was not able to clear residue from his pharynx, which was then repeatedly penetrated. Per review of previous MBS from 2015, this appears to be a chronic problem for which pt has compensated in the past by using a chin tuck. Unfortunately, pt is not cognitively able to complete this strategy and based on performance today, would not be safe for any PO intake. He would also be at a high risk for malnutrition/dehydration, as minimal mounts  of POs even reached his UES. Given chronic nature of dysphagia as well as progressive nature of his dementia, pt may be appropriate for further discussions about comfort feeds. Impact on safety and function Severe aspiration risk;Risk for inadequate nutrition/hydration   CHL IP TREATMENT RECOMMENDATION 06/04/2015 Treatment Recommendations Therapy as outlined in treatment plan below   Prognosis 06/04/2015 Prognosis for Safe Diet Advancement Guarded Barriers to Reach Goals Cognitive deficits;Time post onset;Severity of deficits  Barriers/Prognosis Comment -- CHL IP DIET RECOMMENDATION 06/04/2015 SLP Diet Recommendations NPO Liquid Administration via -- Medication Administration Via alternative means Compensations -- Postural Changes --   CHL IP OTHER RECOMMENDATIONS 06/04/2015 Recommended Consults -- Oral Care Recommendations Oral care QID Other Recommendations --   CHL IP FOLLOW UP RECOMMENDATIONS 06/04/2015 Follow up Recommendations (No Data)   CHL IP FREQUENCY AND DURATION 06/04/2015 Speech Therapy Frequency (ACUTE ONLY) min 2x/week Treatment Duration 2 weeks      CHL IP ORAL PHASE 06/04/2015 Oral Phase WFL Oral - Pudding Teaspoon -- Oral - Pudding Cup -- Oral - Honey Teaspoon -- Oral - Honey Cup -- Oral - Nectar Teaspoon -- Oral - Nectar Cup -- Oral - Nectar Straw -- Oral - Thin Teaspoon -- Oral - Thin Cup -- Oral - Thin Straw -- Oral - Puree -- Oral - Mech Soft -- Oral - Regular -- Oral - Multi-Consistency -- Oral - Pill -- Oral Phase - Comment --  CHL IP PHARYNGEAL PHASE 06/04/2015 Pharyngeal Phase Impaired Pharyngeal- Pudding Teaspoon -- Pharyngeal -- Pharyngeal- Pudding Cup -- Pharyngeal -- Pharyngeal- Honey Teaspoon -- Pharyngeal -- Pharyngeal- Honey Cup -- Pharyngeal -- Pharyngeal- Nectar Teaspoon Delayed swallow initiation-vallecula;Reduced epiglottic inversion;Reduced anterior laryngeal mobility;Reduced laryngeal elevation;Reduced airway/laryngeal closure;Reduced tongue base retraction;Penetration/Apiration after swallow;Pharyngeal residue - valleculae;Pharyngeal residue - cp segment;Compensatory strategies attempted (with notebox) Pharyngeal Material enters airway, passes BELOW cords without attempt by patient to eject out (silent aspiration) Pharyngeal- Nectar Cup -- Pharyngeal -- Pharyngeal- Nectar Straw -- Pharyngeal -- Pharyngeal- Thin Teaspoon Reduced epiglottic inversion;Reduced anterior laryngeal mobility;Reduced laryngeal elevation;Reduced airway/laryngeal closure;Reduced tongue base retraction;Pharyngeal residue -  valleculae;Pharyngeal residue - cp segment;Compensatory strategies attempted (with notebox);Penetration/Aspiration before swallow;Delayed swallow initiation-pyriform sinuses Pharyngeal Material enters airway, CONTACTS cords and not ejected out Pharyngeal- Thin Cup -- Pharyngeal -- Pharyngeal- Thin Straw -- Pharyngeal -- Pharyngeal- Puree -- Pharyngeal -- Pharyngeal- Mechanical Soft -- Pharyngeal -- Pharyngeal- Regular -- Pharyngeal -- Pharyngeal- Multi-consistency -- Pharyngeal -- Pharyngeal- Pill -- Pharyngeal -- Pharyngeal Comment --  CHL IP CERVICAL ESOPHAGEAL PHASE 06/04/2015 Cervical Esophageal Phase (No Data) Pudding Teaspoon -- Pudding Cup -- Honey Teaspoon -- Honey Cup -- Nectar Teaspoon -- Nectar Cup -- Nectar Straw -- Thin Teaspoon -- Thin Cup -- Thin Straw -- Puree -- Mechanical Soft -- Regular -- Multi-consistency -- Pill -- Cervical Esophageal Comment -- CHL IP GO 06/02/2015 Functional Assessment Tool Used (None) Functional Limitations Swallowing Swallow Current Status [id number]) CN Swallow Goal Status [id number]) CN Swallow Discharge Status [id number]) CN Motor Speech Current Status [id number]) (None) Motor Speech Goal Status UK:060616) (None) Motor Speech Goal Status SA:931536) (None) Spoken Language Comprehension Current Status [id number]) (None) Spoken Language Comprehension Goal Status [id number]) (None) Spoken Language Comprehension Discharge Status [id number]) (None) Spoken Language Expression Current Status FP:[id number]) (None) Spoken Language Expression Goal Status [id number]) (None) Spoken Language Expression Discharge Status NF:1565649) (None) Attention Current Status [id number]) (None) Attention Goal Status EY:7266000) (None) Attention Discharge Status [id number]) (None) Memory Current Status [id number]) (None) Memory Goal Status [id number]) (None) Memory Discharge Status QC:115444) (None) Voice Current Status [id number]) (None) Voice Goal  Status [phone]) (None) Voice Discharge Status [phone]) (None) Other Speech-Language Pathology Functional Limitation [phone]) (None)  Other Speech-Language Pathology Functional Limitation Goal Status [id number]) (None) Other Speech-Language Pathology Functional Limitation Discharge Status [phone]) (None) Germain Osgood, M.A. CCC-SLP [phone] Germain Osgood 06/04/2015, 12:21 PM              Dg Swallowing Func-speech Pathology  06/02/2015  Objective Swallowing Evaluation: Type of Study: MBS-Modified Barium Swallow Study Patient Details Name: Decklyn Hains MRN: [id number] Date of Birth: [birth year] Today's Date: 06/02/2015 Time: SLP Start Time (ACUTE ONLY): 1115-SLP Stop Time (ACUTE ONLY): 1146 SLP Time Calculation (min) (ACUTE ONLY): 31 min Past Medical History: Past Medical History Diagnosis Date . Asthma  . Dementia  . Back pain  . COPD (chronic obstructive pulmonary disease) ([place])  . Hypertension  . Pacemaker  . Anxiety  . Depression  . Cancer (HCC)    SKIN, PROSTATE . GERD (gastroesophageal reflux disease)  . Substance abuse    ETOH . Dysphagia, unspecified(787.20) 12/12/2013 . Swallowing difficulty    SEES  DR. PYRTLE HAS HX ESOPHAGEAL DIL . Barrett's esophagus  . CHF (congestive heart failure) (HCC)    chronic diastolic HF . Dysrhythmia    atrial fibrillation Past Surgical History: Past Surgical History Procedure Laterality Date . Neck surgery   . Upper gastrointestinal endoscopy   . Colonoscopy   . Ankle surgery   . Skin graft   . Pacemaker insertion   . Tonsillectomy   . Esophageal dilation   . Kyphoplasty Bilateral 05/05/2014   Procedure: KYPHOPLASTY LUMBAR TWO;  Surgeon: Consuella Lose, MD;  Location: [place];  Service: Neurosurgery;  Laterality: Bilateral; HPI: With history of Alzheimer's dementia, COPD, hypertension, status post permanent pacemaker, depression/anxiety, gastroesophageal reflux disease, chronic diastolic heart failure off diuretics will approximate 6 months per family, history of atrial fibrillation presented to the ED with worsening shortness of breath, worsening confusion, decreased oral intake, generalized weakness  to the point whereby patient is no longer ambulating used to ambulate with the aid of a walker. Patient lives in an apartment with 24-hour care. Patient with a history of dementia and a such history was provided by patient's daughter and wife who are at bedside. Subjective: Pt in bed sleeping, but will arouse and following commands with eyes closed. Assessment / Plan / Recommendation CHL IP CLINICAL IMPRESSIONS 06/02/2015 Therapy Diagnosis Mild oral phase dysphagia;Severe pharyngeal phase dysphagia Clinical Impression Pt lethargic, but was able to be awakened to follow commands and elicit swallow. Pt has h/o cervical fusion c3-c7 (hardware anterior and posterior to spine). Pt has mild oral dysphagia noted with all consistencies tested evidenced by oral and lingual residue and poor bolus control. Pt also has severe pharyngeal dysphagia. Pt had reduced cricophargeal opening resulting in backflow into pharynx and larynx across all tested consistencies. Pt's cervical hardward seems to play a part in this dysfunction. Pt did have a delayed throat clear or cough with each bolus but unable to clear and protect airway. Solids and pills were not tested due to the high risk for aspiration. Recommend pt be strict NPO. MBS would need to be repeated at a later date if PO diet. Prognosis for improvement is poor due to deconditioning and cognition. Impact on safety and function Severe aspiration risk;Risk for inadequate nutrition/hydration   CHL IP TREATMENT RECOMMENDATION 06/02/2015 Treatment Recommendations Patient unable to participate in swallow therapy at this time   Prognosis 06/02/2015 Prognosis for Safe Diet Advancement Guarded Barriers to Reach Goals Cognitive deficits;Severity of deficits  Barriers/Prognosis Comment -- CHL IP DIET RECOMMENDATION 06/02/2015 SLP Diet Recommendations NPO Liquid Administration via -- Medication Administration -- Compensations -- Postural Changes --   CHL IP OTHER RECOMMENDATIONS 11/08/2013 Recommended  Consults -- Oral Care Recommendations -- Other Recommendations Order thickener from pharmacy   CHL IP FOLLOW UP RECOMMENDATIONS 12/14/2013 Follow up Recommendations Home health SLP   CHL IP FREQUENCY AND DURATION 12/12/2013 Speech Therapy Frequency (ACUTE ONLY) min 1 x/week Treatment Duration 1 week      CHL IP ORAL PHASE 06/02/2015 Oral Phase -- Oral - Pudding Teaspoon -- Oral - Pudding Cup -- Oral - Honey Teaspoon -- Oral - Honey Cup -- Oral - Nectar Teaspoon -- Oral - Nectar Cup -- Oral - Nectar Straw -- Oral - Thin Teaspoon Lingual/palatal residue Oral - Thin Cup Lingual/palatal residue;Piecemeal swallowing Oral - Thin Straw -- Oral - Puree -- Oral - Mech Soft -- Oral - Regular -- Oral - Multi-Consistency -- Oral - Pill -- Oral Phase - Comment --  CHL IP PHARYNGEAL PHASE 06/02/2015 Pharyngeal Phase Impaired Pharyngeal- Pudding Teaspoon -- Pharyngeal -- Pharyngeal- Pudding Cup -- Pharyngeal -- Pharyngeal- Honey Teaspoon -- Pharyngeal -- Pharyngeal- Honey Cup -- Pharyngeal -- Pharyngeal- Nectar Teaspoon -- Pharyngeal -- Pharyngeal- Nectar Cup -- Pharyngeal -- Pharyngeal- Nectar Straw -- Pharyngeal -- Pharyngeal- Thin Teaspoon -- Pharyngeal -- Pharyngeal- Thin Cup -- Pharyngeal -- Pharyngeal- Thin Straw -- Pharyngeal -- Pharyngeal- Puree -- Pharyngeal -- Pharyngeal- Mechanical Soft -- Pharyngeal -- Pharyngeal- Regular -- Pharyngeal -- Pharyngeal- Multi-consistency -- Pharyngeal -- Pharyngeal- Pill -- Pharyngeal -- Pharyngeal Comment --  CHL IP CERVICAL ESOPHAGEAL PHASE 06/02/2015 Cervical Esophageal Phase Impaired Pudding Teaspoon Reduced cricopharyngeal relaxation;Esophageal backflow into the pharynx;Esophageal backflow into the larynx Pudding Cup -- Honey Teaspoon -- Honey Cup -- Nectar Teaspoon Reduced cricopharyngeal relaxation;Esophageal backflow into the larynx;Esophageal backflow into the pharynx Nectar Cup -- Nectar Straw -- Thin Teaspoon Reduced cricopharyngeal relaxation;Esophageal backflow into the  pharynx;Esophageal backflow into the larynx Thin Cup -- Thin Straw -- Puree -- Mechanical Soft -- Regular -- Multi-consistency -- Pill -- Cervical Esophageal Comment -- CHL IP GO 06/02/2015 Functional Assessment Tool Used (None) Functional Limitations Swallowing Swallow Current Status [id number]) CN Swallow Goal Status MB:535449) CN Swallow Discharge Status [id number]) CN Motor Speech Current Status [id number]:4190269) (None) Motor Speech Goal Status [id number]:6384036) (None) Motor Speech Goal Status SG:4719142) (None) Spoken Language Comprehension Current Status XK:431433) (None) Spoken Language Comprehension Goal Status JI:2804292) (None) Spoken Language Comprehension Discharge Status [phone]) (None) Spoken Language Expression Current Status PD:6807704) (None) Spoken Language Expression Goal Status [id number]) (None) Spoken Language Expression Discharge Status [phone]) (None) Attention Current Status LV:671222) (None) Attention Goal Status FV:388293) (None) Attention Discharge Status VJ:2303441) (None) Memory Current Status AE:130515) (None) Memory Goal Status GI:463060) (None) Memory Discharge Status UZ:5226335) (None) Voice Current Status PO:3169984) (None) Voice Goal Status SQ:4094147) (None) Voice Discharge Status [id number]:2984163) (None) Other Speech-Language Pathology Functional Limitation [phone]) (None) Other Speech-Language Pathology Functional Limitation Goal Status [id number]) (None) Other Speech-Language Pathology Functional Limitation Discharge Status [phone]) (None) Charlynne Cousins Ward, MA, CCC-SLP 06/02/2015 11:49 AM               Microbiology: Recent Results (from the past 240 hour(s))  Culture, blood (routine x 2)     Status: None   Collection Time: 06/01/15  6:25 PM  Result Value Ref Range Status   Specimen Description BLOOD RIGHT ANTECUBITAL  Final   Special Requests BOTTLES DRAWN AEROBIC ONLY 10CC  Final   Culture NO GROWTH 5 DAYS  Final  Report Status 06/06/2015 FINAL  Final  Culture, blood (routine x 2)     Status: None   Collection Time: 06/01/15  9:11 PM  Result Value Ref Range  Status   Specimen Description BLOOD LEFT ANTECUBITAL  Final   Special Requests BOTTLES DRAWN AEROBIC AND ANAEROBIC 5CC  Final   Culture NO GROWTH 5 DAYS  Final   Report Status 06/06/2015 FINAL  Final  Urine culture     Status: None   Collection Time: 06/01/15  9:54 PM  Result Value Ref Range Status   Specimen Description URINE, CATHETERIZED  Final   Special Requests NONE  Final   Culture NO GROWTH 2 DAYS  Final   Report Status 06/03/2015 FINAL  Final  Respiratory virus panel     Status: Abnormal   Collection Time: 06/02/15  2:12 AM  Result Value Ref Range Status   Respiratory Syncytial Virus A Negative Negative Final   Respiratory Syncytial Virus B Negative Negative Final   Influenza A Negative Negative Final   Influenza B Negative Negative Final   Parainfluenza 1 Negative Negative Final   Parainfluenza 2 Negative Negative Final   Parainfluenza 3 Negative Negative Final   Metapneumovirus Positive (A) Negative Final   Rhinovirus Negative Negative Final   Adenovirus Negative Negative Final    Comment: (NOTE) Performed At: [facility] [place], [place] [id number] Lindon Romp MD [id number]   MRSA PCR Screening     Status: Abnormal   Collection Time: 06/02/15  2:18 AM  Result Value Ref Range Status   MRSA by PCR POSITIVE (A) NEGATIVE Final    Comment:        The GeneXpert MRSA Assay (FDA approved for NASAL specimens only), is one component of a comprehensive MRSA colonization surveillance program. It is not intended to diagnose MRSA infection nor to guide or monitor treatment for MRSA infections. RESULT CALLED TO, READ BACK BY AND VERIFIED WITH: V BROWN,RN @0501  06/02/15 MKELLY   Culture, sputum-assessment     Status: None   Collection Time: 06/02/15  2:34 AM  Result Value Ref Range Status   Specimen Description EXPECTORATED SPUTUM  Final   Special Requests NONE  Final   Sputum evaluation   Final    MICROSCOPIC FINDINGS SUGGEST THAT THIS  SPECIMEN IS NOT REPRESENTATIVE OF LOWER RESPIRATORY SECRETIONS. PLEASE RECOLLECT. Gram Stain Report Called to,Read Back By and Verified With: V BROWN,RN @[id number]  06/02/15 MKELLY    Report Status 06/02/2015 FINAL  Final  Culture, blood (routine x 2) Call MD if unable to obtain prior to antibiotics being given     Status: None   Collection Time: 06/02/15  2:47 AM  Result Value Ref Range Status   Specimen Description BLOOD RIGHT ANTECUBITAL  Final   Special Requests BOTTLES DRAWN AEROBIC AND ANAEROBIC 10CC  Final   Culture NO GROWTH 5 DAYS  Final   Report Status 06/07/2015 FINAL  Final  Culture, blood (routine x 2) Call MD if unable to obtain prior to antibiotics being given     Status: None   Collection Time: 06/02/15  2:53 AM  Result Value Ref Range Status   Specimen Description BLOOD RIGHT HAND  Final   Special Requests BOTTLES DRAWN AEROBIC ONLY [facility]  Final   Culture NO GROWTH 5 DAYS  Final   Report Status 06/07/2015 FINAL  Final     Labs: Basic Metabolic Panel:  Recent Labs Lab 06/01/15 1845 06/02/15 0247  NA 140 138  K 4.7  4.0  CL 99* 105  CO2 25 23  GLUCOSE 89 115*  BUN 12 13  CREATININE 1.09 0.95  CALCIUM 9.7 8.3*  MG  --  1.7   Liver Function Tests:  Recent Labs Lab 06/01/15 1845 06/02/15 0247  AST 27 23  ALT 9* 8*  ALKPHOS 119 98  BILITOT 0.7 0.7  PROT 7.1 6.0*  ALBUMIN 3.3* 2.7*   No results for input(s): LIPASE, AMYLASE in the last 168 hours. No results for input(s): AMMONIA in the last 168 hours. CBC:  Recent Labs Lab 06/01/15 2110 06/02/15 0247  WBC 6.4 4.9  NEUTROABS 4.2 3.2  HGB 11.8* 9.8*  HCT 39.0 32.7*  MCV 89.7 89.3  PLT 153 124*   Cardiac Enzymes: No results for input(s): CKTOTAL, CKMB, CKMBINDEX, TROPONINI in the last 168 hours. BNP: BNP (last 3 results)  Recent Labs  06/01/15 2241  BNP 231.1*    ProBNP (last 3 results)  Recent Labs  07/07/14 1025  PROBNP 274.0*    CBG:  Recent Labs Lab 06/01/15 1837 06/03/15 0719   GLUCAP 73 91       Signed:  Gaynell Eggleton U Eris Breck DO.  [facility] 06/08/2015, 11:25 AM

## 2015-06-30 DEATH — deceased

## 2015-10-03 IMAGING — CT CT NECK W/ CM
3 of 4 series · 13 of 27 positions shown, 15 images · IV contrast (omnipaque)
Comparison: None.

CLINICAL DATA: Increasing dysphagia and choking sensation with
history of C4-C7 fusion and tonsillectomy ; clinical impression from
the speech therapist's is There is cervical esophageal dysphagia due
to cervical hardware impacting the oropharyngeal function

EXAM:
CT NECK WITH CONTRAST
TECHNIQUE: Multidetector CT imaging of the neck was performed using the
standard protocol following the bolus administration of intravenous
contrast.
CONTRAST:  100mL OMNIPAQUE IOHEXOL 300 MG/ML  SOLN

[Series 4: neck st · axial · 0.38mm/px · z∈[+1021,+1115]mm · 4 of 79 slices shown, 5 images]
[im 16/79  soft-tissue]
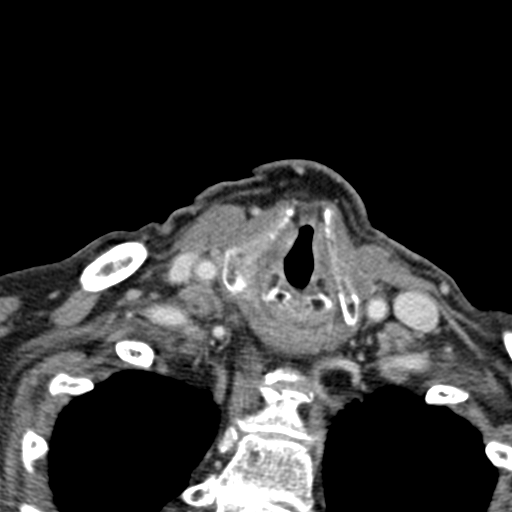
[im 16/79  bone]
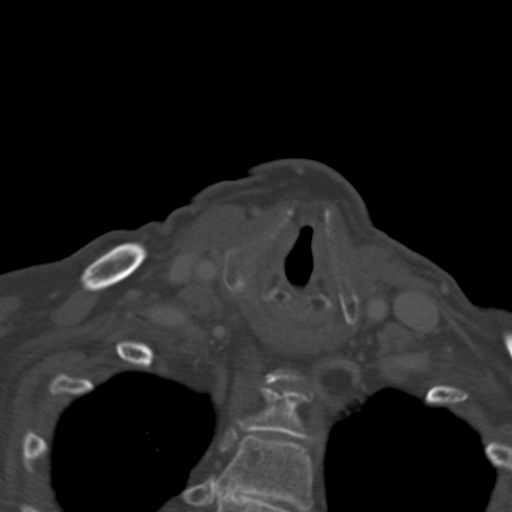
[im 32/79  bone]
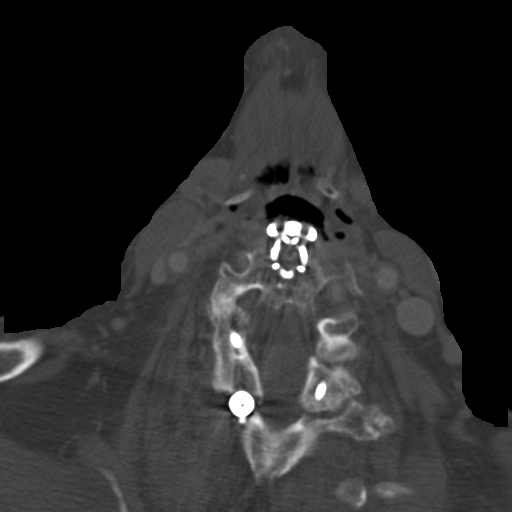
[im 47/79  bone]
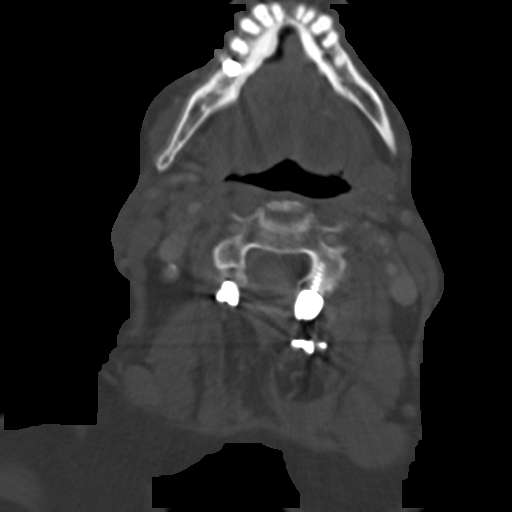
[im 63/79  bone]
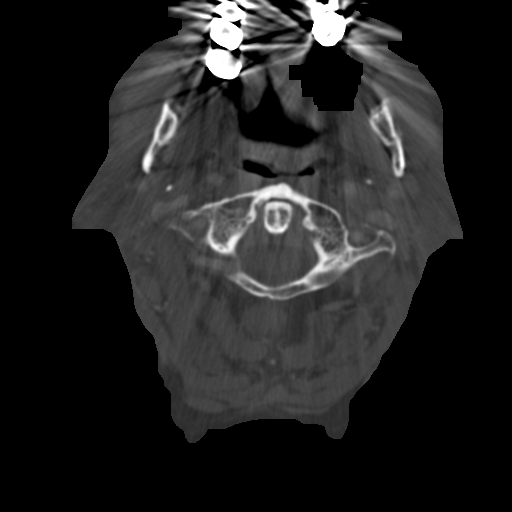

[Series 603: axial · axial · 0.38mm/px · z∈[+985,+1077]mm · 4 of 86 slices shown]
[im 18/86  bone]
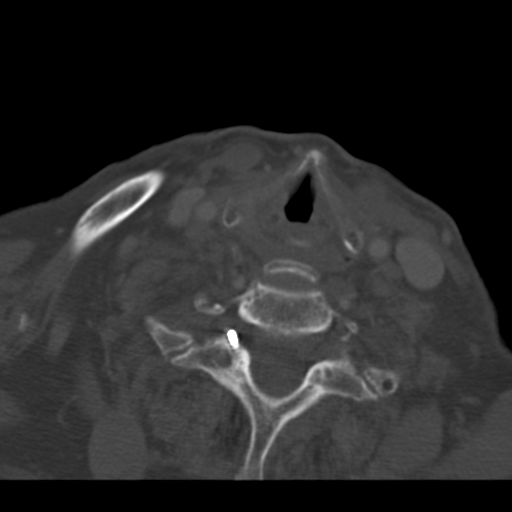
[im 35/86  bone]
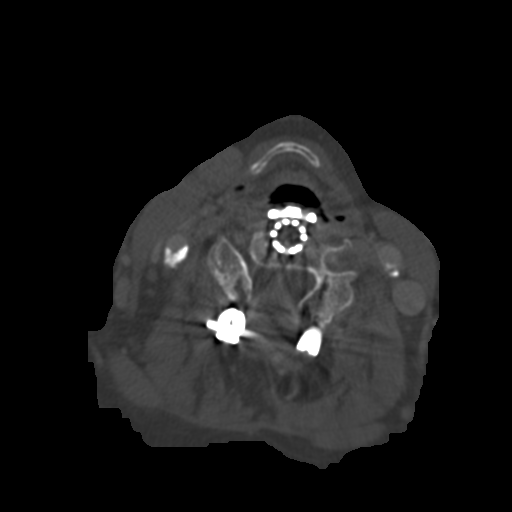
[im 52/86  bone]
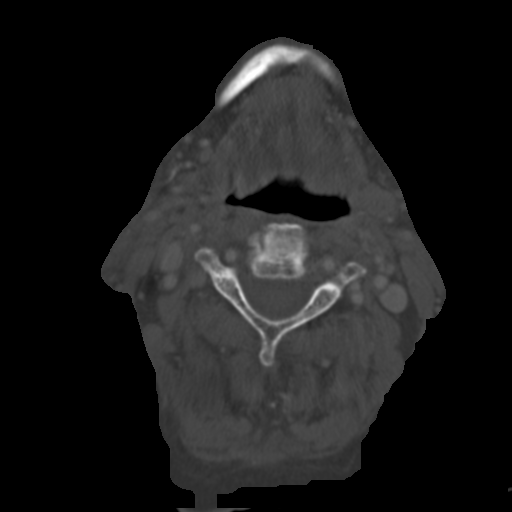
[im 69/86  bone]
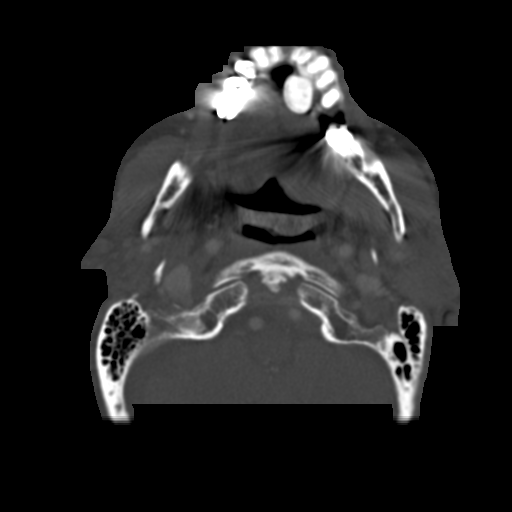

[Series 605: sagittal · sagittal · 0.38mm/px · 5 of 71 slices shown, 6 images]
[im 24/71  bone]
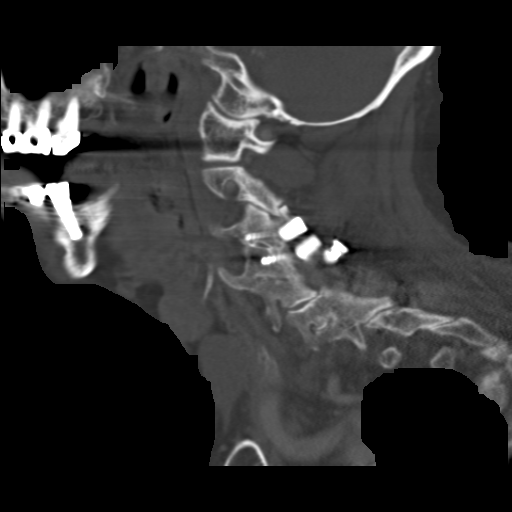
[im 30/71  bone]
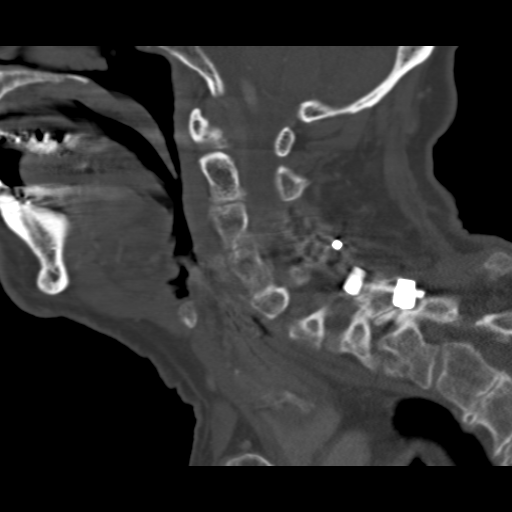
[im 36/71  soft-tissue]
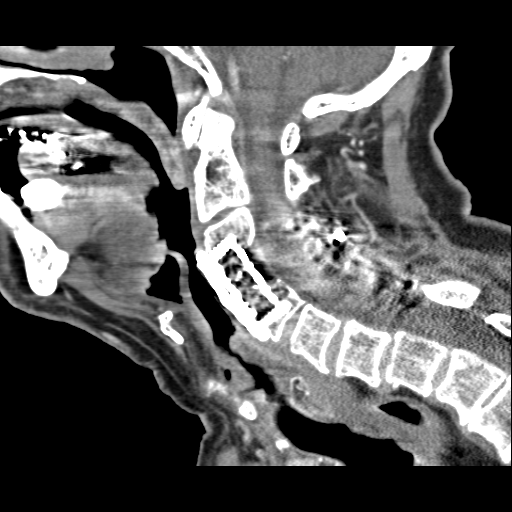
[im 36/71  bone]
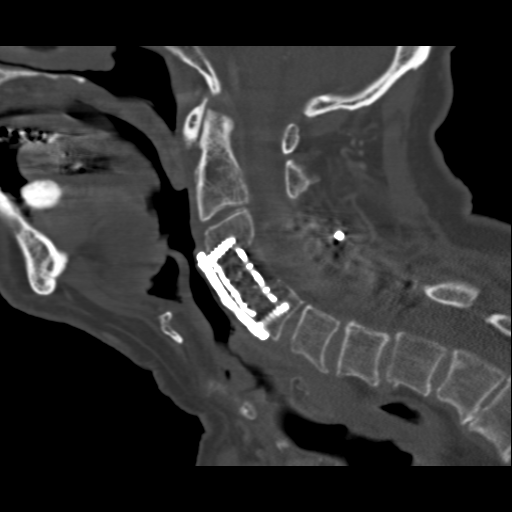
[im 41/71  bone]
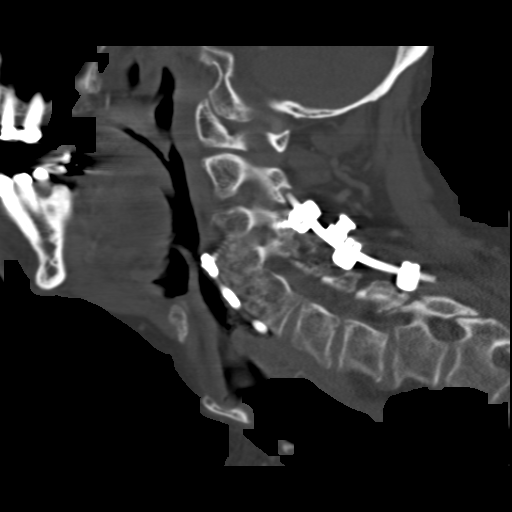
[im 47/71  bone]
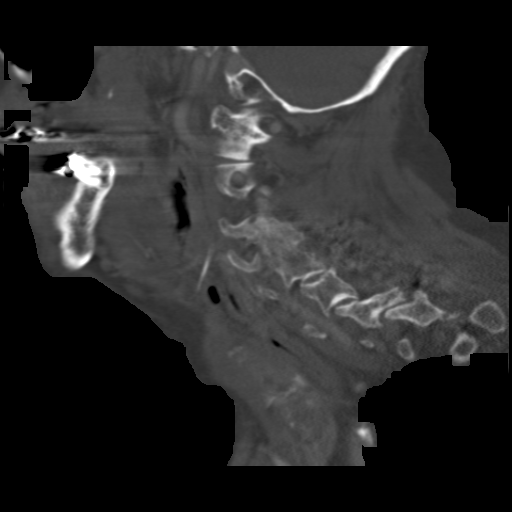

[13 of 27 positions shown; findings below may reference images not displayed]

FINDINGS: The patient has undergone previous anterior cervical fusion from C3.
Through C5. Posterior fusion has occurred at these levels with a
pedicle screw place through C7 with connecting rods to the levels
above. The metallic hardware lies approximately 2 mm deep to the
posterior lower hypopharynx shield -upper esophageal walls. There is
no evidence of perforation. The laryngeal structures and the
epiglottis appear normal. The airway is patent. There is no evidence
of abscess or diverticulum formation. The observed portions of the
thyroid gland are unremarkable.

More superiorly within the neck the parotid and submandibular glands
are normal. The jugular and carotid vessels exhibit no acute
abnormalities. There is no lymphadenopathy.

The pulmonary apices are clear.
IMPRESSION: 1. The anterior fusion plate extending from C3-C5 is immediately
deep to the lower hypopharyngeal-upper esophageal wall and may be
impacting the muscular function as suspected clinically. There is no
evidence of perforation or abscess formation.
2. There is no acute abnormality within the neck.

## 2016-06-10 ENCOUNTER — Encounter: Payer: Self-pay | Admitting: *Deleted

## 2017-04-16 IMAGING — DX DG ANKLE COMPLETE 3+V*R*
3 series · 3 of 3 positions shown · non-contrast
Comparison: None.

CLINICAL DATA: Fall yesterday with right ankle pain, initial
encounter

EXAM:
RIGHT ANKLE - COMPLETE 3+ VIEW

[ankle ap]
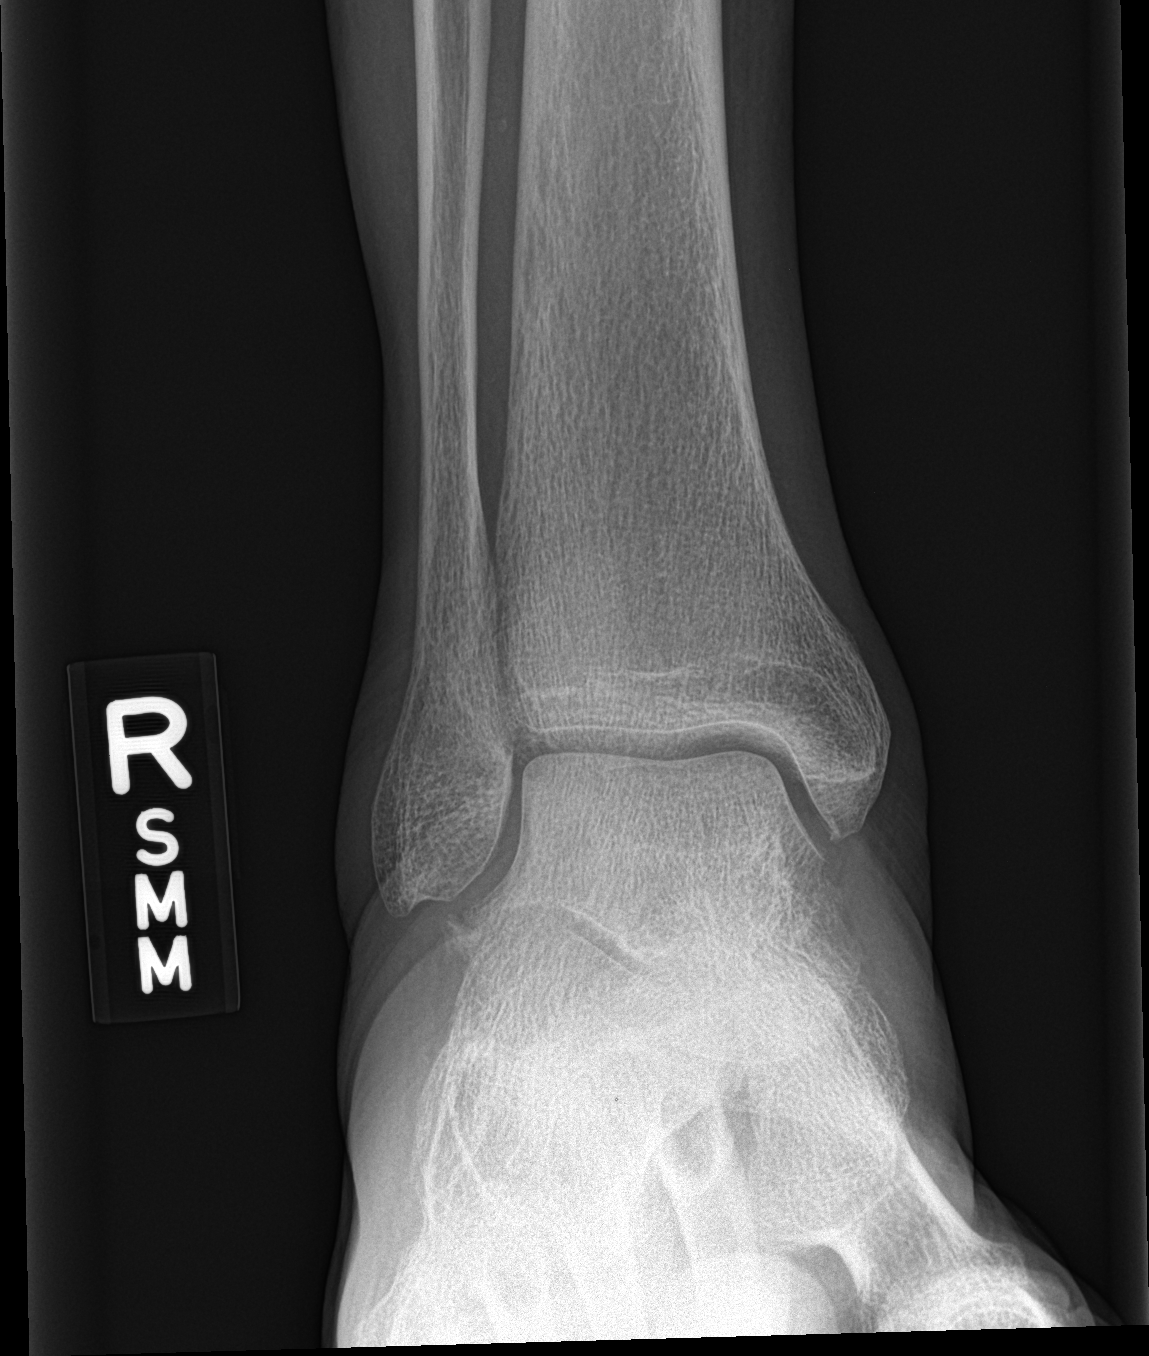

[ankle obl]
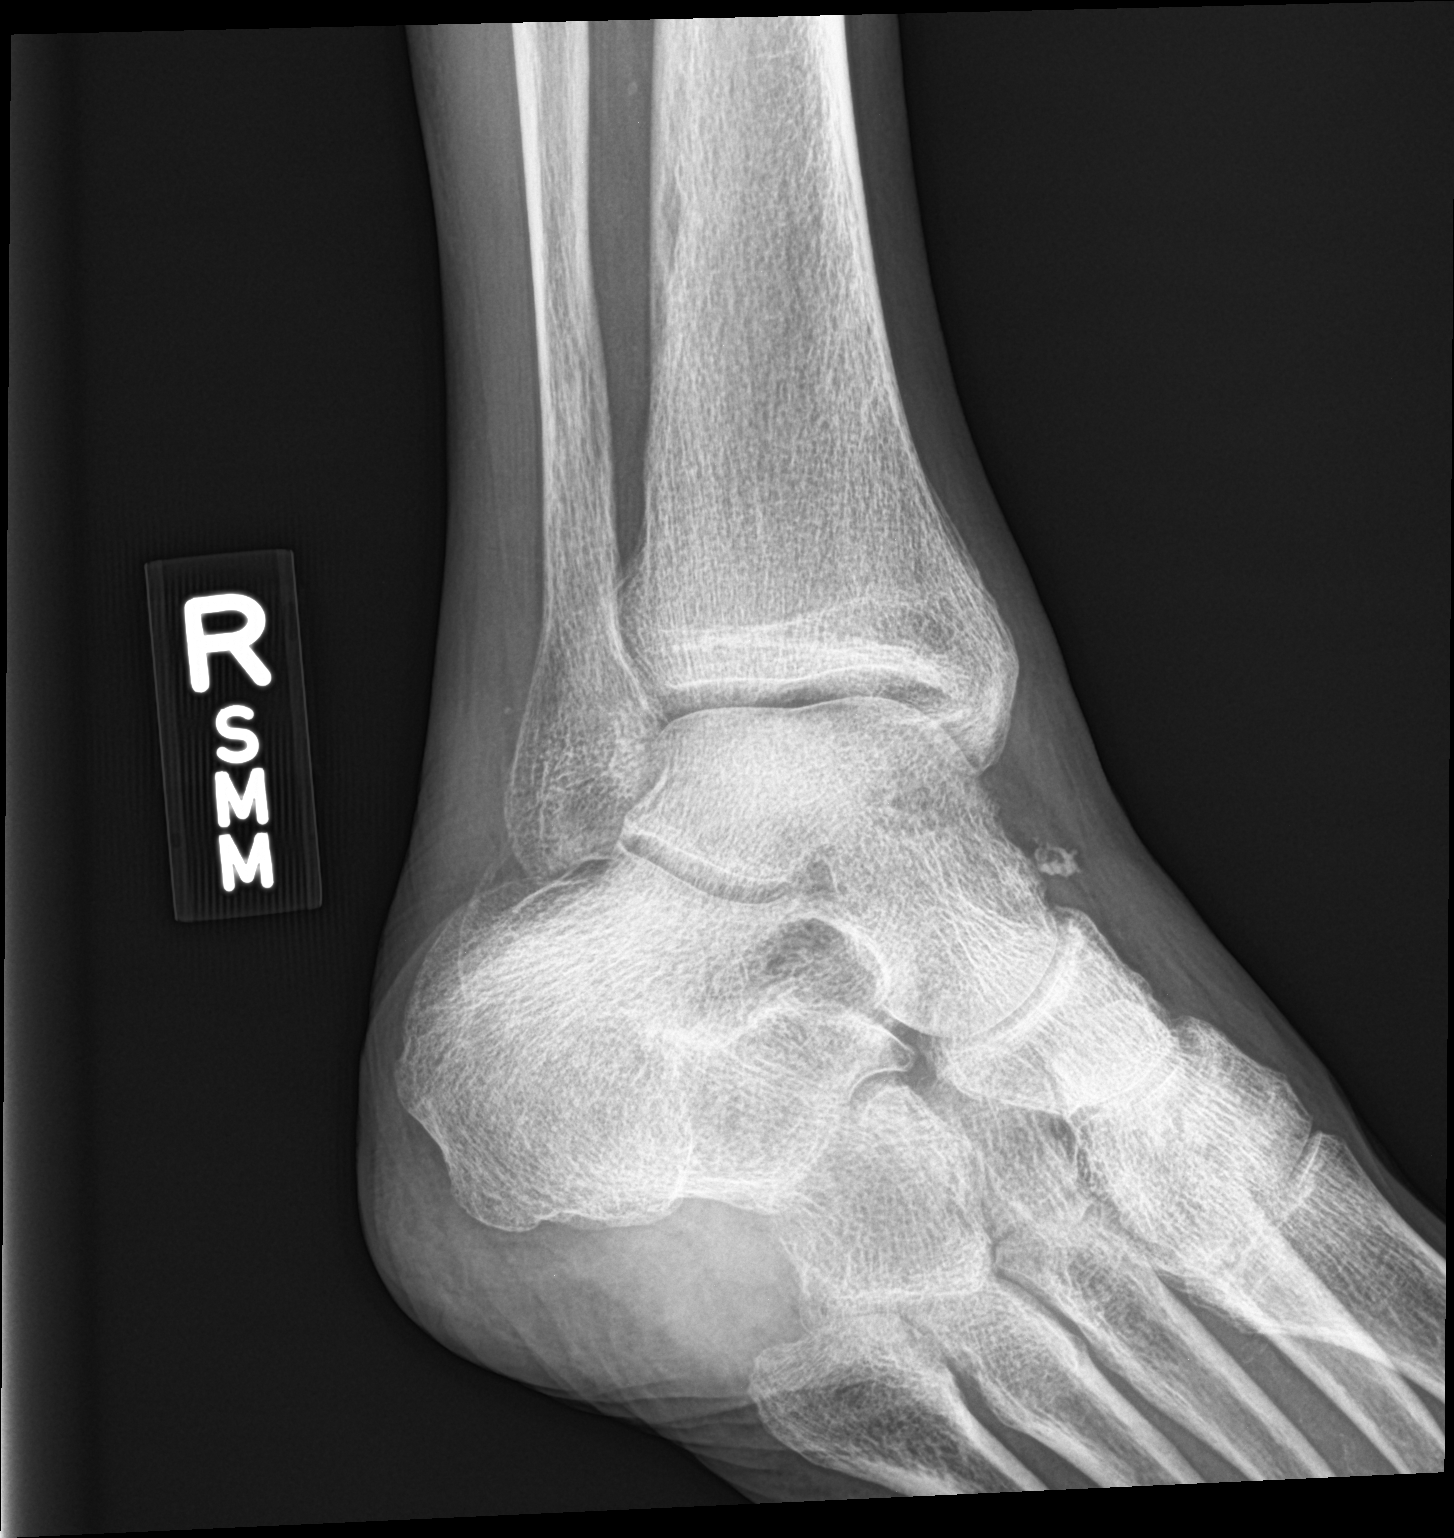

[ankle lat]
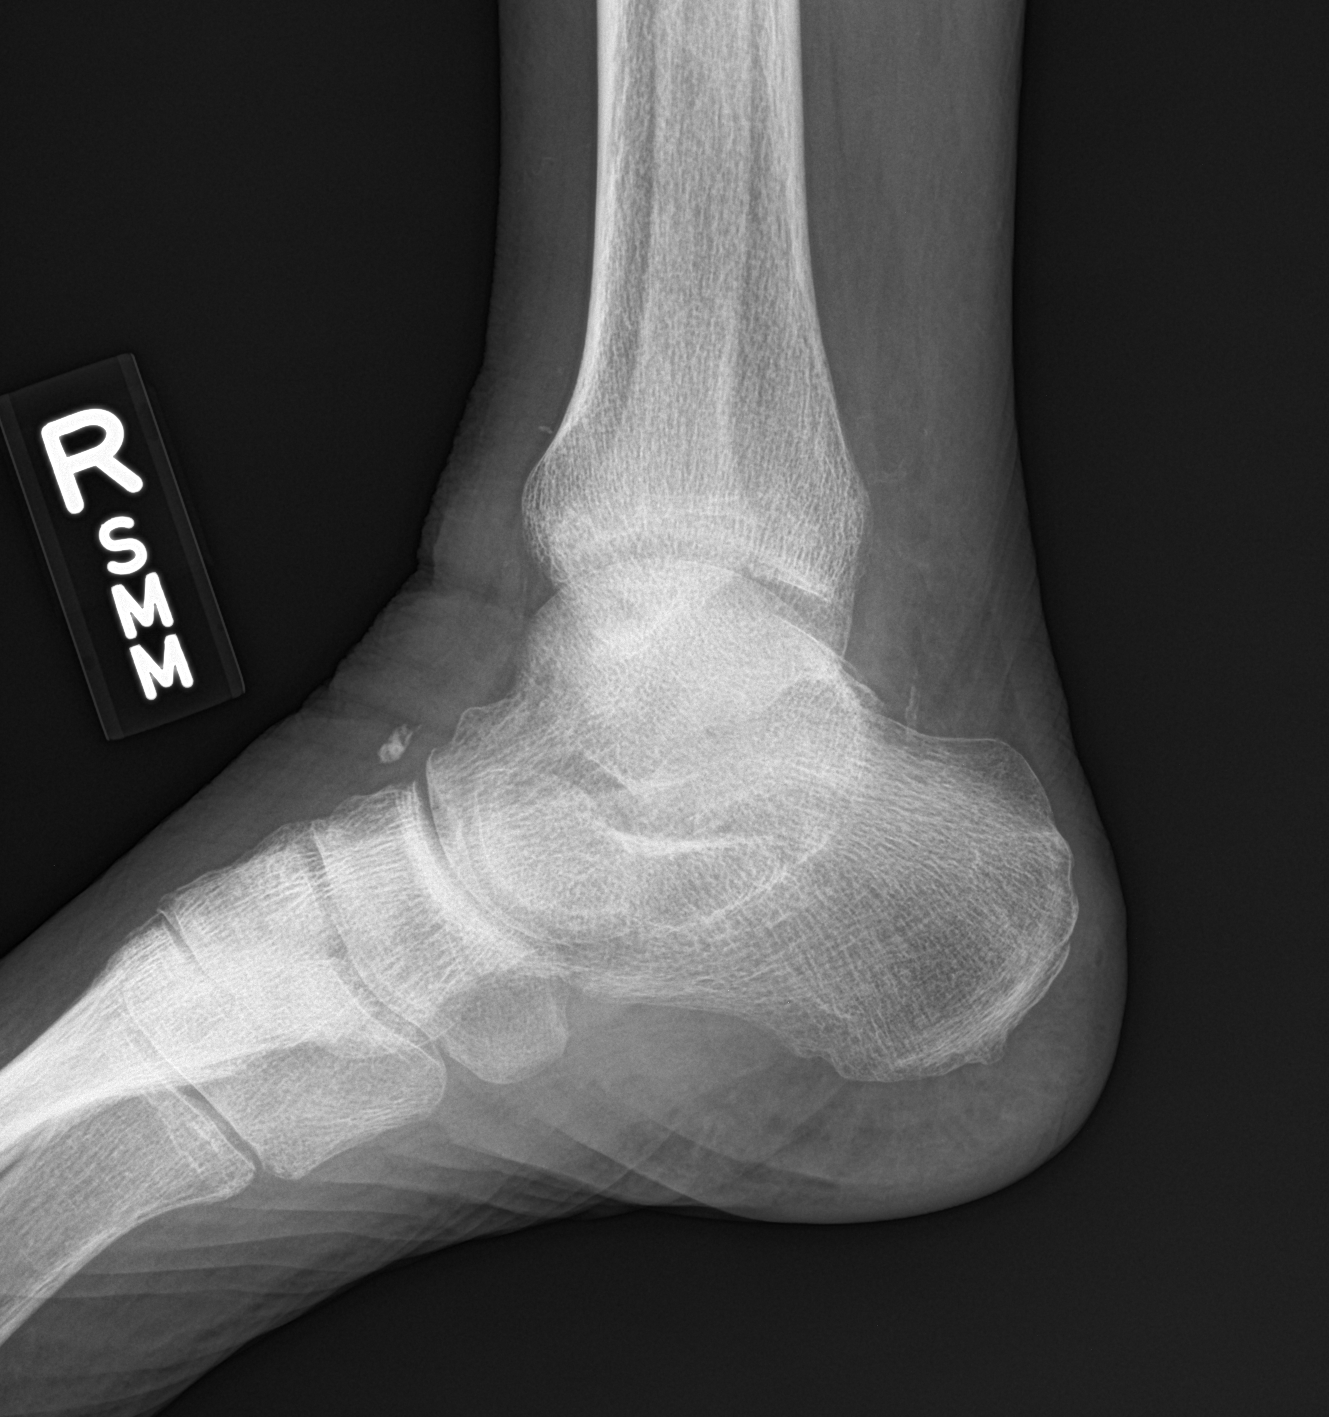

[3 of 3 positions shown; findings below may reference images not displayed]

FINDINGS: No acute fracture or dislocation is noted. A well corticated
calcification is noted in the anterior aspect of the foot which may
be related to prior avulsion fracture. No acute abnormality is seen.
IMPRESSION: No acute abnormality noted.
